# Patient Record
Sex: Female | Born: 1985 | Race: Black or African American | Hispanic: No | Marital: Single | Smoking: Current every day smoker
Health system: Southern US, Community
[De-identification: ages and names within clinical notes are randomized; demographics above are authoritative.]

## PROBLEM LIST (undated history)

## (undated) ENCOUNTER — Inpatient Hospital Stay (HOSPITAL_COMMUNITY): Payer: Self-pay

## (undated) DIAGNOSIS — R519 Headache, unspecified: Secondary | ICD-10-CM

## (undated) DIAGNOSIS — J302 Other seasonal allergic rhinitis: Secondary | ICD-10-CM

## (undated) DIAGNOSIS — B009 Herpesviral infection, unspecified: Secondary | ICD-10-CM

## (undated) DIAGNOSIS — A64 Unspecified sexually transmitted disease: Secondary | ICD-10-CM

## (undated) DIAGNOSIS — R51 Headache: Secondary | ICD-10-CM

## (undated) DIAGNOSIS — D649 Anemia, unspecified: Secondary | ICD-10-CM

## (undated) HISTORY — PX: NO PAST SURGERIES: SHX2092

---
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
## 2004-03-16 ENCOUNTER — Emergency Department (HOSPITAL_COMMUNITY): Admission: EM | Admit: 2004-03-16 | Discharge: 2004-03-16 | Payer: Self-pay | Admitting: Family Medicine

## 2004-07-25 ENCOUNTER — Emergency Department (HOSPITAL_COMMUNITY): Admission: EM | Admit: 2004-07-25 | Discharge: 2004-07-25 | Payer: Self-pay | Admitting: Family Medicine

## 2004-08-11 ENCOUNTER — Emergency Department (HOSPITAL_COMMUNITY): Admission: EM | Admit: 2004-08-11 | Discharge: 2004-08-11 | Payer: Self-pay | Admitting: Family Medicine

## 2005-02-07 ENCOUNTER — Emergency Department (HOSPITAL_COMMUNITY): Admission: EM | Admit: 2005-02-07 | Discharge: 2005-02-07 | Payer: Self-pay | Admitting: Emergency Medicine

## 2005-02-08 ENCOUNTER — Inpatient Hospital Stay (HOSPITAL_COMMUNITY): Admission: AD | Admit: 2005-02-08 | Discharge: 2005-02-08 | Payer: Self-pay | Admitting: Obstetrics and Gynecology

## 2005-06-19 ENCOUNTER — Inpatient Hospital Stay (HOSPITAL_COMMUNITY): Admission: AD | Admit: 2005-06-19 | Discharge: 2005-06-19 | Payer: Self-pay | Admitting: *Deleted

## 2005-07-31 ENCOUNTER — Ambulatory Visit (HOSPITAL_COMMUNITY): Admission: RE | Admit: 2005-07-31 | Discharge: 2005-07-31 | Payer: Self-pay | Admitting: Obstetrics & Gynecology

## 2005-09-03 ENCOUNTER — Inpatient Hospital Stay (HOSPITAL_COMMUNITY): Admission: AD | Admit: 2005-09-03 | Discharge: 2005-09-03 | Payer: Self-pay | Admitting: Obstetrics & Gynecology

## 2005-11-11 LAB — ABO/RH

## 2005-12-07 ENCOUNTER — Inpatient Hospital Stay (HOSPITAL_COMMUNITY): Admission: AD | Admit: 2005-12-07 | Discharge: 2005-12-10 | Payer: Self-pay | Admitting: Obstetrics & Gynecology

## 2007-02-27 ENCOUNTER — Emergency Department (HOSPITAL_COMMUNITY): Admission: EM | Admit: 2007-02-27 | Discharge: 2007-02-28 | Payer: Self-pay | Admitting: Emergency Medicine

## 2008-12-21 ENCOUNTER — Emergency Department (HOSPITAL_COMMUNITY): Admission: EM | Admit: 2008-12-21 | Discharge: 2008-12-21 | Payer: Self-pay | Admitting: Family Medicine

## 2009-05-19 ENCOUNTER — Emergency Department (HOSPITAL_COMMUNITY): Admission: EM | Admit: 2009-05-19 | Discharge: 2009-05-19 | Payer: Self-pay | Admitting: Family Medicine

## 2009-09-10 ENCOUNTER — Emergency Department (HOSPITAL_COMMUNITY): Admission: EM | Admit: 2009-09-10 | Discharge: 2009-09-10 | Payer: Self-pay | Admitting: Emergency Medicine

## 2010-01-18 ENCOUNTER — Emergency Department (HOSPITAL_COMMUNITY): Admission: EM | Admit: 2010-01-18 | Discharge: 2010-01-19 | Payer: Self-pay | Admitting: Emergency Medicine

## 2010-03-16 ENCOUNTER — Emergency Department (HOSPITAL_COMMUNITY)
Admission: EM | Admit: 2010-03-16 | Discharge: 2010-03-16 | Payer: Self-pay | Source: Home / Self Care | Admitting: Emergency Medicine

## 2010-03-18 ENCOUNTER — Emergency Department (HOSPITAL_COMMUNITY)
Admission: EM | Admit: 2010-03-18 | Discharge: 2010-03-19 | Payer: Self-pay | Source: Home / Self Care | Admitting: Emergency Medicine

## 2010-05-24 LAB — URINALYSIS, ROUTINE W REFLEX MICROSCOPIC
Glucose, UA: NEGATIVE mg/dL
Hgb urine dipstick: NEGATIVE
Ketones, ur: NEGATIVE mg/dL
Protein, ur: NEGATIVE mg/dL
pH: 6.5 (ref 5.0–8.0)

## 2010-05-24 LAB — WET PREP, GENITAL
Trich, Wet Prep: NONE SEEN
Yeast Wet Prep HPF POC: NONE SEEN

## 2010-05-24 LAB — POCT PREGNANCY, URINE: Preg Test, Ur: NEGATIVE

## 2010-06-10 ENCOUNTER — Emergency Department (HOSPITAL_COMMUNITY): Payer: Self-pay

## 2010-06-10 ENCOUNTER — Emergency Department (HOSPITAL_COMMUNITY)
Admission: EM | Admit: 2010-06-10 | Discharge: 2010-06-10 | Disposition: A | Discharge status: Home / Self Care | Payer: Self-pay | Attending: Emergency Medicine | Admitting: Emergency Medicine

## 2010-06-10 DIAGNOSIS — M549 Dorsalgia, unspecified: Secondary | ICD-10-CM | POA: Insufficient documentation

## 2010-06-10 DIAGNOSIS — R110 Nausea: Secondary | ICD-10-CM | POA: Insufficient documentation

## 2010-06-10 DIAGNOSIS — F411 Generalized anxiety disorder: Secondary | ICD-10-CM | POA: Insufficient documentation

## 2010-06-10 DIAGNOSIS — Y92009 Unspecified place in unspecified non-institutional (private) residence as the place of occurrence of the external cause: Secondary | ICD-10-CM | POA: Insufficient documentation

## 2010-06-10 DIAGNOSIS — R233 Spontaneous ecchymoses: Secondary | ICD-10-CM | POA: Insufficient documentation

## 2010-06-10 DIAGNOSIS — H9209 Otalgia, unspecified ear: Secondary | ICD-10-CM | POA: Insufficient documentation

## 2010-06-10 DIAGNOSIS — S01309A Unspecified open wound of unspecified ear, initial encounter: Secondary | ICD-10-CM | POA: Insufficient documentation

## 2010-06-10 DIAGNOSIS — IMO0002 Reserved for concepts with insufficient information to code with codable children: Secondary | ICD-10-CM | POA: Insufficient documentation

## 2010-06-10 DIAGNOSIS — R51 Headache: Secondary | ICD-10-CM | POA: Insufficient documentation

## 2010-06-10 DIAGNOSIS — M542 Cervicalgia: Secondary | ICD-10-CM | POA: Insufficient documentation

## 2010-06-10 MED ORDER — IOHEXOL 300 MG/ML  SOLN: 75.0000 mL | Freq: Once | INTRAMUSCULAR | Status: DC | PRN

## 2010-07-29 NOTE — H&P (Signed)
Ellen Camacho, Ellen Camacho              ACCOUNT NO.:  [id number]   MEDICAL RECORD NO.:  [id number]          PATIENT TYPE:  INP   LOCATION:  9174                          FACILITY:  [facility]   PHYSICIAN:  Roseanna Rainbow, M.D.DATE OF BIRTH:  [birth year]   DATE OF ADMISSION:  12/07/2005  DATE OF DISCHARGE:                                HISTORY & PHYSICAL   CHIEF COMPLAINT:  The patient is a 25 year old para 2 with an estimated date  of confinement of December 06, 2005, with an intrauterine pregnancy at 40+  weeks for induction of labor.   HISTORY OF PRESENT ILLNESS:  Please see the above.   ALLERGIES:  No known drug allergies.   MEDICATIONS:  Prenatal vitamins.   RISK FACTORS:  Tobacco use, GBS positive.   PRENATAL LABORATORY:  Please see the above.  Blood type is O, Rh type is  positive, antibody screen negative.  Hemoglobin 11.5; hematocrit 36.2;  platelets 247,000.  Chlamydia negative.  GC negative.  Urine culture and  sensitivity no growth.  One-hour GCT 91.  Hepatitis B surface antigen  negative.  HIV nonreactive.  RPR nonreactive.  Rubella immune.  Sickle cell  negative.  Pap smear within normal limits.   PAST GYNECOLOGICAL HISTORY:  History of chlamydia.   PAST MEDICAL HISTORY:  She denies.   PAST SURGICAL HISTORY:  No previous surgery.   SOCIAL HISTORY:  Unemployed, single.  Does not give any significant history  of alcohol usage.  Smokes less than one-half pack per day, has smoked for 5  years.  Previously has used marijuana.   FAMILY HISTORY:  Breast cancer, hypertension.   PAST OBSTETRICAL HISTORY:  In September 2003 she was delivered of a live-  born female, weight 8 pounds, vaginal delivery, no complications.  In August  2002 she was delivered of a live-born female, 7 pounds 7 ounces, vaginal  delivery, no complications.   PHYSICAL EXAMINATION:  VITAL SIGNS:  Stable, afebrile.  Fetal heart tracing  reassuring.  Tocodynamometer irregular uterine contractions.  PELVIC:  Sterile vaginal exam is per the R.N.  The cervix is fingertip and  50%.   ASSESSMENT:  Multipara at term, unfavorable Bishop's score.  Fetal heart  tracing consistent with fetal well-being.  For induction of labor.  GBS  positive.   PLAN:  Admission, two-stage induction.  Will begin with cervical ripening  with prostaglandins to be followed by Pitocin and AROM.  PCN GBS  prophylaxis.   ADDENDUM TO THE ASSESSMENT:      Roseanna Rainbow, M.D.  Electronically Signed     LAJ/MEDQ  D:  12/08/2005  T:  12/08/2005  Job:  [id number]

## 2010-10-31 ENCOUNTER — Emergency Department (HOSPITAL_COMMUNITY)
Admission: EM | Admit: 2010-10-31 | Discharge: 2010-10-31 | Disposition: A | Discharge status: Home / Self Care | Payer: Medicaid Other | Attending: Emergency Medicine | Admitting: Emergency Medicine

## 2010-10-31 DIAGNOSIS — IMO0001 Reserved for inherently not codable concepts without codable children: Secondary | ICD-10-CM | POA: Insufficient documentation

## 2010-10-31 DIAGNOSIS — J020 Streptococcal pharyngitis: Secondary | ICD-10-CM | POA: Insufficient documentation

## 2010-10-31 DIAGNOSIS — H9209 Otalgia, unspecified ear: Secondary | ICD-10-CM | POA: Insufficient documentation

## 2010-10-31 DIAGNOSIS — R6883 Chills (without fever): Secondary | ICD-10-CM | POA: Insufficient documentation

## 2010-10-31 DIAGNOSIS — R1310 Dysphagia, unspecified: Secondary | ICD-10-CM | POA: Insufficient documentation

## 2010-10-31 DIAGNOSIS — R51 Headache: Secondary | ICD-10-CM | POA: Insufficient documentation

## 2010-10-31 DIAGNOSIS — R070 Pain in throat: Secondary | ICD-10-CM | POA: Insufficient documentation

## 2010-10-31 LAB — RAPID STREP SCREEN (MED CTR MEBANE ONLY): Streptococcus, Group A Screen (Direct): POSITIVE — AB

## 2010-12-16 LAB — URINALYSIS, ROUTINE W REFLEX MICROSCOPIC
Bilirubin Urine: NEGATIVE
Ketones, ur: NEGATIVE
Nitrite: NEGATIVE
Specific Gravity, Urine: 1.011
Urobilinogen, UA: 0.2
pH: 6.5

## 2010-12-16 LAB — URINE MICROSCOPIC-ADD ON

## 2010-12-16 LAB — URINE CULTURE

## 2010-12-16 LAB — PREGNANCY, URINE: Preg Test, Ur: NEGATIVE

## 2011-03-24 ENCOUNTER — Encounter (HOSPITAL_COMMUNITY): Payer: Self-pay

## 2011-03-24 ENCOUNTER — Emergency Department (INDEPENDENT_AMBULATORY_CARE_PROVIDER_SITE_OTHER)
Admission: EM | Admit: 2011-03-24 | Discharge: 2011-03-24 | Disposition: A | Discharge status: Home / Self Care | Payer: Self-pay | Source: Home / Self Care | Attending: Family Medicine | Admitting: Family Medicine

## 2011-03-24 DIAGNOSIS — K612 Anorectal abscess: Secondary | ICD-10-CM

## 2011-03-24 DIAGNOSIS — K610 Anal abscess: Secondary | ICD-10-CM

## 2011-03-24 LAB — CULTURE, ROUTINE-ABSCESS

## 2011-03-24 MED ORDER — CLINDAMYCIN HCL 150 MG PO CAPS: 150.0000 mg | ORAL_CAPSULE | Freq: Four times a day (QID) | ORAL | Status: AC

## 2011-03-24 MED ORDER — OXYCODONE HCL 5 MG PO TABS: 5.0000 mg | ORAL_TABLET | Freq: Four times a day (QID) | ORAL | Status: AC | PRN

## 2011-03-24 MED ORDER — PRENATAL COMPLETE 14-0.4 MG PO TABS: 1.0000 | ORAL_TABLET | Freq: Every day | ORAL | Status: DC

## 2011-03-24 NOTE — ED Notes (Signed)
Hist of previous boil on buttocks, started having pain in same area; reddened, swollen, painful to palpation

## 2011-03-31 NOTE — ED Notes (Signed)
Abscess culture perirectal: Few streptococcus group C- no sensitivity on report. Pt. treated with Cleocin.  1/17  Lab shown to Dr. Ladon Applebaum and he said it was contaminant and tx. is adequate.  No further action needed. Ellen Camacho 03/31/2011

## 2011-04-25 NOTE — ED Provider Notes (Signed)
History     CSN: [id number]  Arrival date & time 03/24/11  1647   First MD Initiated Contact with Patient 03/24/11 1703      Chief Complaint  Patient presents with  . Recurrent Skin Infections    (Consider location/radiation/quality/duration/timing/severity/associated sxs/prior treatment) Patient is a 26 y.o. female presenting with abscess. The history is provided by the patient.  Abscess  This is a new problem. The current episode started today. The problem has been unchanged. The abscess is present on the left buttock.    History reviewed. No pertinent past medical history.  History reviewed. No pertinent past surgical history.  History reviewed. No pertinent family history.  History  Substance Use Topics  . Smoking status: Not on file  . Smokeless tobacco: Not on file  . Alcohol Use:     OB History    Grav Para Term Preterm Abortions TAB SAB Ect Mult Living                  Review of Systems  Allergies  Review of patient's allergies indicates no known allergies.  Home Medications   Current Outpatient Rx  Name Route Sig Dispense Refill  . PRENATAL COMPLETE 14-0.4 MG PO TABS Oral Take 1 tablet by mouth daily. 30 each 0    BP 121/81  Pulse 99  Temp(Src) 98.9 F (37.2 C) (Oral)  Resp 18  SpO2 100%  LMP 02/09/2011  Physical Exam  Nursing note and vitals reviewed. Constitutional: She is oriented to person, place, and time. She appears well-developed and well-nourished.  HENT:  Head: Normocephalic and atraumatic.  Eyes: EOM are normal.  Neck: Normal range of motion.  Pulmonary/Chest: Effort normal.  Genitourinary:     Musculoskeletal: Normal range of motion.  Neurological: She is alert and oriented to person, place, and time.  Skin: Skin is warm and dry.  Psychiatric: Her behavior is normal.    ED Course  INCISION AND DRAINAGE Performed by: Richardo Priest Authorized by: Richardo Priest Consent: Verbal consent obtained. Consent given by:  patient Patient understanding: patient states understanding of the procedure being performed Type: abscess Body area: anogenital Location details: perianal Anesthesia: local infiltration Local anesthetic: lidocaine 2% without epinephrine Anesthetic total: 3 ml Scalpel size: 11 Needle gauge: 27. Incision type: single straight Complexity: simple Drainage: purulent Drainage amount: moderate Packing material: 1/4 in iodoform gauze   (including critical care time)   Labs Reviewed  POCT PREGNANCY, URINE  CULTURE, ROUTINE-ABSCESS  LAB REPORT - SCANNED   No results found.   1. Perianal abscess       MDM  S/p I&D; start antibiotics, return for re-evaluation in 48 hours for evaluation        Richardo Priest, MD 04/25/11 1904

## 2011-05-01 ENCOUNTER — Inpatient Hospital Stay (HOSPITAL_COMMUNITY)
Admission: AD | Admit: 2011-05-01 | Discharge: 2011-05-01 | Disposition: A | Discharge status: Home / Self Care | Payer: Medicaid Other | Source: Ambulatory Visit | Attending: Family Medicine | Admitting: Family Medicine

## 2011-05-01 ENCOUNTER — Encounter (HOSPITAL_COMMUNITY): Payer: Self-pay | Admitting: *Deleted

## 2011-05-01 DIAGNOSIS — N912 Amenorrhea, unspecified: Secondary | ICD-10-CM | POA: Insufficient documentation

## 2011-05-01 DIAGNOSIS — Z3201 Encounter for pregnancy test, result positive: Secondary | ICD-10-CM | POA: Insufficient documentation

## 2011-05-01 DIAGNOSIS — Z3200 Encounter for pregnancy test, result unknown: Secondary | ICD-10-CM

## 2011-05-01 HISTORY — DX: Anemia, unspecified: D64.9

## 2011-05-01 LAB — URINE MICROSCOPIC-ADD ON

## 2011-05-01 LAB — CBC
MCH: 28.4 pg (ref 26.0–34.0)
MCHC: 33.7 g/dL (ref 30.0–36.0)
Platelets: 220 10*3/uL (ref 150–400)
RDW: 14.3 % (ref 11.5–15.5)

## 2011-05-01 LAB — URINALYSIS, ROUTINE W REFLEX MICROSCOPIC
Bilirubin Urine: NEGATIVE
Ketones, ur: NEGATIVE mg/dL
Leukocytes, UA: NEGATIVE
Nitrite: NEGATIVE
Protein, ur: NEGATIVE mg/dL
Urobilinogen, UA: 0.2 mg/dL (ref 0.0–1.0)

## 2011-05-01 LAB — COMPREHENSIVE METABOLIC PANEL
ALT: 18 U/L (ref 0–35)
Albumin: 3.7 g/dL (ref 3.5–5.2)
Calcium: 9.3 mg/dL (ref 8.4–10.5)
GFR calc Af Amer: >90 mL/min (ref >90)
Glucose, Bld: 110 mg/dL — ABNORMAL HIGH (ref 70–99)
Potassium: 4 mEq/L (ref 3.5–5.1)
Sodium: 133 mEq/L — ABNORMAL LOW (ref 135–145)
Total Protein: 7.2 g/dL (ref 6.0–8.3)

## 2011-05-01 MED ORDER — CLINDAMYCIN HCL 300 MG PO CAPS: 300.0000 mg | ORAL_CAPSULE | Freq: Three times a day (TID) | ORAL | Status: AC

## 2011-05-01 NOTE — Progress Notes (Signed)
S:  Upon discharge pt reports a "boil" in area of buttocks that opened yesterday O: Small open lesion on left buttocks; no redness seen around, no abnormal discharge; healing over A:  Boil P:  RX Clindamycin 300 mg TID X 7 days  F/U if no improvement of symptoms. [facility]

## 2011-05-01 NOTE — ED Provider Notes (Signed)
History     Chief Complaint  Patient presents with  . Amenorrhea   HPI  Pt in to get pregnancy verification letter. Also reports one episode of "blacking out" today.  While watching TV, felt nauseous, when opening door to bathroom became really hot feeling and slid down to floor.  Felt better after drinking water.  Reports nausea since finding out pregnant, no vomiting today.  Last vomiting was 2 days ago. Denies vaginal bleeding or abdominal pain.  No abnormal vaginal discharge reported.     Past Medical History  Diagnosis Date  . Anemia     Past Surgical History  Procedure Date  . No past surgeries     Family History  Problem Relation Age of Onset  . Cancer Mother   . Diabetes Mother   . Hypertension Mother   . Heart disease Mother   . Stroke Brother   . Cancer Maternal Grandfather     History  Substance Use Topics  . Smoking status: Current Everyday Smoker    Types: Cigarettes  . Smokeless tobacco: Not on file  . Alcohol Use: No    Allergies: No Known Allergies  No prescriptions prior to admission    Review of Systems  Neurological: Positive for weakness.  All other systems reviewed and are negative.   Physical Exam   Blood pressure 121/71, pulse 97, temperature 98.4 F (36.9 C), temperature source Oral, resp. rate 20, height 5\' 6"  (1.676 m), weight 55.792 kg (123 lb), last menstrual period 02/08/2011, SpO2 100.00%.  Physical Exam  Constitutional: She is oriented to person, place, and time. She appears well-developed and well-nourished. No distress.  HENT:  Head: Normocephalic.  Neck: Normal range of motion. Neck supple.  Cardiovascular: Normal rate, regular rhythm and normal heart sounds.   Respiratory: Effort normal and breath sounds normal. No respiratory distress.  GI: Soft. There is no tenderness. There is no CVA tenderness.  Genitourinary: Uterus is enlarged. Cervix exhibits no motion tenderness and no discharge.       Fundal height 3 above  pelvis  Musculoskeletal: Normal range of motion.  Neurological: She is alert and oriented to person, place, and time.  Skin: Skin is warm and dry.  Psychiatric: She has a normal mood and affect.   Doppler FHT's MAU Course  Procedures  Results for orders placed during the hospital encounter of 05/01/11 (from the past 24 hour(s))  URINALYSIS, ROUTINE W REFLEX MICROSCOPIC     Status: Abnormal   Collection Time   05/01/11  3:05 PM      Component Value Range   Color, Urine YELLOW  YELLOW    APPearance HAZY (*) CLEAR    Specific Gravity, Urine 1.025  1.005 - 1.030    pH 6.0  5.0 - 8.0    Glucose, UA NEGATIVE  NEGATIVE (mg/dL)   Hgb urine dipstick TRACE (*) NEGATIVE    Bilirubin Urine NEGATIVE  NEGATIVE    Ketones, ur NEGATIVE  NEGATIVE (mg/dL)   Protein, ur NEGATIVE  NEGATIVE (mg/dL)   Urobilinogen, UA 0.2  0.0 - 1.0 (mg/dL)   Nitrite NEGATIVE  NEGATIVE    Leukocytes, UA NEGATIVE  NEGATIVE   URINE MICROSCOPIC-ADD ON     Status: Abnormal   Collection Time   05/01/11  3:05 PM      Component Value Range   Squamous Epithelial / LPF FEW (*) RARE    WBC, UA 0-2  <3 (WBC/hpf)   RBC / HPF 7-10  <3 (RBC/hpf)  Bacteria, UA FEW (*) RARE    Crystals CA OXALATE CRYSTALS (*) NEGATIVE    Urine-Other MUCOUS PRESENT    CBC     Status: Abnormal   Collection Time   05/01/11  4:10 PM      Component Value Range   WBC 10.7 (*) 4.0 - 10.5 (K/uL)   RBC 4.19  3.87 - 5.11 (MIL/uL)   Hemoglobin 11.9 (*) 12.0 - 15.0 (g/dL)   HCT 16.1 (*) 09.6 - 46.0 (%)   MCV 84.2  78.0 - 100.0 (fL)   MCH 28.4  26.0 - 34.0 (pg)   MCHC 33.7  30.0 - 36.0 (g/dL)   RDW 04.5  40.9 - 81.1 (%)   Platelets 220  150 - 400 (K/uL)  COMPREHENSIVE METABOLIC PANEL     Status: Abnormal   Collection Time   05/01/11  4:10 PM      Component Value Range   Sodium 133 (*) 135 - 145 (mEq/L)   Potassium 4.0  3.5 - 5.1 (mEq/L)   Chloride 99  96 - 112 (mEq/L)   CO2 25  19 - 32 (mEq/L)   Glucose, Bld 110 (*) 70 - 99 (mg/dL)   BUN 12  6  - 23 (mg/dL)   Creatinine, Ser 9.14  0.50 - 1.10 (mg/dL)   Calcium 9.3  8.4 - 78.2 (mg/dL)   Total Protein 7.2  6.0 - 8.3 (g/dL)   Albumin 3.7  3.5 - 5.2 (g/dL)   AST 18  0 - 37 (U/L)   ALT 18  0 - 35 (U/L)   Alkaline Phosphatase 59  39 - 117 (U/L)   Total Bilirubin 0.2 (*) 0.3 - 1.2 (mg/dL)   GFR calc non Af Amer >90  >90 (mL/min)   GFR calc Af Amer >90  >90 (mL/min)     Assessment and Plan  Pregnancy  Plan: -DC to home -Begin prenatal care   [facility] 05/01/2011, 3:54 PM

## 2011-05-01 NOTE — Progress Notes (Signed)
Pt in to get pregnancy verification letter.  Also reports one episode of "blacking out" today.  Reports nausea since finding out pregnant, no vomiting 2 days ago.  Hit back on chair, but did slide down to floor.  Has had one episode of blacking out before but 9 years ago, but was not pregnant.  Hx of anemia, no bleeding.

## 2011-05-01 NOTE — ED Notes (Signed)
When called pt from lobby.  Was leaning over picking things up off floor, gathering belongings. Steady on feet, did not reach out for assistance or to steady self, gait smooth and steady.

## 2011-05-01 NOTE — Progress Notes (Signed)
In Jan, went to urgent care for a boil- found out was preg.  Boil was taken care of, went to get medicaid switched over and was told need proof of preg  With edc.  Not first preg. Has been dizzy at times, actually passed out today, every thing went black.. Was walking reached for door, things started going so sat down on floor, when came to was laying on the floor.  Got up drank some water and was fine.  Passed out several years ago, when not preg.

## 2011-05-01 NOTE — Discharge Instructions (Signed)
Prenatal Care Providers °[facility]    [facility] ° Phone- [phone]     Phone: [phone] °         °[facility]                      [facility] ° @[place]     Phone: [phone] ° Phone: [phone] °        [facility] °[facility]     Phone: [phone] ° Phone: [phone]   °        [facility] °[facility] [place]                hone: [phone] ° Phone: [phone] °        [facility] °Dr. Bernard Marshall      Phone: [phone] ° Phone: [phone] °        [facility] °[facility]                Phone: [phone] ° [facility]  ° Phone:[phone]    [facility] °         Phone: [phone] °[facility] °  Phone: [phone] °

## 2011-05-02 NOTE — ED Provider Notes (Signed)
Chart reviewed and agree with management and plan.  

## 2011-05-13 ENCOUNTER — Encounter (HOSPITAL_COMMUNITY): Payer: Self-pay

## 2011-05-13 ENCOUNTER — Inpatient Hospital Stay (HOSPITAL_COMMUNITY)
Admission: AD | Admit: 2011-05-13 | Discharge: 2011-05-13 | Disposition: A | Discharge status: Home Health | Payer: Medicaid Other | Source: Ambulatory Visit | Attending: Obstetrics & Gynecology | Admitting: Obstetrics & Gynecology

## 2011-05-13 DIAGNOSIS — R109 Unspecified abdominal pain: Secondary | ICD-10-CM | POA: Insufficient documentation

## 2011-05-13 DIAGNOSIS — O209 Hemorrhage in early pregnancy, unspecified: Secondary | ICD-10-CM | POA: Insufficient documentation

## 2011-05-13 DIAGNOSIS — O26859 Spotting complicating pregnancy, unspecified trimester: Secondary | ICD-10-CM

## 2011-05-13 LAB — URINALYSIS, ROUTINE W REFLEX MICROSCOPIC
Glucose, UA: NEGATIVE mg/dL
Leukocytes, UA: NEGATIVE
Protein, ur: NEGATIVE mg/dL
Specific Gravity, Urine: 1.025 (ref 1.005–1.030)
pH: 6 (ref 5.0–8.0)

## 2011-05-13 LAB — WET PREP, GENITAL: Trich, Wet Prep: NONE SEEN

## 2011-05-13 LAB — URINE MICROSCOPIC-ADD ON

## 2011-05-13 MED ORDER — METRONIDAZOLE 500 MG PO TABS: 500.0000 mg | ORAL_TABLET | Freq: Two times a day (BID) | ORAL | Status: AC

## 2011-05-13 NOTE — Progress Notes (Signed)
Patient is here with c/o sudden onset of lower abdominal cramping and vaginal bleeding that started about an hour.

## 2011-05-13 NOTE — Progress Notes (Signed)
Pt states, " Less than an hour ago I went to the bathroom and wiped and saw blood on the paper. I had low back pain all day and now it has moved to my low abdomen and into my upper thighs.'

## 2011-05-13 NOTE — ED Provider Notes (Signed)
History     Chief Complaint  Patient presents with  . Vaginal Bleeding  . Abdominal Pain   HPI 26 y.o. [id number] at [redacted]w[redacted]d with onset of cramping and vaginal bleeding about 1 hour ago, at first patient reports "a lot" of bleeding with wiping, now states bleeding is lighter. No recent intercourse. Blood type O pos by prior records.    Past Medical History  Diagnosis Date  . Anemia     Past Surgical History  Procedure Date  . No past surgeries     Family History  Problem Relation Age of Onset  . Cancer Mother   . Diabetes Mother   . Hypertension Mother   . Heart disease Mother   . Stroke Brother   . Cancer Maternal Grandfather     History  Substance Use Topics  . Smoking status: Current Everyday Smoker    Types: Cigarettes  . Smokeless tobacco: Not on file  . Alcohol Use: No    Allergies: No Known Allergies  No prescriptions prior to admission    Review of Systems  Constitutional: Negative.   Respiratory: Negative.   Cardiovascular: Negative.   Gastrointestinal: Negative for nausea, vomiting, abdominal pain, diarrhea and constipation.  Genitourinary: Negative for dysuria, urgency, frequency, hematuria and flank pain.       Positive for cramping and bleeding  Musculoskeletal: Negative.   Neurological: Negative.   Psychiatric/Behavioral: Negative.    Physical Exam   Blood pressure 106/70, pulse 88, temperature 98.4 F (36.9 C), temperature source Oral, resp. rate 2, height 5' 5.25" (1.657 m), weight 129 lb (58.514 kg), last menstrual period 02/08/2011.  Physical Exam  Nursing note and vitals reviewed. Constitutional: She is oriented to person, place, and time. She appears well-developed and well-nourished. No distress.  HENT:  Head: Normocephalic and atraumatic.  Cardiovascular: Normal rate.   Respiratory: Effort normal.  GI: Soft. Bowel sounds are normal. She exhibits no mass. There is no tenderness. There is no rebound and no guarding.  Genitourinary:  There is no rash or lesion on the right labia. There is no rash or lesion on the left labia. Uterus is not tender. Enlarged: Size c/w dates. Cervix exhibits no motion tenderness, no discharge and no friability. Right adnexum displays no mass, no tenderness and no fullness. Left adnexum displays no mass, no tenderness and no fullness. There is bleeding (small) around the vagina. No tenderness around the vagina. No vaginal discharge found.       Cervix long and closed  Musculoskeletal: Normal range of motion.  Neurological: She is alert and oriented to person, place, and time.  Skin: Skin is warm and dry.  Psychiatric: She has a normal mood and affect.    MAU Course  Procedures  Results for orders placed during the hospital encounter of 05/13/11 (from the past 24 hour(s))  URINALYSIS, ROUTINE W REFLEX MICROSCOPIC     Status: Abnormal   Collection Time   05/13/11  1:54 AM      Component Value Range   Color, Urine YELLOW  YELLOW    APPearance CLEAR  CLEAR    Specific Gravity, Urine 1.025  1.005 - 1.030    pH 6.0  5.0 - 8.0    Glucose, UA NEGATIVE  NEGATIVE (mg/dL)   Hgb urine dipstick LARGE (*) NEGATIVE    Bilirubin Urine NEGATIVE  NEGATIVE    Ketones, ur NEGATIVE  NEGATIVE (mg/dL)   Protein, ur NEGATIVE  NEGATIVE (mg/dL)   Urobilinogen, UA 0.2  0.0 - 1.0 (  mg/dL)   Nitrite NEGATIVE  NEGATIVE    Leukocytes, UA NEGATIVE  NEGATIVE   URINE MICROSCOPIC-ADD ON     Status: Abnormal   Collection Time   05/13/11  1:54 AM      Component Value Range   Squamous Epithelial / LPF FEW (*) RARE    WBC, UA 3-6  <3 (WBC/hpf)   RBC / HPF 7-10  <3 (RBC/hpf)   Bacteria, UA FEW (*) RARE   WET PREP, GENITAL     Status: Abnormal   Collection Time   05/13/11  2:15 AM      Component Value Range   Yeast Wet Prep HPF POC NONE SEEN  NONE SEEN    Trich, Wet Prep NONE SEEN  NONE SEEN    Clue Cells Wet Prep HPF POC FEW (*) NONE SEEN    WBC, Wet Prep HPF POC FEW (*) NONE SEEN      Assessment and Plan  26 y.o.  [id number] at [redacted]w[redacted]d Rx flagyl F/U for prenatal care as soon as possible   Demarco Bacci 05/13/2011, 2:38 AM

## 2011-05-15 LAB — GC/CHLAMYDIA PROBE AMP, GENITAL
Chlamydia, DNA Probe: NEGATIVE
GC Probe Amp, Genital: NEGATIVE

## 2011-05-21 ENCOUNTER — Inpatient Hospital Stay (HOSPITAL_COMMUNITY): Payer: Medicaid Other

## 2011-05-21 ENCOUNTER — Encounter (HOSPITAL_COMMUNITY): Payer: Self-pay | Admitting: *Deleted

## 2011-05-21 ENCOUNTER — Inpatient Hospital Stay (HOSPITAL_COMMUNITY)
Admission: AD | Admit: 2011-05-21 | Discharge: 2011-05-22 | Disposition: A | Discharge status: Home Health | Payer: Medicaid Other | Source: Ambulatory Visit | Attending: Obstetrics and Gynecology | Admitting: Obstetrics and Gynecology

## 2011-05-21 DIAGNOSIS — O4690 Antepartum hemorrhage, unspecified, unspecified trimester: Secondary | ICD-10-CM

## 2011-05-21 DIAGNOSIS — O209 Hemorrhage in early pregnancy, unspecified: Secondary | ICD-10-CM | POA: Insufficient documentation

## 2011-05-21 NOTE — Progress Notes (Signed)
Pt G4 P3 at 14.4wks, having vag bleeding, cramping and back pain x 3 days.  Pt taking flagyl  Since 3/4.

## 2011-05-21 NOTE — ED Provider Notes (Signed)
History   Pt presents today c/o heavier vag bleeding that has worsened over the past 3 days. She denies recent intercourse. She has no other complaints at this time.  Chief Complaint  Patient presents with  . Vaginal Bleeding  . Abdominal Cramping   HPI  OB History    Grav Para Term Preterm Abortions TAB SAB Ect Mult Living   4 3 3  0 0 0 0 0 0 3      Past Medical History  Diagnosis Date  . Anemia     Past Surgical History  Procedure Date  . No past surgeries     Family History  Problem Relation Age of Onset  . Cancer Mother   . Diabetes Mother   . Hypertension Mother   . Heart disease Mother   . Stroke Brother   . Cancer Maternal Grandfather     History  Substance Use Topics  . Smoking status: Current Everyday Smoker    Types: Cigarettes  . Smokeless tobacco: Not on file  . Alcohol Use: No    Allergies: No Known Allergies  Prescriptions prior to admission  Medication Sig Dispense Refill  . metroNIDAZOLE (FLAGYL) 500 MG tablet Take 1 tablet (500 mg total) by mouth 2 (two) times daily.  14 tablet  0    Review of Systems  Constitutional: Negative for fever and chills.  Eyes: Negative for blurred vision and double vision.  Respiratory: Negative for cough, hemoptysis, sputum production, shortness of breath and wheezing.   Cardiovascular: Negative for chest pain and palpitations.  Gastrointestinal: Positive for abdominal pain. Negative for nausea, vomiting, diarrhea, constipation and blood in stool.  Genitourinary: Negative for dysuria, urgency, frequency, hematuria and flank pain.  Neurological: Negative for dizziness and headaches.  Psychiatric/Behavioral: Negative for depression and suicidal ideas.   Physical Exam   Blood pressure 93/50, pulse 89, temperature 98.6 F (37 C), temperature source Oral, resp. rate 16, height 5\' 6"  (1.676 m), weight 133 lb (60.328 kg), last menstrual period 02/08/2011.  Physical Exam  Nursing note and vitals  reviewed. Constitutional: She is oriented to person, place, and time. She appears well-developed and well-nourished. No distress.  HENT:  Head: Atraumatic.  GI: Soft. She exhibits no distension and no mass. There is tenderness. There is no rebound and no guarding.  Genitourinary: There is bleeding around the vagina. Vaginal discharge found.       Moderate amount of dark red blood in vag vault. Cervix Lg/closed.  Neurological: She is alert and oriented to person, place, and time.  Skin: Skin is warm and dry. She is not diaphoretic.  Psychiatric: She has a normal mood and affect. Her behavior is normal. Judgment and thought content normal.    MAU Course  Procedures  US shows small to moderate subchorionic hemorrhage. Complex amniotic fluid noted and amniotic fluid is subjectively low-normal. Also fetus with echogenic bowel.  Discussed pt with Dr. Emelda Fear. Advised pt should have f/u scheduled. No treatment at this time.  Assessment and Plan  Vag bleeding: will gave pt f/u in the high risk OB clinic. She will be called with an appt. Discussed diet, activity, risks, and precautions. Discussed all findings with pt and her mother at length.   Clinton Gallant. Zen Cedillos III, DrHSc, MPAS, PA-C  05/21/2011, 11:08 PM   Harmon Pier, PA 05/22/11 0030

## 2011-05-21 NOTE — Progress Notes (Signed)
Pt c/o vag bleeding for the last three days with mod sized clots and low abd cramping

## 2011-05-22 NOTE — Discharge Instructions (Signed)
Vaginal Bleeding During Pregnancy, Second Trimester  A small amount of bleeding (spotting) is relatively common in pregnancy. It usually stops on its own. There are many causes for bleeding or spotting in pregnancy. Some bleeding may be related to the pregnancy and some may not. Cramping with the bleeding is more serious and concerning. Tell your caregiver if you have any vaginal bleeding.   CAUSES    Infection, inflammation or growths on the cervix.   The placenta may partially or completely be covering the opening of the cervix inside the uterus.   The placenta may have separated from the uterus.   You may be having early/preterm labor.   The cervix is not strong enough to keep a baby inside the uterus (cervical insufficiency).   Many tiny cysts in the uterus instead of pregnancy tissue (molar pregnancy)  SYMPTOMS    Vaginal spotting or bleeding with or without cramps.   Uterine contractions.   Abnormal vaginal discharge.   You may have spotting or spotting after having sexual intercourse.  DIAGNOSIS   To evaluate the pregnancy, your caregiver may:   Do a pelvic exam.   Take blood tests.   Do an ultrasound.  It is very important to follow your caregiver's instructions.   TREATMENT    Evaluation of the pregnancy with blood tests and ultrasound.   Bed rest (getting up to use the bathroom only).   Rho-gam immunization if the mother is Rh negative and the father is Rh positive.   If you are having uterine contractions, you may be given medication to stop the contractions.   If you have cervical insufficiency, you may have a suture placed in the cervix to close it.  HOME CARE INSTRUCTIONS    If your caregiver orders bed rest, you may need to make arrangements for the care of other children and for any other responsibilities. However, your caregiver may allow you to continue light activity.   Keep track of the number of pads you use each day and how soaked (saturated) they are. Write this down.   Do  not use tampons. Do not douche.   Do not have sexual intercourse or orgasms until approved by your physician.   Save any tissue that you pass for your caregiver to see.   Take medicine for cramps only with your caregiver's permission.   Do not take aspirin because it can make you bleed.   Do not exercise, do any strenuous activities or heavy lifting without your caregiver's permission.  SEEK IMMEDIATE MEDICAL CARE IF:    You experience severe cramps in your stomach, back or belly (abdomen).   You have uterine contractions.   You have an oral temperature above 102 F (38.9 C), not controlled by medicine.   You develop chills.   You pass large clots or tissue.   Your bleeding increases or you become light-headed, weak or have fainting episodes.   You have leaking or a gush of fluid from your vagina.  Document Released: 12/07/2004 Document Revised: 02/16/2011 Document Reviewed: 06/18/2008  [employer] Patient Information 2012 [employer].

## 2011-05-27 ENCOUNTER — Encounter (HOSPITAL_COMMUNITY): Payer: Self-pay | Admitting: *Deleted

## 2011-05-27 ENCOUNTER — Inpatient Hospital Stay (HOSPITAL_COMMUNITY)
Admission: AD | Admit: 2011-05-27 | Discharge: 2011-05-27 | Disposition: A | Discharge status: Home / Self Care | Payer: Medicaid Other | Source: Ambulatory Visit | Attending: Family Medicine | Admitting: Family Medicine

## 2011-05-27 DIAGNOSIS — O039 Complete or unspecified spontaneous abortion without complication: Secondary | ICD-10-CM

## 2011-05-27 HISTORY — DX: Unspecified sexually transmitted disease: A64

## 2011-05-27 LAB — CBC
Hemoglobin: 11.5 g/dL — ABNORMAL LOW (ref 12.0–15.0)
MCHC: 33.6 g/dL (ref 30.0–36.0)
RDW: 14.3 % (ref 11.5–15.5)
WBC: 13.7 10*3/uL — ABNORMAL HIGH (ref 4.0–10.5)

## 2011-05-27 MED ORDER — FENTANYL CITRATE 0.05 MG/ML IJ SOLN
50.0000 ug | INTRAMUSCULAR | Status: DC | PRN
  Administered 2011-05-27: 50 ug via INTRAVENOUS
  Filled 2011-05-27: qty 2

## 2011-05-27 MED ORDER — OXYCODONE-ACETAMINOPHEN 5-325 MG PO TABS
2.0000 | ORAL_TABLET | Freq: Once | ORAL | Status: AC
  Administered 2011-05-27: 2 via ORAL
  Filled 2011-05-27: qty 2

## 2011-05-27 MED ORDER — MISOPROSTOL 200 MCG PO TABS
1000.0000 ug | ORAL_TABLET | Freq: Once | ORAL | Status: AC
  Administered 2011-05-27: 1000 ug via VAGINAL
  Filled 2011-05-27: qty 5

## 2011-05-27 MED ORDER — OXYTOCIN 20 UNITS IN LACTATED RINGERS INFUSION - SIMPLE
500.0000 mL/h | INTRAVENOUS | Status: DC
  Administered 2011-05-27: 500 mL/h via INTRAVENOUS
  Filled 2011-05-27: qty 1000

## 2011-05-27 NOTE — ED Notes (Signed)
Sandrea Hughs RN here with camera, and assisting with pictures. ( Will burn onto CD for pt)

## 2011-05-27 NOTE — ED Provider Notes (Addendum)
@  MAUPATCONTACT@  Chief Complaint:  Vaginal Bleeding and Emesis   Ellen Camacho is  26 y.o. [phone] at [redacted]w[redacted]d presents complaining of Vaginal Bleeding and Emesis She has been seen several times over the past week for vaginal bleeding.  Today, the bleeding got heavier, she felt crampy, and then became nauseated.  Upon vomiting at 2:45, she felt the fetus come out.  Upon arrival, the fetus is still attatched to the umbilical cord, which is still attatched to the placenta.  Vaginal bleeding now is minimal.   Obstetrical/Gynecological History: Menstrual History: OB History    Grav Para Term Preterm Abortions TAB SAB Ect Mult Living   4 3 3  0 0 0 0 0 0 3     Patient's last menstrual period was 02/08/2011.     Past Medical History: Past Medical History  Diagnosis Date  . Anemia   . STD (female)     Past Surgical History: Past Surgical History  Procedure Date  . No past surgeries     Family History: Family History  Problem Relation Age of Onset  . Cancer Mother   . Diabetes Mother   . Hypertension Mother   . Heart disease Mother   . Stroke Brother   . Cancer Maternal Grandfather     Social History: History  Substance Use Topics  . Smoking status: Current Everyday Smoker    Types: Cigarettes  . Smokeless tobacco: Not on file  . Alcohol Use: No    Allergies: No Known Allergies  Meds:  Prescriptions prior to admission  Medication Sig Dispense Refill  . acetaminophen (TYLENOL) 500 MG tablet Take 500 mg by mouth every 6 (six) hours as needed. pain      . Prenatal Vit-Fe Fumarate-FA (PRENATAL MULTIVITAMIN) TABS Take 1 tablet by mouth daily.        Review of Systems - Please refer to the aforementioned patients' reports.     Physical Exam  Blood pressure 145/90, temperature 100.3 F (37.9 C), temperature source Oral, resp. rate 16, height 5\' 6"  (1.676 m), weight 128 lb 12.8 oz (58.423 kg), last menstrual period 02/08/2011. GENERAL: Well-developed, well-nourished  female in no acute distress.  LUNGS: Clear to auscultation bilaterally.  HEART: Regular rate and rhythm. ABDOMEN: Soft, nontender, nondistended, gravid.  EXTREMITIES: Nontender, no edema, 2+ distal pulses. CERVICAL EXAM: Dilatation 4cm   Effacement 0%       SSE:  Small amount of clots and blood cleaned out from vagina.  Fetus detatched from umbilical cord.  Placenta still attatched Labs: Blood type O+  Imaging Studies:    Assessment: Ellen Camacho is  26 y.o. [phone] at [redacted]w[redacted]d presents with incomplete AB  Plan: At this point, will try Cytotec and IV with Pitocin   2009:  Pt passed what appears to be an intact placenta ~ 1930.  Bleeding minimal, pt feels well.  Pt desires to f/u with [facility] for post  SAB check and to begin Depo  Ellen Camacho,Ellen Camacho 3/16/20134:50 PM
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
## 2011-05-27 NOTE — MAU Note (Signed)
Vomiting since yesterday this morning felt okay, had light bleeding yesterday, had more bleeding today thinks passed something, having some cramping.

## 2011-05-27 NOTE — MAU Note (Signed)
Pt was up in room doing pericare and tol well.

## 2011-05-27 NOTE — MAU Note (Signed)
Marylee Floras CNM in, fetus separated from cord. Pt veiewing fetus . Emotional support given.

## 2011-05-27 NOTE — ED Notes (Signed)
Small amt of vaginal bleeding present with low abdominal cramping. Placenta has not been passed. Pt on phone with family. Fentanyl (1 ml) pushed IV for pain control. Friend at bedside.

## 2011-05-27 NOTE — ED Notes (Signed)
Discussed comfort with patient and pictures offered. Pt states that she would like pictures.

## 2011-05-27 NOTE — MAU Note (Signed)
Drenda Freeze CNM was in to see pt at 2000, not 2047 as previously noted

## 2011-05-27 NOTE — MAU Note (Signed)
Drenda Freeze Cres-Dishmon CNM in to see pt and discussing d/c home

## 2011-05-27 NOTE — MAU Note (Signed)
Arlys John RN spoke with Dr Erlene Senters. Ok to decrease Pitocin infusion to 100cc/hr.

## 2011-05-27 NOTE — MAU Note (Signed)
Pt requested pain med before leaving. Drenda Freeze CNM notified and orders received

## 2011-05-27 NOTE — ED Provider Notes (Signed)
Chart reviewed and agree with management and plan.  

## 2011-05-27 NOTE — MAU Note (Signed)
1925 Pt passed large clot and then placenta. Cramping much better afterward and bleeding minimal.
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
## 2011-05-28 NOTE — ED Provider Notes (Signed)
Chart reviewed and agree with management and plan.  

## 2011-06-02 NOTE — ED Provider Notes (Signed)
Attestation of Attending Supervision of Advanced Practitioner: Evaluation and management procedures were performed by the PA/NP/CNM/OB Fellow under my supervision/collaboration. Chart reviewed and agree with management and plan.  Tahara Ruffini V 06/02/2011 5:03 PM    

## 2011-06-12 DEATH — deceased

## 2012-06-04 ENCOUNTER — Emergency Department (HOSPITAL_COMMUNITY)
Admission: EM | Admit: 2012-06-04 | Discharge: 2012-06-04 | Disposition: A | Discharge status: Home / Self Care | Payer: Self-pay | Attending: Emergency Medicine | Admitting: Emergency Medicine

## 2012-06-04 ENCOUNTER — Encounter (HOSPITAL_COMMUNITY): Payer: Self-pay | Admitting: *Deleted

## 2012-06-04 ENCOUNTER — Emergency Department (HOSPITAL_COMMUNITY): Payer: Self-pay

## 2012-06-04 DIAGNOSIS — F17200 Nicotine dependence, unspecified, uncomplicated: Secondary | ICD-10-CM | POA: Insufficient documentation

## 2012-06-04 DIAGNOSIS — S52201A Unspecified fracture of shaft of right ulna, initial encounter for closed fracture: Secondary | ICD-10-CM

## 2012-06-04 DIAGNOSIS — S52609A Unspecified fracture of lower end of unspecified ulna, initial encounter for closed fracture: Secondary | ICD-10-CM | POA: Insufficient documentation

## 2012-06-04 DIAGNOSIS — Y929 Unspecified place or not applicable: Secondary | ICD-10-CM | POA: Insufficient documentation

## 2012-06-04 DIAGNOSIS — IMO0002 Reserved for concepts with insufficient information to code with codable children: Secondary | ICD-10-CM | POA: Insufficient documentation

## 2012-06-04 DIAGNOSIS — Z862 Personal history of diseases of the blood and blood-forming organs and certain disorders involving the immune mechanism: Secondary | ICD-10-CM | POA: Insufficient documentation

## 2012-06-04 DIAGNOSIS — Z8619 Personal history of other infectious and parasitic diseases: Secondary | ICD-10-CM | POA: Insufficient documentation

## 2012-06-04 DIAGNOSIS — Y9383 Activity, rough housing and horseplay: Secondary | ICD-10-CM | POA: Insufficient documentation

## 2012-06-04 MED ORDER — OXYCODONE-ACETAMINOPHEN 5-325 MG PO TABS: 1.0000 | ORAL_TABLET | ORAL | Status: DC | PRN

## 2012-06-04 MED ORDER — OXYCODONE-ACETAMINOPHEN 5-325 MG PO TABS
1.0000 | ORAL_TABLET | Freq: Once | ORAL | Status: AC
  Administered 2012-06-04: 1 via ORAL
  Filled 2012-06-04: qty 1

## 2012-06-04 NOTE — ED Notes (Signed)
Pt was playing with friend and hit her friend's elbow with her forearm.  She states she heard a loud pop and has been experiencing pain every since.  Pt is able to move fingers. + radial pulse.

## 2012-06-04 NOTE — Progress Notes (Signed)
Orthopedic Tech Progress Note Patient Details:  Ellen Camacho 02-28-1986 [id number]  Ortho Devices Type of Ortho Device: Arm sling;Ace wrap;Sugartong splint Ortho Device/Splint Location: (R) UE Ortho Device/Splint Interventions: Application;Ordered   [name] 06/04/2012, 4:27 PM

## 2012-06-04 NOTE — ED Notes (Signed)
Ortho paged regarding splint.

## 2012-06-04 NOTE — ED Provider Notes (Signed)
History    This chart was scribed for non-physician practitioner, Trixie Dredge working with Nelia Shi, MD by Smitty Pluck, ED scribe. This patient was seen in room TR06C/TR06C and the patient's care was started at 4:03 PM.    CSN: [id number]  Arrival date & time 06/04/12  1357      Chief Complaint  Patient presents with  . Arm Pain     The history is provided by the patient. No language interpreter was used.   Ellen Camacho is a 27 y.o. female who presents to the Emergency Department complaining of constant, moderate right forearm pain radiating to right elbow and right fingers onset today. Pt reports that she was playing with a friend and back handed him causing her to hit his elbow. She reports that she heard a pop when she hit his elbow. She states she has decreased sensation and tingling sensation forearm and 4th and 5th fingers of right hand. Pt is able to move all digits of right hand. Pt denies fever, chills, nausea, vomiting, diarrhea, weakness, cough, SOB and any other pain.   Past Medical History  Diagnosis Date  . Anemia   . STD (female)     Past Surgical History  Procedure Laterality Date  . No past surgeries      Family History  Problem Relation Age of Onset  . Cancer Mother   . Diabetes Mother   . Hypertension Mother   . Heart disease Mother   . Stroke Brother   . Cancer Maternal Grandfather     History  Substance Use Topics  . Smoking status: Current Every Day Smoker    Types: Cigarettes  . Smokeless tobacco: Not on file  . Alcohol Use: No    OB History   Grav Para Term Preterm Abortions TAB SAB Ect Mult Living   4 3 3  0 0 0 0 0 0 3      Review of Systems  Constitutional: Negative for fever and chills.  Respiratory: Negative for cough and shortness of breath.   Gastrointestinal: Negative for nausea, vomiting and diarrhea.  Neurological: Positive for numbness. Negative for weakness.    Allergies  Review of patient's allergies  indicates no known allergies.  Home Medications  No current outpatient prescriptions on file.  BP 117/73  Pulse 84  Temp(Src) 98 F (36.7 C) (Oral)  Resp 18  SpO2 99%  LMP 05/12/2012  Breastfeeding? No  Physical Exam  Nursing note and vitals reviewed. Constitutional: She appears well-developed and well-nourished. No distress.  HENT:  Head: Normocephalic and atraumatic.  Neck: Neck supple.  Pulmonary/Chest: Effort normal.  Musculoskeletal:       Arms: Intact radial pulse  Cap refill is less than 2 seconds in all digits of right hand Sensation is intact in right arm and hand  Right shoulder and elbow are nontender No swelling   Neurological: She is alert.  Skin: She is not diaphoretic.    ED Course  Procedures (including critical care time) DIAGNOSTIC STUDIES: Oxygen Saturation is 99% on room air, normal by my interpretation.    COORDINATION OF CARE: 4:07 PM Discussed ED treatment with pt and pt agrees.     Labs Reviewed - No data to display Dg Forearm Right  06/04/2012  *RADIOLOGY REPORT*  Clinical Data: Blunt trauma, pain  RIGHT FOREARM - 2 VIEW  Comparison: None.  Findings: There is an acute minimally-displaced fracture of the right distal ulna shaft.  Mild soft tissue swelling in this  region. Radius appears intact.  IMPRESSION: Acute minimally-displaced right distal ulna fracture   Original Report Authenticated By: Judie Petit. Shick, M.D.      1. Ulnar fracture, right, closed, initial encounter     MDM  Pt with acute minimally displaced fracture of right distal ulnar.  Neurovascularly intact.  Placed in splint and sling in ED, d/c home with pain medications and hand follow up.  Discussed all results with patient.  Pt given return precautions.  Pt verbalizes understanding and agrees with plan.        I personally performed the services described in this documentation, which was scribed in my presence. The recorded information has been reviewed and is accurate.     Trixie Dredge, PA-C 06/04/12 1645

## 2012-06-05 NOTE — ED Provider Notes (Signed)
Medical screening examination/treatment/procedure(s) were performed by non-physician practitioner and as supervising physician I was immediately available for consultation/collaboration.   Latrelle Bazar L Josslyn Ciolek, MD 06/05/12 2244 

## 2012-06-26 ENCOUNTER — Emergency Department (INDEPENDENT_AMBULATORY_CARE_PROVIDER_SITE_OTHER): Payer: Medicaid Other

## 2012-06-26 ENCOUNTER — Emergency Department (INDEPENDENT_AMBULATORY_CARE_PROVIDER_SITE_OTHER)
Admission: EM | Admit: 2012-06-26 | Discharge: 2012-06-26 | Disposition: A | Discharge status: Home / Self Care | Payer: Medicaid Other | Source: Home / Self Care | Attending: Emergency Medicine | Admitting: Emergency Medicine

## 2012-06-26 ENCOUNTER — Encounter (HOSPITAL_COMMUNITY): Payer: Self-pay | Admitting: Emergency Medicine

## 2012-06-26 DIAGNOSIS — S52201D Unspecified fracture of shaft of right ulna, subsequent encounter for closed fracture with routine healing: Secondary | ICD-10-CM

## 2012-06-26 DIAGNOSIS — S5290XD Unspecified fracture of unspecified forearm, subsequent encounter for closed fracture with routine healing: Secondary | ICD-10-CM | POA: Diagnosis not present

## 2012-06-26 MED ORDER — HYDROCODONE-ACETAMINOPHEN 5-325 MG PO TABS
ORAL_TABLET | ORAL | Status: AC
  Filled 2012-06-26: qty 1

## 2012-06-26 MED ORDER — TRAMADOL HCL 50 MG PO TABS: 100.0000 mg | ORAL_TABLET | Freq: Three times a day (TID) | ORAL | Status: DC | PRN

## 2012-06-26 MED ORDER — HYDROCODONE-ACETAMINOPHEN 5-325 MG PO TABS
1.0000 | ORAL_TABLET | Freq: Once | ORAL | Status: AC
  Administered 2012-06-26: 1 via ORAL

## 2012-06-26 MED ORDER — IBUPROFEN 800 MG PO TABS
800.0000 mg | ORAL_TABLET | Freq: Once | ORAL | Status: AC
Start: 2012-06-26 — End: 2012-06-26
  Administered 2012-06-26: 800 mg via ORAL

## 2012-06-26 MED ORDER — IBUPROFEN 800 MG PO TABS
ORAL_TABLET | ORAL | Status: AC
  Filled 2012-06-26: qty 1

## 2012-06-26 MED ORDER — MELOXICAM 15 MG PO TABS: 15.0000 mg | ORAL_TABLET | Freq: Every day | ORAL | Status: DC

## 2012-06-26 NOTE — ED Notes (Signed)
Updated on delay

## 2012-06-26 NOTE — ED Notes (Signed)
Patient moving arm, bending elbow, using arm with gestures in explaining her story/history.

## 2012-06-26 NOTE — ED Provider Notes (Signed)
Chief Complaint:   Chief Complaint  Patient presents with  . Arm Pain    History of Present Illness:   Ellen Camacho is 27 year old female who was seen on March 25 at the emergency room because of an injury to her forearm. The patient states she was playing with a female friend and hit him with a back hand hit on his elbow, injuring her arm. X-rays revealed a minimally displaced fracture of the right distal ulna. This was placed in a sugar tong splint, she was given Percocet for the pain, and referred to a hand specialist. She removed the splint a week and a half ago, never did get the Percocet filled, and never did get in to see the hand specialist. She was having some problems with her Medicaid, and this prevented her from seeing a specialist or getting the Percocet filled. She has been without the splint for the last week and a half. She still has pain over the distal ulna which radiates to the wrist and the elbow. The pinky finger of the hand feels numb and tingly and the wrist and elbow feel stiff.  Review of Systems:  Other than noted above, the patient denies any of the following symptoms: Systemic:  No fevers, chills, sweats, or aches.  No fatigue or tiredness. Musculoskeletal:  No joint pain, arthritis, bursitis, swelling, back pain, or neck pain. Neurological:  No muscular weakness, paresthesias, headache, or trouble with speech or coordination.  No dizziness.  PMFSH:  Past medical history, family history, social history, meds, and allergies were reviewed.   Physical Exam:   Vital signs:  BP 124/75  Pulse 77  Temp(Src) 98.6 F (37 C) (Oral)  Resp 16  SpO2 99%  LMP 06/12/2012 Gen:  Alert and oriented times 3.  In no distress. Musculoskeletal: There is slight swelling and pain to palpation over the distal ulna of the right arm. The elbow and wrist both have a full range of motion but with slight pain. Sensation was diminished to light touch over the tip of the pinky finger.   Otherwise, all joints had a full a ROM with no swelling, bruising or deformity.  No edema, pulses full. Extremities were warm and pink.  Capillary refill was brisk.  Skin:  Clear, warm and dry.  No rash. Neuro:  Alert and oriented times 3.  Muscle strength was normal.  Sensation was intact to light touch.   Radiology:  Dg Forearm Right  06/26/2012  *RADIOLOGY REPORT*  Clinical Data: Injury, pain.  RIGHT FOREARM - 2 VIEW  Comparison: Plain films 06/04/2012.  Findings: Minimally-displaced distal ulnar fractures again seen without change in position or alignment.  There is callus formation about the fracture consistent with healing.  No other bony or joint abnormality is identified.  IMPRESSION: Healing distal ulnar fracture in near anatomic position and alignment.   Original Report Authenticated By: Holley Dexter, M.D.      Dg Forearm Right  06/04/2012  *RADIOLOGY REPORT*  Clinical Data: Blunt trauma, pain  RIGHT FOREARM - 2 VIEW  Comparison: None.  Findings: There is an acute minimally-displaced fracture of the right distal ulna shaft.  Mild soft tissue swelling in this region. Radius appears intact.  IMPRESSION: Acute minimally-displaced right distal ulna fracture   Original Report Authenticated By: Judie Petit. Miles Costain, M.D.      I reviewed the images independently and personally and concur with the radiologist's findings.  Course in Urgent Care Center:   For the pain she was given  Norco 5/325 and ibuprofen 800 mg with good relief of the pain. She was placed in a Velcro wrist splint.  Assessment:  The encounter diagnosis was Ulnar fracture, right, closed, with routine healing, subsequent encounter.  Her fracture is healing up, but will take a little bit more time. I referred her to the hand specialist on-call today, Dr. Mina Marble, but it's doubtful that she will go. I did give her some medicine on the 4 dollar list at [employer] which she should be able to afford. She will need immobilization of the wrist  for 4-5 more weeks. This should heal up well and completely.  Plan:   1.  The following meds were prescribed:   New Prescriptions   MELOXICAM (MOBIC) 15 MG TABLET    Take 1 tablet (15 mg total) by mouth daily.   TRAMADOL (ULTRAM) 50 MG TABLET    Take 2 tablets (100 mg total) by mouth every 8 (eight) hours as needed for pain.   2.  The patient was instructed in symptomatic care, including rest and activity, elevation, application of ice and compression.  Appropriate handouts were given. 3.  The patient was told to return if becoming worse in any way, if no better in 3 or 4 days, and given some red flag symptoms such as increasing pain or neurological symptoms that would indicate earlier return.   4.  The patient was told to follow up with Dr. Dairl Ponder within the next week or 2.    Reuben Likes, MD 06/26/12 1346

## 2012-06-26 NOTE — ED Notes (Signed)
Returned from [employer], given ginger ale/ice chips, repositioned exam table

## 2012-06-26 NOTE — ED Notes (Signed)
Onset of symptoms was 3/25.  Right arm pain, seen in the ed on 3/25 and diagnosed with fractured arm.  Unable to see specialist, too expensive.  Reports pain has worsened.  Reports removing her splint due to pain.  Reports feeling better without a splint.  Waiting for medicaid to be in place.

## 2012-09-24 ENCOUNTER — Emergency Department (HOSPITAL_COMMUNITY): Payer: Medicaid Other

## 2012-09-24 ENCOUNTER — Encounter (HOSPITAL_COMMUNITY): Payer: Self-pay | Admitting: *Deleted

## 2012-09-24 ENCOUNTER — Emergency Department (HOSPITAL_COMMUNITY)
Admission: EM | Admit: 2012-09-24 | Discharge: 2012-09-24 | Disposition: A | Discharge status: Home / Self Care | Payer: Self-pay | Source: Home / Self Care

## 2012-09-24 ENCOUNTER — Emergency Department (HOSPITAL_COMMUNITY)
Admission: EM | Admit: 2012-09-24 | Discharge: 2012-09-24 | Disposition: A | Discharge status: Home Health | Payer: Medicaid Other | Attending: Emergency Medicine | Admitting: Emergency Medicine

## 2012-09-24 DIAGNOSIS — G8911 Acute pain due to trauma: Secondary | ICD-10-CM | POA: Insufficient documentation

## 2012-09-24 DIAGNOSIS — F17200 Nicotine dependence, unspecified, uncomplicated: Secondary | ICD-10-CM | POA: Insufficient documentation

## 2012-09-24 DIAGNOSIS — Z8619 Personal history of other infectious and parasitic diseases: Secondary | ICD-10-CM | POA: Insufficient documentation

## 2012-09-24 DIAGNOSIS — Z862 Personal history of diseases of the blood and blood-forming organs and certain disorders involving the immune mechanism: Secondary | ICD-10-CM | POA: Insufficient documentation

## 2012-09-24 DIAGNOSIS — M25539 Pain in unspecified wrist: Secondary | ICD-10-CM | POA: Insufficient documentation

## 2012-09-24 DIAGNOSIS — M79601 Pain in right arm: Secondary | ICD-10-CM
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
## 2012-09-24 NOTE — ED Notes (Signed)
Patient transported to X-ray 

## 2012-09-25 NOTE — ED Provider Notes (Signed)
Medical screening examination/treatment/procedure(s) were performed by non-physician practitioner and as supervising physician I was immediately available for consultation/collaboration.   Mayan Kloepfer M Joshua Zeringue, DO 09/25/12 1548 

## 2013-05-28 ENCOUNTER — Encounter (HOSPITAL_COMMUNITY): Payer: Self-pay | Admitting: Emergency Medicine

## 2013-05-28 ENCOUNTER — Emergency Department (HOSPITAL_COMMUNITY)
Admission: EM | Admit: 2013-05-28 | Discharge: 2013-05-28 | Disposition: A | Discharge status: Home / Self Care | Payer: Medicaid Other | Attending: Emergency Medicine | Admitting: Emergency Medicine

## 2013-05-28 DIAGNOSIS — R112 Nausea with vomiting, unspecified: Secondary | ICD-10-CM | POA: Insufficient documentation

## 2013-05-28 DIAGNOSIS — Z862 Personal history of diseases of the blood and blood-forming organs and certain disorders involving the immune mechanism: Secondary | ICD-10-CM | POA: Insufficient documentation

## 2013-05-28 DIAGNOSIS — Z8619 Personal history of other infectious and parasitic diseases: Secondary | ICD-10-CM | POA: Insufficient documentation

## 2013-05-28 DIAGNOSIS — F17200 Nicotine dependence, unspecified, uncomplicated: Secondary | ICD-10-CM | POA: Insufficient documentation

## 2013-05-28 DIAGNOSIS — R1013 Epigastric pain: Secondary | ICD-10-CM | POA: Insufficient documentation

## 2013-05-28 DIAGNOSIS — Z3202 Encounter for pregnancy test, result negative: Secondary | ICD-10-CM | POA: Insufficient documentation

## 2013-05-28 DIAGNOSIS — R109 Unspecified abdominal pain: Secondary | ICD-10-CM

## 2013-05-28 LAB — CBC WITH DIFFERENTIAL/PLATELET
BASOS ABS: 0 10*3/uL (ref 0.0–0.1)
BASOS PCT: 0 % (ref 0–1)
Eosinophils Absolute: 0.1 10*3/uL (ref 0.0–0.7)
Eosinophils Relative: 1 % (ref 0–5)
HEMATOCRIT: 37.3 % (ref 36.0–46.0)
HEMOGLOBIN: 12.9 g/dL (ref 12.0–15.0)
LYMPHS PCT: 42 % (ref 12–46)
Lymphs Abs: 2.9 10*3/uL (ref 0.7–4.0)
MCH: 29.3 pg (ref 26.0–34.0)
MCHC: 34.6 g/dL (ref 30.0–36.0)
MCV: 84.6 fL (ref 78.0–100.0)
MONOS PCT: 9 % (ref 3–12)
Monocytes Absolute: 0.6 10*3/uL (ref 0.1–1.0)
NEUTROS ABS: 3.3 10*3/uL (ref 1.7–7.7)
NEUTROS PCT: 48 % (ref 43–77)
Platelets: 220 10*3/uL (ref 150–400)
RBC: 4.41 MIL/uL (ref 3.87–5.11)
RDW: 13.7 % (ref 11.5–15.5)
WBC: 6.9 10*3/uL (ref 4.0–10.5)

## 2013-05-28 LAB — COMPREHENSIVE METABOLIC PANEL
ALBUMIN: 3.8 g/dL (ref 3.5–5.2)
ALK PHOS: 79 U/L (ref 39–117)
ALT: 21 U/L (ref 0–35)
AST: 20 U/L (ref 0–37)
BUN: 12 mg/dL (ref 6–23)
CHLORIDE: 102 meq/L (ref 96–112)
CO2: 25 meq/L (ref 19–32)
Calcium: 9 mg/dL (ref 8.4–10.5)
Creatinine, Ser: 0.84 mg/dL (ref 0.50–1.10)
GFR calc Af Amer: >90 mL/min (ref >90)
Glucose, Bld: 101 mg/dL — ABNORMAL HIGH (ref 70–99)
POTASSIUM: 4 meq/L (ref 3.7–5.3)
Sodium: 141 mEq/L (ref 137–147)
Total Protein: 7.3 g/dL (ref 6.0–8.3)

## 2013-05-28 LAB — URINALYSIS, ROUTINE W REFLEX MICROSCOPIC
BILIRUBIN URINE: NEGATIVE
Glucose, UA: NEGATIVE mg/dL
KETONES UR: NEGATIVE mg/dL
Leukocytes, UA: NEGATIVE
Nitrite: NEGATIVE
PH: 6.5 (ref 5.0–8.0)
Protein, ur: NEGATIVE mg/dL
SPECIFIC GRAVITY, URINE: 1.018 (ref 1.005–1.030)
Urobilinogen, UA: 0.2 mg/dL (ref 0.0–1.0)

## 2013-05-28 LAB — URINE MICROSCOPIC-ADD ON

## 2013-05-28 LAB — PREGNANCY, URINE: Preg Test, Ur: NEGATIVE

## 2013-05-28 LAB — LIPASE, BLOOD: Lipase: 17 U/L (ref 11–59)

## 2013-05-28 MED ORDER — FAMOTIDINE 20 MG PO TABS
20.0000 mg | ORAL_TABLET | Freq: Once | ORAL | Status: AC
  Administered 2013-05-28: 20 mg via ORAL
  Filled 2013-05-28: qty 1

## 2013-05-28 MED ORDER — ONDANSETRON 4 MG PO TBDP
8.0000 mg | ORAL_TABLET | Freq: Once | ORAL | Status: AC
  Administered 2013-05-28: 8 mg via ORAL
  Filled 2013-05-28: qty 2

## 2013-05-28 MED ORDER — ONDANSETRON 8 MG PO TBDP: ORAL_TABLET | ORAL | Status: DC

## 2013-05-28 NOTE — ED Notes (Signed)
Pt unable to void at this time due to using the bathroom after she checked in

## 2013-05-28 NOTE — ED Notes (Signed)
Pt. woke up with mid/upper abdominal pain / cramping with nausea and vomitting this evening . Denies fever /chills or diarrhea.

## 2013-05-28 NOTE — Discharge Instructions (Signed)

## 2013-05-28 NOTE — ED Notes (Signed)
Patient is currently on her period.  Patient had visible vaginal bleeding on pelvic assessment with Dr. Fonnie JarvisBednar.

## 2013-05-28 NOTE — ED Provider Notes (Signed)
CSN: [id number]     Arrival date & time 05/28/13  0151 History   First MD Initiated Contact with Patient 05/28/13 [phone]     Chief Complaint  Patient presents with  . Abdominal Pain     (Consider location/radiation/quality/duration/timing/severity/associated sxs/prior Treatment) HPI 28 year old female woke up with 2 hours of epigastric pain with nausea vomited once nonbloody emesis pain is now much improved but not quite resolved;  She has no pain to her right or left upper quadrants no pain to her lower abdomen she does have vaginal bleeding and she is on her period; no vaginal discharge no dysuria no back pain no chest pain cough or shortness of breath no trauma no fever no rash no treatment prior to arrival. Her pain is minimal now. She also mentions that she has had a small chronically draining cyst in the right perianal region for the last few years which is not the reason for today's visit. Past Medical History  Diagnosis Date  . Anemia   . STD (female)    Past Surgical History  Procedure Laterality Date  . No past surgeries     Family History  Problem Relation Age of Onset  . Cancer Mother   . Diabetes Mother   . Hypertension Mother   . Heart disease Mother   . Stroke Brother   . Cancer Maternal Grandfather    History  Substance Use Topics  . Smoking status: Current Every Day Smoker    Types: Cigarettes  . Smokeless tobacco: Not on file  . Alcohol Use: No   OB History   Grav Para Term Preterm Abortions TAB SAB Ect Mult Living   4 3 3  0 0 0 0 0 0 3     Review of Systems 10 Systems reviewed and are negative for acute change except as noted in the HPI.   Allergies  Review of patient's allergies indicates no known allergies.  Home Medications   Current Outpatient Rx  Name  Route  Sig  Dispense  Refill  . ibuprofen (ADVIL,MOTRIN) 200 MG tablet   Oral   Take 200 mg by mouth every 6 (six) hours as needed for mild pain.         Marland Kitchen. ondansetron (ZOFRAN ODT) 8 MG  disintegrating tablet      8mg  ODT q4 hours prn nausea   2 tablet   0    BP 99/77  Pulse 58  Temp(Src) 98.3 F (36.8 C) (Oral)  Resp 15  Ht 5\' 6"  (1.676 m)  Wt 120 lb 4 oz (54.545 kg)  BMI 19.42 kg/m2  SpO2 100%  LMP 05/26/2013 Physical Exam  Nursing note and vitals reviewed. Constitutional:  Awake, alert, nontoxic appearance.  HENT:  Head: Atraumatic.  Eyes: Right eye exhibits no discharge. Left eye exhibits no discharge.  Neck: Neck supple.  Cardiovascular: Normal rate and regular rhythm.   No murmur heard. Pulmonary/Chest: Effort normal and breath sounds normal. No respiratory distress. She has no wheezes. She has no rales. She exhibits no tenderness.  Abdominal: Soft. Bowel sounds are normal. She exhibits no distension and no mass. There is tenderness. There is no rebound and no guarding.  Minimal epigastric tenderness only with the rest of the abdomen nontender  Genitourinary:  Chaperone present for bimanual examination with no cervical motion tenderness no pelvic tenderness no palpable masses examination glove has some dark blood but no discharge noted; examination of the perianal region reveals a right perianal approximately 2 cm diameter cyst with  scant clear drainage with no fluctuance no purulent drainage no erythema no induration no tenderness to suggest need for emergency incision and drainage  Musculoskeletal: She exhibits no tenderness.  Baseline ROM, no obvious new focal weakness.  Neurological:  Mental status and motor strength appears baseline for patient and situation.  Skin: No rash noted.  Psychiatric: She has a normal mood and affect.    ED Course  Procedures (including critical care time) Patient informed of clinical course, understand medical decision-making process, and agree with plan.Pt stable in ED with no significant deterioration in condition. Labs Review Labs Reviewed  COMPREHENSIVE METABOLIC PANEL - Abnormal; Notable for the following:     Glucose, Bld 101 (*)    Total Bilirubin <0.2 (*)    All other components within normal limits  URINALYSIS, ROUTINE W REFLEX MICROSCOPIC - Abnormal; Notable for the following:    Hgb urine dipstick LARGE (*)    All other components within normal limits  CBC WITH DIFFERENTIAL  LIPASE, BLOOD  PREGNANCY, URINE  URINE MICROSCOPIC-ADD ON   Imaging Review No results found.   EKG Interpretation None      MDM   Final diagnoses:  Abdominal pain    I doubt any other EMC precluding discharge at this time including, but not necessarily limited to the following:SBI, peritonitis.    Hurman Horn, MD 05/30/13 1341

## 2014-01-12 ENCOUNTER — Encounter (HOSPITAL_COMMUNITY): Payer: Self-pay | Admitting: Emergency Medicine

## 2014-02-09 ENCOUNTER — Encounter (HOSPITAL_COMMUNITY): Payer: Self-pay | Admitting: *Deleted

## 2014-02-09 ENCOUNTER — Emergency Department (HOSPITAL_COMMUNITY)
Admission: EM | Admit: 2014-02-09 | Discharge: 2014-02-09 | Discharge status: Absent without leave | Payer: Medicaid Other

## 2014-02-09 ENCOUNTER — Emergency Department (HOSPITAL_COMMUNITY)
Admission: EM | Admit: 2014-02-09 | Discharge: 2014-02-10 | Disposition: A | Discharge status: Home / Self Care | Payer: Medicaid Other | Attending: Emergency Medicine | Admitting: Emergency Medicine

## 2014-02-09 DIAGNOSIS — N898 Other specified noninflammatory disorders of vagina: Secondary | ICD-10-CM | POA: Diagnosis present

## 2014-02-09 DIAGNOSIS — Z3202 Encounter for pregnancy test, result negative: Secondary | ICD-10-CM | POA: Insufficient documentation

## 2014-02-09 DIAGNOSIS — N390 Urinary tract infection, site not specified: Secondary | ICD-10-CM | POA: Diagnosis not present

## 2014-02-09 DIAGNOSIS — Z7251 High risk heterosexual behavior: Secondary | ICD-10-CM

## 2014-02-09 DIAGNOSIS — Z862 Personal history of diseases of the blood and blood-forming organs and certain disorders involving the immune mechanism: Secondary | ICD-10-CM | POA: Insufficient documentation

## 2014-02-09 DIAGNOSIS — Z79899 Other long term (current) drug therapy: Secondary | ICD-10-CM | POA: Diagnosis not present

## 2014-02-09 DIAGNOSIS — Z720 Tobacco use: Secondary | ICD-10-CM | POA: Diagnosis not present

## 2014-02-09 NOTE — ED Notes (Signed)
Patient not in room. Placed back in waiting room.

## 2014-02-09 NOTE — ED Provider Notes (Signed)
CSN: [id number]     Arrival date & time 02/09/14  1949 History    Chief Complaint  Patient presents with  . Vaginal Discharge    HPI Comments: 28 year old female with a history of STDs presents to the emergency department for STD testing. Patient states that the partner with whom she is sexually active has been experiencing penile discharge. She states that he was tested and treated for STDs today and she wants to have the same performed. She states that she has been experiencing some mild, constant vaginal discharge for an unknown period of time. She denies any modifying factors of the symptoms and denies associated fevers, abdominal pain, nausea, vomiting, dysuria or hematuria, or vaginal bleeding.  The history is provided by the patient. No language interpreter was used.    Past Medical History  Diagnosis Date  . Anemia   . STD (female)    Past Surgical History  Procedure Laterality Date  . No past surgeries     Family History  Problem Relation Age of Onset  . Cancer Mother   . Diabetes Mother   . Hypertension Mother   . Heart disease Mother   . Stroke Brother   . Cancer Maternal Grandfather    History  Substance Use Topics  . Smoking status: Current Every Day Smoker    Types: Cigarettes  . Smokeless tobacco: Not on file  . Alcohol Use: No   OB History    Gravida Para Term Preterm AB TAB SAB Ectopic Multiple Living   4 3 3  0 0 0 0 0 0 3      Review of Systems  Genitourinary: Positive for vaginal discharge.  All other systems reviewed and are negative.   Allergies  Review of patient's allergies indicates no known allergies.  Home Medications   Prior to Admission medications   Medication Sig Start Date End Date Taking? Authorizing Provider  ibuprofen (ADVIL,MOTRIN) 200 MG tablet Take 200 mg by mouth every 6 (six) hours as needed for mild pain.   Yes Historical Provider, MD  lurasidone (LATUDA) 40 MG TABS tablet Take 40 mg by mouth daily with breakfast.   Yes  Historical Provider, MD  ondansetron (ZOFRAN ODT) 8 MG disintegrating tablet 8mg  ODT q4 hours prn nausea 05/28/13  Yes Hurman HornJohn M Bednar, MD  nitrofurantoin, macrocrystal-monohydrate, (MACROBID) 100 MG capsule Take 1 capsule (100 mg total) by mouth 2 (two) times daily. 02/10/14   Antony MaduraKelly Elice Crigger, PA-C   BP 122/73 mmHg  Pulse 76  Temp(Src) 98 F (36.7 C) (Oral)  Resp 20  SpO2 100%   Physical Exam  Constitutional: She is oriented to person, place, and time. She appears well-developed and well-nourished. No distress.  Nontoxic/nonseptic appearing. Patient preoccupied on cell phone  HENT:  Head: Normocephalic and atraumatic.  Eyes: Conjunctivae and EOM are normal. No scleral icterus.  Neck: Normal range of motion.  Pulmonary/Chest: Effort normal. No respiratory distress.  Respirations even and unlabored  Abdominal: Soft. She exhibits no distension. There is no tenderness.  Soft, nontender.  Genitourinary: There is no rash, tenderness, lesion or injury on the right labia. There is no rash, tenderness, lesion or injury on the left labia. Uterus is not tender. Cervix exhibits no motion tenderness and no friability. Right adnexum displays no mass, no tenderness and no fullness. Left adnexum displays no mass, no tenderness and no fullness. No tenderness or bleeding in the vagina. No foreign body around the vagina. Vaginal discharge found.  Mild opaque white/yellow discharge  Musculoskeletal:  Normal range of motion.  Neurological: She is alert and oriented to person, place, and time. She exhibits normal muscle tone. Coordination normal.  GCS 15. Speech is goal oriented.  Skin: Skin is warm and dry. No rash noted. She is not diaphoretic. No erythema. No pallor.  Psychiatric: She has a normal mood and affect. Her behavior is normal.  Nursing note and vitals reviewed.   ED Course  Procedures (including critical care time)  Labs Review Labs Reviewed  WET PREP, GENITAL - Abnormal; Notable for the  following:    Clue Cells Wet Prep HPF POC MANY (*)    WBC, Wet Prep HPF POC TOO NUMEROUS TO COUNT (*)    All other components within normal limits  URINALYSIS, ROUTINE W REFLEX MICROSCOPIC - Abnormal; Notable for the following:    APPearance CLOUDY (*)    Nitrite POSITIVE (*)    All other components within normal limits  URINE MICROSCOPIC-ADD ON - Abnormal; Notable for the following:    Squamous Epithelial / LPF FEW (*)    Bacteria, UA MANY (*)    All other components within normal limits  GC/CHLAMYDIA PROBE AMP  URINE CULTURE  PREGNANCY, URINE  HIV ANTIBODY (ROUTINE TESTING)  RPR    Imaging Review No results found.   EKG Interpretation None      MDM   Final diagnoses:  History of unprotected sex  UTI (lower urinary tract infection)    Patient to be discharged with instructions to follow up with [facility]. Pt understands that they have GC/Chlamydia cultures pending and that they will need to inform all sexual partners if results return positive. Pt has been treated prophylacticly with azithromycin and rocephin due to pts history, pelvic exam, and wet prep with increased WBCs. Pt not concerning for PID; patient hemodynamically stable with no cervical motion tenderness on pelvic exam. Pt will also be treated for UTI given many bacteria and nitrite positive on UA. Upreg negative. Return precautions provided and patient agreeable to plan with no unaddressed concerns. Patient discharged in good condition.   Filed Vitals:   02/09/14 2034  BP: 122/73  Pulse: 76  Temp: 98 F (36.7 C)  TempSrc: Oral  Resp: 20  SpO2: 100%       Antony MaduraKelly Electra Paladino, PA-C 02/10/14 0052  Dione Boozeavid Glick, MD 02/10/14 [id number]

## 2014-02-09 NOTE — ED Notes (Signed)
Pt returned to desk stating she was back and wanted to be seen.

## 2014-02-09 NOTE — ED Notes (Signed)
Pt in requesting to be tested for STDs, reports vaginal discharge for an unknown amount of time, also c/o cough and congestion but states she does not want testing related to that.

## 2014-02-10 LAB — RPR

## 2014-02-10 LAB — URINALYSIS, ROUTINE W REFLEX MICROSCOPIC
Bilirubin Urine: NEGATIVE
Glucose, UA: NEGATIVE mg/dL
Hgb urine dipstick: NEGATIVE
KETONES UR: NEGATIVE mg/dL
LEUKOCYTES UA: NEGATIVE
NITRITE: POSITIVE — AB
PH: 6.5 (ref 5.0–8.0)
Protein, ur: NEGATIVE mg/dL
SPECIFIC GRAVITY, URINE: 1.022 (ref 1.005–1.030)
Urobilinogen, UA: 0.2 mg/dL (ref 0.0–1.0)

## 2014-02-10 LAB — WET PREP, GENITAL
Trich, Wet Prep: NONE SEEN
Yeast Wet Prep HPF POC: NONE SEEN

## 2014-02-10 LAB — PREGNANCY, URINE: PREG TEST UR: NEGATIVE

## 2014-02-10 LAB — HIV ANTIBODY (ROUTINE TESTING W REFLEX): HIV 1&2 Ab, 4th Generation: NONREACTIVE

## 2014-02-10 LAB — URINE MICROSCOPIC-ADD ON

## 2014-02-10 MED ORDER — AZITHROMYCIN 250 MG PO TABS
1000.0000 mg | ORAL_TABLET | Freq: Once | ORAL | Status: AC
  Administered 2014-02-10: 1000 mg via ORAL
  Filled 2014-02-10: qty 4

## 2014-02-10 MED ORDER — NITROFURANTOIN MONOHYD MACRO 100 MG PO CAPS: 100.0000 mg | ORAL_CAPSULE | Freq: Two times a day (BID) | ORAL | Status: DC

## 2014-02-10 MED ORDER — STERILE WATER FOR INJECTION IJ SOLN
INTRAMUSCULAR | Status: AC
  Administered 2014-02-10: 10 mL
  Filled 2014-02-10: qty 10

## 2014-02-10 MED ORDER — CEFTRIAXONE SODIUM 250 MG IJ SOLR
250.0000 mg | Freq: Once | INTRAMUSCULAR | Status: AC
  Administered 2014-02-10: 250 mg via INTRAMUSCULAR
  Filled 2014-02-10: qty 250

## 2014-02-10 NOTE — Discharge Instructions (Signed)
Follow-up with the Health Department regarding the results of your gonorrhea/Chlamydia culture, syphilis testing, and HIV testing. These results should be completed within 48 hours. Your urinalysis today showed signs of a urinary tract infection. Recommend you take Macrobid as prescribed. Do not engage in sexual intercourse for one week after treatment (1 week from today). Be sure to contact all of your sexual partners to notify them that you have been tested and treated for STDs. Return to the emergency department as needed if symptoms worsen.  Sexually Transmitted Disease A sexually transmitted disease (STD) is a disease or infection that may be passed (transmitted) from person to person, usually during sexual activity. This may happen by way of saliva, semen, blood, vaginal mucus, or urine. Common STDs include:   Gonorrhea.   Chlamydia.   Syphilis.   HIV and AIDS.   Genital herpes.   Hepatitis B and C.   Trichomonas.   Human papillomavirus (HPV).   Pubic lice.   Scabies.  Mites.  Bacterial vaginosis. WHAT ARE CAUSES OF STDs? An STD may be caused by bacteria, a virus, or parasites. STDs are often transmitted during sexual activity if one person is infected. However, they may also be transmitted through nonsexual means. STDs may be transmitted after:   Sexual intercourse with an infected person.   Sharing sex toys with an infected person.   Sharing needles with an infected person or using unclean piercing or tattoo needles.  Having intimate contact with the genitals, mouth, or rectal areas of an infected person.   Exposure to infected fluids during birth. WHAT ARE THE SIGNS AND SYMPTOMS OF STDs? Different STDs have different symptoms. Some people may not have any symptoms. If symptoms are present, they may include:   Painful or bloody urination.   Pain in the pelvis, abdomen, vagina, anus, throat, or eyes.   A skin rash, itching, or  irritation.  Growths, ulcerations, blisters, or sores in the genital and anal areas.  Abnormal vaginal discharge with or without bad odor.   Penile discharge in men.   Fever.   Pain or bleeding during sexual intercourse.   Swollen glands in the groin area.   Yellow skin and eyes (jaundice). This is seen with hepatitis.   Swollen testicles.  Infertility.  Sores and blisters in the mouth. HOW ARE STDs DIAGNOSED? To make a diagnosis, your health care provider may:   Take a medical history.   Perform a physical exam.   Take a sample of any discharge to examine.  Swab the throat, cervix, opening to the penis, rectum, or vagina for testing.  Test a sample of your first morning urine.   Perform blood tests.   Perform a Pap test, if this applies.   Perform a colposcopy.   Perform a laparoscopy.  HOW ARE STDs TREATED? Treatment depends on the STD. Some STDs may be treated but not cured.   Chlamydia, gonorrhea, trichomonas, and syphilis can be cured with antibiotic medicine.   Genital herpes, hepatitis, and HIV can be treated, but not cured, with prescribed medicines. The medicines lessen symptoms.   Genital warts from HPV can be treated with medicine or by freezing, burning (electrocautery), or surgery. Warts may come back.   HPV cannot be cured with medicine or surgery. However, abnormal areas may be removed from the cervix, vagina, or vulva.   If your diagnosis is confirmed, your recent sexual partners need treatment. This is true even if they are symptom-free or have a negative culture or  evaluation. They should not have sex until their health care providers say it is okay. HOW CAN I REDUCE MY RISK OF GETTING AN STD? Take these steps to reduce your risk of getting an STD:  Use latex condoms, dental dams, and water-soluble lubricants during sexual activity. Do not use petroleum jelly or oils.  Avoid having multiple sex partners.  Do not have sex  with someone who has other sex partners.  Do not have sex with anyone you do not know or who is at high risk for an STD.  Avoid risky sex practices that can break your skin.  Do not have sex if you have open sores on your mouth or skin.  Avoid drinking too much alcohol or taking illegal drugs. Alcohol and drugs can affect your judgment and put you in a vulnerable position.  Avoid engaging in oral and anal sex acts.  Get vaccinated for HPV and hepatitis. If you have not received these vaccines in the past, talk to your health care provider about whether one or both might be right for you.   If you are at risk of being infected with HIV, it is recommended that you take a prescription medicine daily to prevent HIV infection. This is called pre-exposure prophylaxis (PrEP). You are considered at risk if:  You are a man who has sex with other men (MSM).  You are a heterosexual man or woman and are sexually active with more than one partner.  You take drugs by injection.  You are sexually active with a partner who has HIV.  Talk with your health care provider about whether you are at high risk of being infected with HIV. If you choose to begin PrEP, you should first be tested for HIV. You should then be tested every 3 months for as long as you are taking PrEP.  WHAT SHOULD I DO IF I THINK I HAVE AN STD?  See your health care provider.   Tell your sexual partner(s). They should be tested and treated for any STDs.  Do not have sex until your health care provider says it is okay. WHEN SHOULD I GET IMMEDIATE MEDICAL CARE? Contact your health care provider right away if:   You have severe abdominal pain.  You are a man and notice swelling or pain in your testicles.  You are a woman and notice swelling or pain in your vagina. Document Released: 05/20/2002 Document Revised: 03/04/2013 Document Reviewed: 09/17/2012 [employer] [facility] Patient Information 2015 [place], [place]. This information is  not intended to replace advice given to you by your health care provider. Make sure you discuss any questions you have with your health care provider.  Urinary Tract Infection Urinary tract infections (UTIs) can develop anywhere along your urinary tract. Your urinary tract is your body's drainage system for removing wastes and extra water. Your urinary tract includes two kidneys, two ureters, a bladder, and a urethra. Your kidneys are a pair of bean-shaped organs. Each kidney is about the size of your fist. They are located below your ribs, one on each side of your spine. CAUSES Infections are caused by microbes, which are microscopic organisms, including fungi, viruses, and bacteria. These organisms are so small that they can only be seen through a microscope. Bacteria are the microbes that most commonly cause UTIs. SYMPTOMS  Symptoms of UTIs may vary by age and gender of the patient and by the location of the infection. Symptoms in young women typically include a frequent and intense urge to  urinate and a painful, burning feeling in the bladder or urethra during urination. Older women and men are more likely to be tired, shaky, and weak and have muscle aches and abdominal pain. A fever may mean the infection is in your kidneys. Other symptoms of a kidney infection include pain in your back or sides below the ribs, nausea, and vomiting. DIAGNOSIS To diagnose a UTI, your caregiver will ask you about your symptoms. Your caregiver also will ask to provide a urine sample. The urine sample will be tested for bacteria and white blood cells. White blood cells are made by your body to help fight infection. TREATMENT  Typically, UTIs can be treated with medication. Because most UTIs are caused by a bacterial infection, they usually can be treated with the use of antibiotics. The choice of antibiotic and length of treatment depend on your symptoms and the type of bacteria causing your infection. HOME CARE  INSTRUCTIONS  If you were prescribed antibiotics, take them exactly as your caregiver instructs you. Finish the medication even if you feel better after you have only taken some of the medication.  Drink enough water and fluids to keep your urine clear or pale yellow.  Avoid caffeine, tea, and carbonated beverages. They tend to irritate your bladder.  Empty your bladder often. Avoid holding urine for long periods of time.  Empty your bladder before and after sexual intercourse.  After a bowel movement, women should cleanse from front to back. Use each tissue only once. SEEK MEDICAL CARE IF:   You have back pain.  You develop a fever.  Your symptoms do not begin to resolve within 3 days. SEEK IMMEDIATE MEDICAL CARE IF:   You have severe back pain or lower abdominal pain.  You develop chills.  You have nausea or vomiting.  You have continued burning or discomfort with urination. MAKE SURE YOU:   Understand these instructions.  Will watch your condition.  Will get help right away if you are not doing well or get worse. Document Released: 12/07/2004 Document Revised: 08/29/2011 Document Reviewed: 04/07/2011 [facility]ExitCare Patient Information 2015 [place], [place]. This information is not intended to replace advice given to you by your health care provider. Make sure you discuss any questions you have with your health care provider.

## 2014-02-11 LAB — GC/CHLAMYDIA PROBE AMP
CT Probe RNA: POSITIVE — AB
GC Probe RNA: POSITIVE — AB

## 2014-02-12 ENCOUNTER — Telehealth (HOSPITAL_BASED_OUTPATIENT_CLINIC_OR_DEPARTMENT_OTHER): Payer: Self-pay | Admitting: *Deleted

## 2014-02-12 LAB — URINE CULTURE: Colony Count: >=100000

## 2014-02-14 ENCOUNTER — Telehealth (HOSPITAL_BASED_OUTPATIENT_CLINIC_OR_DEPARTMENT_OTHER): Payer: Self-pay | Admitting: *Deleted

## 2014-02-15 ENCOUNTER — Telehealth (HOSPITAL_COMMUNITY): Payer: Self-pay

## 2014-02-15 ENCOUNTER — Telehealth: Payer: Self-pay | Admitting: Emergency Medicine

## 2014-02-15 NOTE — Telephone Encounter (Signed)
Post ED Visit - Positive Culture Follow-up  Culture report reviewed by antimicrobial stewardship pharmacist: []  Wes Dulaney, Pharm.D., BCPS [x]  Celedonio MiyamotoJeremy Frens, Pharm.D., BCPS []  Georgina PillionElizabeth Weiler, Pharm.D., BCPS []  MescaleroMinh Pham, [address]Pharm[address]D., BCPS, AAHIVP []  Estella HuskMichelle Turner, Pharm.D., BCPS, AAHIVP []  Babs BertinHaley Baird, [address]Pharm[address]D.   Positive Urine culture, >/= 100,000 colonies -> E Coli Treated with Nitofurantoin, organism sensitive to the same and no further patient follow-up is required at this time.  Arvid RightClark, Leda Bellefeuille Dorn 02/15/2014, 8:18 PM

## 2014-02-15 NOTE — Telephone Encounter (Signed)
Positive Chlamydia  Positive Gonorrhea Treated with Rocephin and Zithromax per protocol MD [facility] faxed  02/14/14 pt contacted: pt returned call. ID verified. pt notified of + gonorrhea and chlamydia. per pt's chart treated with Rocephin and Zithromax while in ED - pt confirmed. STD instructions provided, patient verbalized understanding.

## 2014-03-13 NOTE — L&D Delivery Note (Signed)
  Raynaldo OpitzMartin, GirlA Elanie [[id number]][[id number]]  Delivery Note Began to have pain breaking through epidural so I checked her and noted her to be completely dilated.  Dr Debroah LoopArnold notified and arrived. She pushed well to SVD.  At 3:24 PM a viable and healthy female was delivered via Vaginal, Spontaneous Delivery (Presentation: Left Occiput Posterior).  APGAR: 10, 10; weight 4 lb 6.2 oz (1990 g).   Placenta status: Intact, Spontaneous.  Cord:  with the following complications: nuchal cord x 1  Anesthesia: Epidural  Episiotomy: None Lacerations: 1st degree;Labial Suture Repair: 3.0 vicryl Est. Blood Loss (mL): 350  Mom to postpartum.  Baby to Couplet care / Skin to Skin.  [facility] CenterWILLIAMS,Shiryl Ruddy 02/12/2015, 4:14 PM     Ansel BongMartin, BoyB Caelen [[id number]][[id number]]  Delivery Note Cervix regressed to 9cm so we waited for more contractions. Then AROM was performed by Dr Debroah LoopArnold and cervix progressed to complete dilation.  At 3:59 PM a viable and healthy female was delivered via Vaginal, Spontaneous Delivery (Presentation: Left Occiput Posterior).  APGAR: 10, 10; weight 5 lb 0.1 oz (2270 g).   Placenta status: Intact, Spontaneous.  Cord:  with the following complications: none  Anesthesia: Epidural  Episiotomy: None Lacerations: 1st degree;Labial Suture Repair: 3.0 vicryl Est. Blood Loss (mL): 350  Mom to postpartum.  Baby to Couplet care / Skin to Skin.  Wynelle BourgeoisWILLIAMS,Geoge Lawrance 02/12/2015, 4:14 PM

## 2014-07-09 ENCOUNTER — Encounter (HOSPITAL_COMMUNITY): Payer: Self-pay | Admitting: *Deleted

## 2014-07-09 ENCOUNTER — Inpatient Hospital Stay (HOSPITAL_COMMUNITY)
Admission: AD | Admit: 2014-07-09 | Discharge: 2014-07-09 | Disposition: A | Discharge status: Home / Self Care | Payer: Medicaid Other | Source: Ambulatory Visit | Attending: Obstetrics & Gynecology | Admitting: Obstetrics & Gynecology

## 2014-07-09 ENCOUNTER — Inpatient Hospital Stay (HOSPITAL_COMMUNITY): Payer: Medicaid Other

## 2014-07-09 DIAGNOSIS — O9989 Other specified diseases and conditions complicating pregnancy, childbirth and the puerperium: Secondary | ICD-10-CM | POA: Diagnosis not present

## 2014-07-09 DIAGNOSIS — Z3201 Encounter for pregnancy test, result positive: Secondary | ICD-10-CM | POA: Insufficient documentation

## 2014-07-09 DIAGNOSIS — O26899 Other specified pregnancy related conditions, unspecified trimester: Secondary | ICD-10-CM

## 2014-07-09 DIAGNOSIS — R51 Headache: Secondary | ICD-10-CM | POA: Insufficient documentation

## 2014-07-09 DIAGNOSIS — R109 Unspecified abdominal pain: Secondary | ICD-10-CM | POA: Diagnosis not present

## 2014-07-09 DIAGNOSIS — F17210 Nicotine dependence, cigarettes, uncomplicated: Secondary | ICD-10-CM | POA: Insufficient documentation

## 2014-07-09 HISTORY — DX: Headache: R51

## 2014-07-09 HISTORY — DX: Other seasonal allergic rhinitis: J30.2

## 2014-07-09 HISTORY — DX: Headache, unspecified: R51.9

## 2014-07-09 LAB — CBC
HCT: 33.8 % — ABNORMAL LOW (ref 36.0–46.0)
Hemoglobin: 11.6 g/dL — ABNORMAL LOW (ref 12.0–15.0)
MCH: 28.9 pg (ref 26.0–34.0)
MCHC: 34.3 g/dL (ref 30.0–36.0)
MCV: 84.1 fL (ref 78.0–100.0)
Platelets: 175 10*3/uL (ref 150–400)
RBC: 4.02 MIL/uL (ref 3.87–5.11)
RDW: 13.5 % (ref 11.5–15.5)
WBC: 7.4 10*3/uL (ref 4.0–10.5)

## 2014-07-09 LAB — URINALYSIS, ROUTINE W REFLEX MICROSCOPIC
Bilirubin Urine: NEGATIVE
GLUCOSE, UA: NEGATIVE mg/dL
HGB URINE DIPSTICK: NEGATIVE
Ketones, ur: NEGATIVE mg/dL
LEUKOCYTES UA: NEGATIVE
Nitrite: NEGATIVE
PH: 6 (ref 5.0–8.0)
Protein, ur: NEGATIVE mg/dL
Specific Gravity, Urine: 1.02 (ref 1.005–1.030)
Urobilinogen, UA: 0.2 mg/dL (ref 0.0–1.0)

## 2014-07-09 LAB — POCT PREGNANCY, URINE: Preg Test, Ur: POSITIVE — AB

## 2014-07-09 LAB — HCG, QUANTITATIVE, PREGNANCY: hCG, Beta Chain, Quant, S: 3205 m[IU]/mL — ABNORMAL HIGH (ref <5)

## 2014-07-09 NOTE — MAU Note (Signed)
I called patient on her cell phone. She says she is about to pull back into parking lot. She says she had to go get her car from a friend and is coming back in. Patient instructed to sit in lobby and we will talk with her when she comes back in.

## 2014-07-09 NOTE — MAU Note (Signed)
Pt back in lobby waiting for phlebotomy to call into lab for blood draw.

## 2014-07-09 NOTE — MAU Note (Signed)
Patient left from lobby at 1429. I just called patients cell phone and did not get an answer.

## 2014-07-09 NOTE — MAU Note (Signed)
Pt back in lobby waiting

## 2014-07-09 NOTE — MAU Note (Signed)
Pt reprots seh missed her period and c/o some cramping for several weeks and breast tenderness.

## 2014-07-09 NOTE — Discharge Instructions (Signed)
°Ectopic Pregnancy °An ectopic pregnancy is when the fertilized egg attaches (implants) outside the uterus. Most ectopic pregnancies occur in the fallopian tube. Rarely do ectopic pregnancies occur on the ovary, intestine, pelvis, or cervix. In an ectopic pregnancy, the fertilized egg does not have the ability to develop into a normal, healthy baby.  °A ruptured ectopic pregnancy is one in which the fallopian tube gets torn or bursts and results in internal bleeding. Often there is intense abdominal pain, and sometimes, vaginal bleeding. Having an ectopic pregnancy can be life threatening. If left untreated, this dangerous condition can lead to a blood transfusion, abdominal surgery, or even death. °CAUSES  °Damage to the fallopian tubes is the suspected cause in most ectopic pregnancies.  °RISK FACTORS °Depending on your circumstances, the risk of having an ectopic pregnancy will vary. The level of risk can be divided into three categories. °High Risk °· You have gone through infertility treatment. °· You have had a previous ectopic pregnancy. °· You have had previous tubal surgery. °· You have had previous surgery to have the fallopian tubes tied (tubal ligation). °· You have tubal problems or diseases. °· You have been exposed to DES. DES is a medicine that was used until 1971 and had effects on babies whose mothers took the medicine. °· You become pregnant while using an intrauterine device (IUD) for birth control.  °Moderate Risk °· You have a history of infertility. °· You have a history of a sexually transmitted infection (STI). °· You have a history of pelvic inflammatory disease (PID). °· You have scarring from endometriosis. °· You have multiple sexual partners. °· You smoke.  °Low Risk °· You have had previous pelvic surgery. °· You use vaginal douching. °· You became sexually active before 31 years of age. °SIGNS AND SYMPTOMS  °An ectopic pregnancy should be suspected in anyone who has missed a period  and has abdominal pain or bleeding. °· You may experience normal pregnancy symptoms, such as: °¨ Nausea. °¨ Tiredness. °¨ Breast tenderness. °· Other symptoms may include: °¨ Pain with intercourse. °¨ Irregular vaginal bleeding or spotting. °¨ Cramping or pain on one side or in the lower abdomen. °¨ Fast heartbeat. °¨ Passing out while having a bowel movement. °· Symptoms of a ruptured ectopic pregnancy and internal bleeding may include: °¨ Sudden, severe pain in the abdomen and pelvis. °¨ Dizziness or fainting. °¨ Pain in the shoulder area. °DIAGNOSIS  °Tests that may be performed include: °· A pregnancy test. °· An ultrasound test. °· Testing the specific level of pregnancy hormone in the bloodstream. °· Taking a sample of uterus tissue (dilation and curettage, D&C). °· Surgery to perform a visual exam of the inside of the abdomen using a thin, lighted tube with a tiny camera on the end (laparoscope). °TREATMENT  °An injection of a medicine called methotrexate may be given. This medicine causes the pregnancy tissue to be absorbed. It is given if: °· The diagnosis is made early. °· The fallopian tube has not ruptured. °· You are considered to be a good candidate for the medicine. °Usually, pregnancy hormone blood levels are checked after methotrexate treatment. This is to be sure the medicine is effective. It may take 4-6 weeks for the pregnancy to be absorbed (though most pregnancies will be absorbed by 3 weeks). °Surgical treatment may be needed. A laparoscope may be used to remove the pregnancy tissue. If severe internal bleeding occurs, a cut (incision) may be made in the lower abdomen (laparotomy), and the ectopic   pregnancy is removed. This stops the bleeding. Part of the fallopian tube, or the whole tube, may be removed as well (salpingectomy). After surgery, pregnancy hormone tests may be done to be sure there is no pregnancy tissue left. You may receive a Rho (D) immune globulin shot if you are Rh negative  and the father is Rh positive, or if you do not know the Rh type of the father. This is to prevent problems with any future pregnancy. °SEEK IMMEDIATE MEDICAL CARE IF:  °You have any symptoms of an ectopic pregnancy. This is a medical emergency. °MAKE SURE YOU: °· Understand these instructions. °· Will watch your condition. °· Will get help right away if you are not doing well or get worse. °Document Released: 04/06/2004 Document Revised: 07/14/2013 Document Reviewed: 09/26/2012 °ExitCare® Patient Information ©2015 [employer]. This information is not intended to replace advice given to you by your health care provider. Make sure you discuss any questions you have with your health care provider. ° ° °

## 2014-07-09 NOTE — MAU Note (Signed)
Per Demi NT, patient walked out of room and outside at 1214pm.

## 2014-07-09 NOTE — MAU Provider Note (Signed)
History     CSN: [id number]  Arrival date and time: 07/09/14 1108   None     Chief Complaint  Patient presents with  . Possible Pregnancy   HPI  29 y.o. [id number] presents with the complaint of she missed her period and c/o cramping for several weeks and breast tenderness. Has a headache. Denies vaginal bleeding/discharge.   Past Medical History  Diagnosis Date  . Anemia   . STD (female)   . Headache   . Seasonal allergies     Past Surgical History  Procedure Laterality Date  . No past surgeries      Family History  Problem Relation Age of Onset  . Cancer Mother   . Diabetes Mother   . Hypertension Mother   . Heart disease Mother   . Stroke Brother   . Cancer Maternal Grandfather     History  Substance Use Topics  . Smoking status: Current Every Day Smoker    Types: Cigarettes  . Smokeless tobacco: Never Used  . Alcohol Use: No    Allergies: No Known Allergies  Prescriptions prior to admission  Medication Sig Dispense Refill Last Dose  . ibuprofen (ADVIL,MOTRIN) 200 MG tablet Take 200 mg by mouth every 6 (six) hours as needed for mild pain.   prn  . nitrofurantoin, macrocrystal-monohydrate, (MACROBID) 100 MG capsule Take 1 capsule (100 mg total) by mouth 2 (two) times daily. (Patient not taking: Reported on 07/09/2014) 10 capsule 0 Not Taking at Unknown time  . ondansetron (ZOFRAN ODT) 8 MG disintegrating tablet 8mg  ODT q4 hours prn nausea (Patient not taking: Reported on 07/09/2014) 2 tablet 0 Not Taking at Unknown time    Review of Systems  Gastrointestinal: Positive for nausea and abdominal pain.  Genitourinary: Negative for dysuria.   Physical Exam   Blood pressure 115/74, pulse 104, temperature 98.1 F (36.7 C), temperature source Oral, resp. rate 18, height 5\' 6"  (1.676 m), weight 54.885 kg (121 lb), last menstrual period 05/10/2014.  Physical Exam  Nursing note and vitals reviewed. Constitutional: She is oriented to person, place, and time.  She appears well-developed and well-nourished. No distress.  HENT:  Head: Normocephalic and atraumatic.  Neck: Normal range of motion.  Cardiovascular: Normal rate.   Respiratory: Effort normal. No respiratory distress.  GI: Soft.  Musculoskeletal: Normal range of motion.  Neurological: She is alert and oriented to person, place, and time.  Skin: Skin is warm and dry.  Psychiatric: She has a normal mood and affect. Her behavior is normal. Judgment and thought content normal.    MAU Course  Procedures U/S reveals gestational sac at 3378w5d MDM Results for orders placed or performed during the hospital encounter of 07/09/14 (from the past 24 hour(s))  Urinalysis, Routine w reflex microscopic     Status: None   Collection Time: 07/09/14 11:20 AM  Result Value Ref Range   Color, Urine YELLOW YELLOW   APPearance CLEAR CLEAR   Specific Gravity, Urine 1.020 1.005 - 1.030   pH 6.0 5.0 - 8.0   Glucose, UA NEGATIVE NEGATIVE mg/dL   Hgb urine dipstick NEGATIVE NEGATIVE   Bilirubin Urine NEGATIVE NEGATIVE   Ketones, ur NEGATIVE NEGATIVE mg/dL   Protein, ur NEGATIVE NEGATIVE mg/dL   Urobilinogen, UA 0.2 0.0 - 1.0 mg/dL   Nitrite NEGATIVE NEGATIVE   Leukocytes, UA NEGATIVE NEGATIVE  Pregnancy, urine POC     Status: Abnormal   Collection Time: 07/09/14 11:27 AM  Result Value Ref Range   Preg Test,  Ur POSITIVE (A) NEGATIVE  CBC     Status: Abnormal   Collection Time: 07/09/14  1:08 PM  Result Value Ref Range   WBC 7.4 4.0 - 10.5 K/uL   RBC 4.02 3.87 - 5.11 MIL/uL   Hemoglobin 11.6 (L) 12.0 - 15.0 g/dL   HCT 16.1 (L) 09.6 - 04.5 %   MCV 84.1 78.0 - 100.0 fL   MCH 28.9 26.0 - 34.0 pg   MCHC 34.3 30.0 - 36.0 g/dL   RDW 40.9 81.1 - 91.4 %   Platelets 175 150 - 400 K/uL  hCG, quantitative, pregnancy     Status: Abnormal   Collection Time: 07/09/14  1:08 PM  Result Value Ref Range   hCG, Beta Chain, Quant, S 3205 (H) <5 mIU/mL     Assessment and Plan  Absent Menses IUP @  4+5   EcTopic Precautions Follow up Beta HCG in 48 hours Pregnancy confirmation letter  Leeroy Cha 07/09/2014, 2:23 PM

## 2014-07-09 NOTE — MAU Provider Note (Signed)
Pt has had blood drawn, is waiting in lobby to go to u/s. Pt updated on POC

## 2014-07-09 NOTE — MAU Note (Signed)
Says she missed her period and c/o  cramping for several weeks and breast tenderness. Has a headache. Denies vaginal bleeding/discharge.

## 2014-07-22 ENCOUNTER — Emergency Department (HOSPITAL_COMMUNITY)
Admission: EM | Admit: 2014-07-22 | Discharge: 2014-07-22 | Discharge status: Absent without leave | Payer: Medicaid Other | Source: Home / Self Care

## 2014-07-22 ENCOUNTER — Inpatient Hospital Stay (HOSPITAL_COMMUNITY): Payer: Medicaid Other

## 2014-07-22 ENCOUNTER — Inpatient Hospital Stay (HOSPITAL_COMMUNITY)
Admission: AD | Admit: 2014-07-22 | Discharge: 2014-07-22 | Disposition: A | Discharge status: Home / Self Care | Payer: Medicaid Other | Source: Ambulatory Visit | Attending: Obstetrics & Gynecology | Admitting: Obstetrics & Gynecology

## 2014-07-22 ENCOUNTER — Encounter (HOSPITAL_COMMUNITY): Payer: Self-pay | Admitting: *Deleted

## 2014-07-22 DIAGNOSIS — M549 Dorsalgia, unspecified: Secondary | ICD-10-CM

## 2014-07-22 DIAGNOSIS — Z3A01 Less than 8 weeks gestation of pregnancy: Secondary | ICD-10-CM | POA: Diagnosis not present

## 2014-07-22 DIAGNOSIS — O30001 Twin pregnancy, unspecified number of placenta and unspecified number of amniotic sacs, first trimester: Secondary | ICD-10-CM | POA: Diagnosis not present

## 2014-07-22 DIAGNOSIS — O210 Mild hyperemesis gravidarum: Secondary | ICD-10-CM | POA: Diagnosis not present

## 2014-07-22 DIAGNOSIS — R109 Unspecified abdominal pain: Secondary | ICD-10-CM | POA: Diagnosis present

## 2014-07-22 DIAGNOSIS — R102 Pelvic and perineal pain: Secondary | ICD-10-CM | POA: Insufficient documentation

## 2014-07-22 DIAGNOSIS — Z3A08 8 weeks gestation of pregnancy: Secondary | ICD-10-CM

## 2014-07-22 DIAGNOSIS — O26891 Other specified pregnancy related conditions, first trimester: Secondary | ICD-10-CM

## 2014-07-22 DIAGNOSIS — O9989 Other specified diseases and conditions complicating pregnancy, childbirth and the puerperium: Secondary | ICD-10-CM | POA: Insufficient documentation

## 2014-07-22 DIAGNOSIS — O219 Vomiting of pregnancy, unspecified: Secondary | ICD-10-CM | POA: Diagnosis not present

## 2014-07-22 DIAGNOSIS — Z87891 Personal history of nicotine dependence: Secondary | ICD-10-CM | POA: Diagnosis not present

## 2014-07-22 LAB — URINALYSIS, ROUTINE W REFLEX MICROSCOPIC
BILIRUBIN URINE: NEGATIVE
Glucose, UA: NEGATIVE mg/dL
Hgb urine dipstick: NEGATIVE
KETONES UR: NEGATIVE mg/dL
LEUKOCYTES UA: NEGATIVE
NITRITE: NEGATIVE
PROTEIN: NEGATIVE mg/dL
Specific Gravity, Urine: >1.03 — ABNORMAL HIGH (ref 1.005–1.030)
Urobilinogen, UA: 0.2 mg/dL (ref 0.0–1.0)
pH: 6 (ref 5.0–8.0)

## 2014-07-22 MED ORDER — PROMETHAZINE HCL 25 MG PO TABS: 12.5000 mg | ORAL_TABLET | Freq: Four times a day (QID) | ORAL | Status: DC | PRN

## 2014-07-22 MED ORDER — PROMETHAZINE HCL 25 MG/ML IJ SOLN
25.0000 mg | Freq: Once | INTRAMUSCULAR | Status: AC
  Administered 2014-07-22: 25 mg via INTRAMUSCULAR
  Filled 2014-07-22: qty 1

## 2014-07-22 NOTE — Discharge Instructions (Signed)
Prenatal Care Providers °[facility]    [facility] ° Phone- [phone]     Phone: [phone] °         °[facility]                      [facility] ° @[place]     Phone: [phone] ° Phone: [phone] °        [facility] °[facility]     Phone: [phone] ° Phone: [phone]   °        [facility] °[facility] [place]                hone: [phone] ° Phone: [phone] °        [facility] °Dr. Bernard Marshall      Phone: [phone] ° Phone: [phone] °        [facility] °[facility]                Phone: [phone] ° Women’s Health  ° Phone:[phone]    [facility] °         Phone: [phone] °[facility] °  Phone: [phone] °Safe Medications in Pregnancy  ° °Acne: °Benzoyl Peroxide °Salicylic Acid ° °Backache/Headache: °Tylenol: 2 regular strength every 4 hours OR °             2 Extra strength every 6 hours ° °Colds/Coughs/Allergies: °Benadryl (alcohol free) 25 mg every 6 hours as needed °Breath right strips °Claritin °Cepacol throat lozenges °Chloraseptic throat spray °Cold-Eeze- up to three times per day °Cough drops, alcohol free °Flonase (by prescription only) °Guaifenesin °Mucinex °Robitussin DM (plain only, alcohol free) °Saline nasal spray/drops °Sudafed (pseudoephedrine) & Actifed ** use only after [redacted] weeks gestation and if you do not have high blood pressure °Tylenol °Vicks Vaporub °Zinc lozenges °Zyrtec  ° °Constipation: °Colace °Ducolax suppositories °Fleet enema °Glycerin suppositories °Metamucil °Milk of magnesia °Miralax °Senokot °Smooth move tea ° °Diarrhea: °Kaopectate °Imodium A-D ° °*NO pepto Bismol ° °Hemorrhoids: °Anusol °Anusol HC °Preparation H °Tucks ° °Indigestion: °Tums °Maalox °Mylanta °Zantac  °Pepcid ° °Insomnia: °Benadryl (alcohol free) 25mg every 6 hours as needed °Tylenol  PM °Unisom, no Gelcaps ° °Leg Cramps: °Tums °MagGel ° °Nausea/Vomiting:  °Bonine °Dramamine °Emetrol °Ginger extract °Sea bands °Meclizine  °Nausea medication to take during pregnancy:  °Unisom (doxylamine succinate 25 mg tablets) Take one tablet daily at bedtime. If symptoms are not adequately controlled, the dose can be increased to a maximum recommended dose of two tablets daily (1/2 tablet in the morning, 1/2 tablet mid-afternoon and one at bedtime). °Vitamin B6 100mg tablets. Take one tablet twice a day (up to 200 mg per day). ° °Skin Rashes: °Aveeno products °Benadryl cream or 25mg every 6 hours as needed °Calamine Lotion °1% cortisone cream ° °Yeast infection: °Gyne-lotrimin 7 °Monistat 7 ° ° °**If taking multiple medications, please check labels to avoid duplicating the same active ingredients °**take medication as directed on the label °** Do not exceed 4000 mg of tylenol in 24 hours °**Do not take medications that contain aspirin or ibuprofen ° ° ° ° °

## 2014-07-22 NOTE — MAU Provider Note (Signed)
History     CSN: [id number]  Arrival date and time: 07/22/14 [id number]   First Provider Initiated Contact with Patient 07/22/14 2051      Chief Complaint  Patient presents with  . Morning Sickness   Abdominal Pain This is a new problem. The current episode started in the past 7 days. The onset quality is gradual. The problem occurs constantly. The problem has been unchanged. The pain is located in the suprapubic region. The pain is at a severity of 7/10. The quality of the pain is cramping. The abdominal pain radiates to the pelvis and perineum. Associated symptoms include nausea. Pertinent negatives include no dysuria, fever, frequency or vomiting. Nothing aggravates the pain. The pain is relieved by nothing. Treatments tried: ibuprofen  The treatment provided moderate relief.  Emesis  This is a new problem. The current episode started in the past 7 days. The problem occurs less than 2 times per day. The problem has been unchanged. The emesis has an appearance of stomach contents. There has been no fever. Associated symptoms include abdominal pain. Pertinent negatives include no fever. Risk factors: pregnancy  She has tried nothing for the symptoms.    Past Medical History  Diagnosis Date  . Anemia   . STD (female)   . Headache   . Seasonal allergies     Past Surgical History  Procedure Laterality Date  . No past surgeries      Family History  Problem Relation Age of Onset  . Cancer Mother   . Diabetes Mother   . Hypertension Mother   . Heart disease Mother   . Stroke Brother   . Cancer Maternal Grandfather     History  Substance Use Topics  . Smoking status: Former Smoker    Types: Cigarettes    Quit date: 07/21/2014  . Smokeless tobacco: Never Used  . Alcohol Use: No    Allergies: No Known Allergies  Prescriptions prior to admission  Medication Sig Dispense Refill Last Dose  . ibuprofen (ADVIL,MOTRIN) 200 MG tablet Take 200 mg by mouth every 6 (six) hours as  needed for cramping.   Past Week at Unknown time    Review of Systems  Constitutional: Negative for fever.  Gastrointestinal: Positive for nausea and abdominal pain. Negative for vomiting.  Genitourinary: Negative for dysuria, urgency and frequency.   Physical Exam   Blood pressure 119/66, pulse 61, temperature 98.3 F (36.8 C), temperature source Oral, resp. rate 18, height 5\' 5"  (1.651 m), weight 54.658 kg (120 lb 8 oz), last menstrual period 05/10/2014, SpO2 100 %.  Physical Exam  Nursing note and vitals reviewed. Constitutional: She is oriented to person, place, and time. She appears well-developed and well-nourished. No distress.  Cardiovascular: Normal rate.   Respiratory: Effort normal.  GI: Soft. There is no tenderness. There is no rebound.  Neurological: She is alert and oriented to person, place, and time.  Skin: Skin is warm and dry.  Psychiatric: She has a normal mood and affect.   Results for orders placed or performed during the hospital encounter of 07/22/14 (from the past 24 hour(s))  Urinalysis, Routine w reflex microscopic     Status: Abnormal   Collection Time: 07/22/14  8:01 PM  Result Value Ref Range   Color, Urine YELLOW YELLOW   APPearance CLEAR CLEAR   Specific Gravity, Urine >1.030 (H) 1.005 - 1.030   pH 6.0 5.0 - 8.0   Glucose, UA NEGATIVE NEGATIVE mg/dL   Hgb urine dipstick NEGATIVE NEGATIVE  Bilirubin Urine NEGATIVE NEGATIVE   Ketones, ur NEGATIVE NEGATIVE mg/dL   Protein, ur NEGATIVE NEGATIVE mg/dL   Urobilinogen, UA 0.2 0.0 - 1.0 mg/dL   Nitrite NEGATIVE NEGATIVE   Leukocytes, UA NEGATIVE NEGATIVE   [place] Ob Transvaginal  07/22/2014   CLINICAL DATA:  Followup viability. Lower abdominal pain. Estimated gestational age by LMP is 10 weeks 3 days. Quantitative beta HCG is not provided.  EXAM: TWIN OBSTETRIC <14WK US AND TRANSVAGINAL OB US  COMPARISON:  07/09/2014  FINDINGS: Twin intrauterine gestational is present. On transverse images of the uterus,  twin 1 is located to the maternal right and twin 2 to the maternal left.  TWIN 1  Intrauterine gestational sac: Visualized/normal in shape.  Yolk sac:  Present.  Embryo:  Present.  Cardiac Activity: Present.  Heart Rate: 121 bpm  CRL:  6.9  mm   6 w 4 d                  US EDC: 03/13/2015  TWIN 2  Intrauterine gestational sac: Visualized/normal in shape.  Yolk sac:  Present.  Embryo:  Present.  Cardiac Activity: Present.  Heart Rate: 120 bpm  CRL:  7  mm   6 w 5 d                  US EDC: 03/13/2015  Maternal uterus/adnexae: Uterus is slightly anteverted. No myometrial mass lesions. No subchorionic hemorrhage. Both ovaries are identified. Corpus luteal cyst demonstrated on the right. No abnormal adnexal masses. No free fluid collections.  IMPRESSION: Twin intrauterine gestational. Estimated gestational age by crown-rump length for twin 1 is 6 weeks 4 days and for twin 2 is 6 weeks 5 days. No acute complication is suggested.   Electronically Signed   By: Burman NievesWilliam  Stevens M.D.   On: 07/22/2014 21:30    MAU Course  Procedures  MDM   Assessment and Plan   1. Pregnancy related back pain in first trimester, antepartum   2. Pregnancy related pelvic pain in first trimester, antepartum   3. Nausea/vomiting in pregnancy    DC home RX phenergan #30, 0RF [facility]  First trimester precautions reviewed   Follow-up Information    Schedule an appointment as soon as possible for a [facility] with [facility].   Contact information:   [address] [place] [phone]       Tawnya CrookHogan, Phiona Ramnauth Donovan 07/22/2014, 8:52 PM

## 2014-07-22 NOTE — MAU Note (Signed)
Pt reports for the last 3 days she has been really nauseated. Does not have a RX and just wants something for nausea. States she is having lower abd pain, also has a white vaginal discharge. Pt missed her appointment for a repeat BHCG.

## 2014-07-28 ENCOUNTER — Encounter (HOSPITAL_COMMUNITY): Payer: Self-pay | Admitting: *Deleted

## 2014-07-28 ENCOUNTER — Inpatient Hospital Stay (HOSPITAL_COMMUNITY): Payer: No Typology Code available for payment source

## 2014-07-28 ENCOUNTER — Inpatient Hospital Stay (HOSPITAL_COMMUNITY)
Admission: AD | Admit: 2014-07-28 | Discharge: 2014-07-28 | Disposition: A | Discharge status: Home / Self Care | Payer: No Typology Code available for payment source | Source: Ambulatory Visit | Attending: Family Medicine | Admitting: Family Medicine

## 2014-07-28 DIAGNOSIS — O30041 Twin pregnancy, dichorionic/diamniotic, first trimester: Secondary | ICD-10-CM | POA: Diagnosis not present

## 2014-07-28 DIAGNOSIS — F17210 Nicotine dependence, cigarettes, uncomplicated: Secondary | ICD-10-CM | POA: Insufficient documentation

## 2014-07-28 DIAGNOSIS — O9989 Other specified diseases and conditions complicating pregnancy, childbirth and the puerperium: Secondary | ICD-10-CM | POA: Insufficient documentation

## 2014-07-28 DIAGNOSIS — O9A211 Injury, poisoning and certain other consequences of external causes complicating pregnancy, first trimester: Secondary | ICD-10-CM

## 2014-07-28 DIAGNOSIS — O26899 Other specified pregnancy related conditions, unspecified trimester: Secondary | ICD-10-CM

## 2014-07-28 DIAGNOSIS — Z3A01 Less than 8 weeks gestation of pregnancy: Secondary | ICD-10-CM | POA: Insufficient documentation

## 2014-07-28 DIAGNOSIS — R109 Unspecified abdominal pain: Secondary | ICD-10-CM

## 2014-07-28 DIAGNOSIS — O99331 Smoking (tobacco) complicating pregnancy, first trimester: Secondary | ICD-10-CM | POA: Insufficient documentation

## 2014-07-28 DIAGNOSIS — R1030 Lower abdominal pain, unspecified: Secondary | ICD-10-CM | POA: Insufficient documentation

## 2014-07-28 LAB — URINALYSIS, ROUTINE W REFLEX MICROSCOPIC
BILIRUBIN URINE: NEGATIVE
Glucose, UA: NEGATIVE mg/dL
Hgb urine dipstick: NEGATIVE
KETONES UR: NEGATIVE mg/dL
LEUKOCYTES UA: NEGATIVE
Nitrite: NEGATIVE
PH: 8.5 — AB (ref 5.0–8.0)
Protein, ur: NEGATIVE mg/dL
Specific Gravity, Urine: 1.02 (ref 1.005–1.030)
Urobilinogen, UA: 0.2 mg/dL (ref 0.0–1.0)

## 2014-07-28 LAB — WET PREP, GENITAL
CLUE CELLS WET PREP: NONE SEEN
Trich, Wet Prep: NONE SEEN
YEAST WET PREP: NONE SEEN

## 2014-07-28 MED ORDER — CONCEPT OB 130-92.4-1 MG PO CAPS: 1.0000 | ORAL_CAPSULE | Freq: Every day | ORAL | Status: DC

## 2014-07-28 MED ORDER — PROMETHAZINE HCL 25 MG PO TABS: 25.0000 mg | ORAL_TABLET | Freq: Four times a day (QID) | ORAL | Status: DC | PRN
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
## 2014-07-28 NOTE — Discharge Instructions (Signed)
Multiple Pregnancy A multiple pregnancy is when a woman is pregnant with two or more fetuses. Multiple pregnancies occur in about 3% of all births. The more babies in a pregnancy, the greater the risks of problems to the babies and mother. This includes death. Since the use of Assisted Reproductive Technology (ART) and medications that can induce ovulation, multiple fetal gestation has increased.  RISKS TO THE MOTHER  Preeclampsia and eclampsia.  Postpartum bleeding (hemorrhage).  Kidney infection (pyelonephritis).  Develop anemia.  Develop diabetes.  Liver complications.  A blood clot blocks the artery, or branch of the artery leading to the lungs (pulmonary embolism).  Blood clots in the leg.  Placental separation.  Higher rate of Cesarean Section deliveries.  Women over 64 years old have a higher rate of Downs Syndrome babies. RISKS TO THE BABY  Preterm labor with a premature baby.  Very low birth weight babies that are less than 3 pounds, especially with triplets or mores.  Premature rupture of the membranes.  Twin to twin blood transfusion with one baby anemic and the other baby with too much blood in its system. There may also be heart failure.  With triplets or more, one of the babies is at high risk for cerebral palsy or other neurologic problem.  There is a higher incidence of fetal death. CARE OF MOTHERS WITH MULTIPLE FETAL GESTATION Multiple pregnancies need more care and special prenatal care.  You will see your caregiver more often.  You will have more tests including ultrasounds, nonstress tests and blood tests.  You will have special tests done called amniocentesis and a biophysical profile.  You may be hospitalized more often during the pregnancy.  You will be encouraged to eat a balanced and healthy diet with vitamin and mineral supplements as directed.  You will be asked to get more rest and sleep to keep up your energy.  You will be asked to  restrict your daily activities, exercise, work, household chores and sexual activity.  If you have preterm labor with small babies, you will be given a steroid injection to help the babies lungs mature and do better when born.  The delivery may have to be by Cesarean delivery, especially if there are triplets or more.  The delivery should be in a hospital with an intensive care nursery and Neonatologists (pediatrician for high risk babies) to care for the newborn babies. HOME CARE INSTRUCTIONS   Follow the caregiver's recommendations regarding office visits, tests for you and the babies, diet, rest and medications.  Avoid a large amount of physical activity.  Arrange to have help after the babies are born and when you go home from the hospital.  Take classes on how to care for multiple babies before you deliver them. SEEK IMMEDIATE MEDICAL CARE IF:   You develop a temperature of 102 F (38.9 C) or higher.  You are leaking fluid from the vagina.  You develop vaginal bleeding.  You develop uterine contractions.  You develop a severe headache, severe upper abdominal pain, visual problems or excessive swelling of your face, hands and feet.  You develop severe back pain or leg pain.  You develop severe tiredness.  You develop chest pain.  You have shortness of breath, fall down or pass out. Document Released: 12/07/2007 Document Revised: 05/22/2011 Document Reviewed: 01/31/2013 [facility] Patient Information 2015 [place], [place]. This information is not intended to replace advice given to you by your health care provider. Make sure you discuss any questions you have with  your health care provider.  What Do I Need to Know About Injuries During Pregnancy? Injuries can happen during pregnancy. Minor falls and accidents usually do not harm you or your baby. However, any injury should be reported to your doctor. WHAT CAN I DO TO PROTECT MYSELF FROM INJURIES?  Remove rugs and loose  objects on the floor.  Wear comfortable shoes that have a good grip. Do not wear high-heeled shoes.  Always wear your seat belt. The lap belt should be below your belly. Always practice safe driving.  Do not ride on a motorcycle.  Do not participate in high-impact activities or sports.  Avoid:  Walking on wet or slippery floors.  Fires.  Starting fires.  Lifting heavy pots of boiling or hot liquids.  Fixing electrical problems.  Only take medicine as told by your doctor.  Know your blood type and the blood type of the baby's father.  Call your local emergency services (911 in the [place] if you are a victim of domestic violence or assault. For help and support, contact the [employer] Domestic Violence Hotline. WHEN SHOULD I GET HELP RIGHT AWAY?  You fall on your belly or have any high-impact accident or injury.  You have been a victim of domestic violence or any kind of violence.  You have been in a car accident.  You have bleeding from your vagina.  Fluid is leaking from your vagina.  You start to have belly cramping (contractions) or pain.  You feel weak or pass out (faint).  You start to throw up (vomit) after an injury.  You have been burned.  You have a stiff neck or neck pain.  You get a headache or have vision problems after an injury.  You do not feel the baby move or the baby is not moving as much as normal. Document Released: 04/01/2010 Document Revised: 07/14/2013 Document Reviewed: 12/04/2012 [facility] Patient Information 2015 [place]. This information is not intended to replace advice given to you by your health care provider. Make sure you discuss any questions you have with your health care provider.
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
## 2014-07-29 LAB — GC/CHLAMYDIA PROBE AMP (~~LOC~~) NOT AT ARMC
Chlamydia: NEGATIVE
Neisseria Gonorrhea: NEGATIVE

## 2014-07-31 ENCOUNTER — Encounter (HOSPITAL_COMMUNITY): Payer: Self-pay

## 2014-07-31 ENCOUNTER — Inpatient Hospital Stay (HOSPITAL_COMMUNITY)
Admission: AD | Admit: 2014-07-31 | Discharge: 2014-07-31 | Disposition: A | Discharge status: Home / Self Care | Payer: Medicaid Other | Source: Ambulatory Visit | Attending: Family Medicine | Admitting: Family Medicine

## 2014-07-31 ENCOUNTER — Inpatient Hospital Stay (HOSPITAL_COMMUNITY): Payer: Medicaid Other

## 2014-07-31 DIAGNOSIS — J028 Acute pharyngitis due to other specified organisms: Secondary | ICD-10-CM

## 2014-07-31 DIAGNOSIS — B9789 Other viral agents as the cause of diseases classified elsewhere: Secondary | ICD-10-CM

## 2014-07-31 DIAGNOSIS — O99331 Smoking (tobacco) complicating pregnancy, first trimester: Secondary | ICD-10-CM | POA: Insufficient documentation

## 2014-07-31 DIAGNOSIS — O211 Hyperemesis gravidarum with metabolic disturbance: Secondary | ICD-10-CM | POA: Insufficient documentation

## 2014-07-31 DIAGNOSIS — J029 Acute pharyngitis, unspecified: Secondary | ICD-10-CM | POA: Insufficient documentation

## 2014-07-31 DIAGNOSIS — O219 Vomiting of pregnancy, unspecified: Secondary | ICD-10-CM

## 2014-07-31 DIAGNOSIS — O30001 Twin pregnancy, unspecified number of placenta and unspecified number of amniotic sacs, first trimester: Secondary | ICD-10-CM

## 2014-07-31 DIAGNOSIS — F17210 Nicotine dependence, cigarettes, uncomplicated: Secondary | ICD-10-CM | POA: Insufficient documentation

## 2014-07-31 DIAGNOSIS — Z3A01 Less than 8 weeks gestation of pregnancy: Secondary | ICD-10-CM | POA: Insufficient documentation

## 2014-07-31 DIAGNOSIS — E860 Dehydration: Secondary | ICD-10-CM

## 2014-07-31 DIAGNOSIS — IMO0002 Reserved for concepts with insufficient information to code with codable children: Secondary | ICD-10-CM

## 2014-07-31 LAB — CBC WITH DIFFERENTIAL/PLATELET
Basophils Absolute: 0 10*3/uL (ref 0.0–0.1)
Basophils Relative: 0 % (ref 0–1)
Eosinophils Absolute: 0 10*3/uL (ref 0.0–0.7)
Eosinophils Relative: 0 % (ref 0–5)
HCT: 34 % — ABNORMAL LOW (ref 36.0–46.0)
Hemoglobin: 11.6 g/dL — ABNORMAL LOW (ref 12.0–15.0)
Lymphocytes Relative: 9 % — ABNORMAL LOW (ref 12–46)
Lymphs Abs: 1.3 10*3/uL (ref 0.7–4.0)
MCH: 28.7 pg (ref 26.0–34.0)
MCHC: 34.1 g/dL (ref 30.0–36.0)
MCV: 84.2 fL (ref 78.0–100.0)
Monocytes Absolute: 1.5 10*3/uL — ABNORMAL HIGH (ref 0.1–1.0)
Monocytes Relative: 10 % (ref 3–12)
Neutro Abs: 11.7 10*3/uL — ABNORMAL HIGH (ref 1.7–7.7)
Neutrophils Relative %: 81 % — ABNORMAL HIGH (ref 43–77)
Platelets: 190 10*3/uL (ref 150–400)
RBC: 4.04 MIL/uL (ref 3.87–5.11)
RDW: 13.1 % (ref 11.5–15.5)
WBC: 14.5 10*3/uL — ABNORMAL HIGH (ref 4.0–10.5)

## 2014-07-31 LAB — COMPREHENSIVE METABOLIC PANEL
ALT: 13 U/L — ABNORMAL LOW (ref 14–54)
AST: 14 U/L — ABNORMAL LOW (ref 15–41)
Albumin: 3.9 g/dL (ref 3.5–5.0)
Alkaline Phosphatase: 47 U/L (ref 38–126)
Anion gap: 9 (ref 5–15)
BUN: 11 mg/dL (ref 6–20)
CO2: 24 mmol/L (ref 22–32)
Calcium: 9.1 mg/dL (ref 8.9–10.3)
Chloride: 99 mmol/L — ABNORMAL LOW (ref 101–111)
Creatinine, Ser: 0.65 mg/dL (ref 0.44–1.00)
GFR calc Af Amer: >60 mL/min (ref >60)
GFR calc non Af Amer: >60 mL/min (ref >60)
Glucose, Bld: 88 mg/dL (ref 65–99)
Potassium: 3.6 mmol/L (ref 3.5–5.1)
Sodium: 132 mmol/L — ABNORMAL LOW (ref 135–145)
Total Bilirubin: 0.2 mg/dL — ABNORMAL LOW (ref 0.3–1.2)
Total Protein: 7.7 g/dL (ref 6.5–8.1)

## 2014-07-31 LAB — URINALYSIS, ROUTINE W REFLEX MICROSCOPIC
Bilirubin Urine: NEGATIVE
GLUCOSE, UA: NEGATIVE mg/dL
Ketones, ur: 15 mg/dL — AB
Leukocytes, UA: NEGATIVE
Nitrite: NEGATIVE
Protein, ur: NEGATIVE mg/dL
Specific Gravity, Urine: 1.025 (ref 1.005–1.030)
Urobilinogen, UA: 0.2 mg/dL (ref 0.0–1.0)
pH: 6.5 (ref 5.0–8.0)

## 2014-07-31 LAB — URINE MICROSCOPIC-ADD ON

## 2014-07-31 MED ORDER — DEXTROSE 5 % IN LACTATED RINGERS IV BOLUS
1000.0000 mL | Freq: Once | INTRAVENOUS | Status: AC
  Administered 2014-07-31: 1000 mL via INTRAVENOUS

## 2014-07-31 MED ORDER — SODIUM CHLORIDE 0.9 % IV SOLN
25.0000 mg | Freq: Once | INTRAVENOUS | Status: AC
  Administered 2014-07-31: 25 mg via INTRAVENOUS
  Filled 2014-07-31: qty 1
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
## 2014-07-31 NOTE — MAU Note (Addendum)
Whole body is just out of it. Don't know what is going on.  Back is hurting, - started yesterday. Still hurting today. Pain in lower abd on sides when she coughs. (mucous and blood) cough started day before yesterday. Throat is hurting. Unsure of fever, has been hot and cold since beginning of preg.  Feels off balance.  Hard to pee, "slow". Not really painful.  Vomited last night
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
## 2014-07-31 NOTE — Discharge Instructions (Signed)
Dehydration, Adult Dehydration is when you lose more fluids from the body than you take in. Vital organs like the kidneys, brain, and heart cannot function without a proper amount of fluids and salt. Any loss of fluids from the body can cause dehydration.  CAUSES   Vomiting.  Diarrhea.  Excessive sweating.  Excessive urine output.  Fever. SYMPTOMS  Mild dehydration  Thirst.  Dry lips.  Slightly dry mouth. Moderate dehydration  Very dry mouth.  Sunken eyes.  Skin does not bounce back quickly when lightly pinched and released.  Dark urine and decreased urine production.  Decreased tear production.  Headache. Severe dehydration  Very dry mouth.  Extreme thirst.  Rapid, weak pulse (more than 100 beats per minute at rest).  Cold hands and feet.  Not able to sweat in spite of heat and temperature.  Rapid breathing.  Blue lips.  Confusion and lethargy.  Difficulty being awakened.  Minimal urine production.  No tears. DIAGNOSIS  Your caregiver will diagnose dehydration based on your symptoms and your exam. Blood and urine tests will help confirm the diagnosis. The diagnostic evaluation should also identify the cause of dehydration. TREATMENT  Treatment of mild or moderate dehydration can often be done at home by increasing the amount of fluids that you drink. It is best to drink small amounts of fluid more often. Drinking too much at one time can make vomiting worse. Refer to the home care instructions below. Severe dehydration needs to be treated at the hospital where you will probably be given intravenous (IV) fluids that contain water and electrolytes. HOME CARE INSTRUCTIONS   Ask your caregiver about specific rehydration instructions.  Drink enough fluids to keep your urine clear or pale yellow.  Drink small amounts frequently if you have nausea and vomiting.  Eat as you normally do.  Avoid:  Foods or drinks high in sugar.  Carbonated  drinks.  Juice.  Extremely hot or cold fluids.  Drinks with caffeine.  Fatty, greasy foods.  Alcohol.  Tobacco.  Overeating.  Gelatin desserts.  Wash your hands well to avoid spreading bacteria and viruses.  Only take over-the-counter or prescription medicines for pain, discomfort, or fever as directed by your caregiver.  Ask your caregiver if you should continue all prescribed and over-the-counter medicines.  Keep all follow-up appointments with your caregiver. SEEK MEDICAL CARE IF:  You have abdominal pain and it increases or stays in one area (localizes).  You have a rash, stiff neck, or severe headache.  You are irritable, sleepy, or difficult to awaken.  You are weak, dizzy, or extremely thirsty. SEEK IMMEDIATE MEDICAL CARE IF:   You are unable to keep fluids down or you get worse despite treatment.  You have frequent episodes of vomiting or diarrhea.  You have blood or green matter (bile) in your vomit.  You have blood in your stool or your stool looks black and tarry.  You have not urinated in 6 to 8 hours, or you have only urinated a small amount of very dark urine.  You have a fever.  You faint. MAKE SURE YOU:   Understand these instructions.  Will watch your condition.  Will get help right away if you are not doing well or get worse. Document Released: 02/27/2005 Document Revised: 05/22/2011 Document Reviewed: 10/17/2010 [facility] Patient Information 2015 [place], [place]. This information is not intended to replace advice given to you by your health care provider. Make sure you discuss any questions you have with your health care  provider. Morning Sickness Morning sickness is when you feel sick to your stomach (nauseous) during pregnancy. This nauseous feeling may or may not come with vomiting. It often occurs in the morning but can be a problem any time of day. Morning sickness is most common during the first trimester, but it may continue  throughout pregnancy. While morning sickness is unpleasant, it is usually harmless unless you develop severe and continual vomiting (hyperemesis gravidarum). This condition requires more intense treatment.  CAUSES  The cause of morning sickness is not completely known but seems to be related to normal hormonal changes that occur in pregnancy. RISK FACTORS You are at greater risk if you:  Experienced nausea or vomiting before your pregnancy.  Had morning sickness during a previous pregnancy.  Are pregnant with more than one baby, such as twins. TREATMENT  Do not use any medicines (prescription, over-the-counter, or herbal) for morning sickness without first talking to your health care provider. Your health care provider may prescribe or recommend:  Vitamin B6 supplements.  Anti-nausea medicines.  The herbal medicine ginger. HOME CARE INSTRUCTIONS   Only take over-the-counter or prescription medicines as directed by your health care provider.  Taking multivitamins before getting pregnant can prevent or decrease the severity of morning sickness in most women.  Eat a piece of dry toast or unsalted crackers before getting out of bed in the morning.  Eat five or six small meals a day.  Eat dry and bland foods (rice, baked potato). Foods high in carbohydrates are often helpful.  Do not drink liquids with your meals. Drink liquids between meals.  Avoid greasy, fatty, and spicy foods.  Get someone to cook for you if the smell of any food causes nausea and vomiting.  If you feel nauseous after taking prenatal vitamins, take the vitamins at night or with a snack.  Snack on protein foods (nuts, yogurt, cheese) between meals if you are hungry.  Eat unsweetened gelatins for desserts.  Wearing an acupressure wristband (worn for sea sickness) may be helpful.  Acupuncture may be helpful.  Do not smoke.  Get a humidifier to keep the air in your house free of odors.  Get plenty of  fresh air. SEEK MEDICAL CARE IF:   Your home remedies are not working, and you need medicine.  You feel dizzy or lightheaded.  You are losing weight. SEEK IMMEDIATE MEDICAL CARE IF:   You have persistent and uncontrolled nausea and vomiting.  You pass out (faint). MAKE SURE YOU:  Understand these instructions.  Will watch your condition.  Will get help right away if you are not doing well or get worse. Document Released: 04/20/2006 Document Revised: 03/04/2013 Document Reviewed: 08/14/2012 [facility] Patient Information 2015 [place]. This information is not intended to replace advice given to you by your health care provider. Make sure you discuss any questions you have with your health care provider.

## 2014-09-16 ENCOUNTER — Inpatient Hospital Stay (HOSPITAL_COMMUNITY)
Admission: AD | Admit: 2014-09-16 | Discharge: 2014-09-16 | Disposition: A | Discharge status: Home / Self Care | Payer: Medicaid Other | Source: Ambulatory Visit | Attending: Obstetrics & Gynecology | Admitting: Obstetrics & Gynecology

## 2014-09-16 ENCOUNTER — Encounter (HOSPITAL_COMMUNITY): Payer: Self-pay | Admitting: *Deleted

## 2014-09-16 DIAGNOSIS — N949 Unspecified condition associated with female genital organs and menstrual cycle: Secondary | ICD-10-CM

## 2014-09-16 DIAGNOSIS — O9989 Other specified diseases and conditions complicating pregnancy, childbirth and the puerperium: Secondary | ICD-10-CM | POA: Diagnosis not present

## 2014-09-16 DIAGNOSIS — O99332 Smoking (tobacco) complicating pregnancy, second trimester: Secondary | ICD-10-CM | POA: Insufficient documentation

## 2014-09-16 DIAGNOSIS — Z3A14 14 weeks gestation of pregnancy: Secondary | ICD-10-CM | POA: Diagnosis not present

## 2014-09-16 DIAGNOSIS — R102 Pelvic and perineal pain: Secondary | ICD-10-CM | POA: Diagnosis not present

## 2014-09-16 DIAGNOSIS — R109 Unspecified abdominal pain: Secondary | ICD-10-CM | POA: Diagnosis present

## 2014-09-16 DIAGNOSIS — F17210 Nicotine dependence, cigarettes, uncomplicated: Secondary | ICD-10-CM | POA: Diagnosis not present

## 2014-09-16 LAB — URINALYSIS, ROUTINE W REFLEX MICROSCOPIC
BILIRUBIN URINE: NEGATIVE
GLUCOSE, UA: NEGATIVE mg/dL
HGB URINE DIPSTICK: NEGATIVE
Ketones, ur: NEGATIVE mg/dL
Leukocytes, UA: NEGATIVE
NITRITE: NEGATIVE
Protein, ur: NEGATIVE mg/dL
SPECIFIC GRAVITY, URINE: 1.01 (ref 1.005–1.030)
Urobilinogen, UA: 0.2 mg/dL (ref 0.0–1.0)
pH: 7 (ref 5.0–8.0)

## 2014-09-16 NOTE — Discharge Instructions (Signed)
[facility]    [facility]  [phone]- [phone]     Phone: [phone]          [facility]                      [facility]  @[place]     Phone: [phone]  Phone: [phone]         [facility]     Phone: [phone]  Phone: [phone]           [facility] [place]                hone: [phone]  Phone: [phone]         [facility]      Phone: [phone]  Phone: [phone]         [facility]                Phone: [phone]  [facility]   [address][phone]    [facility])          Phone: [phone] [facility]   Phone: [phone] Round Ligament Pain During Pregnancy Round ligament pain is a sharp pain or jabbing feeling often felt in the lower belly or groin area on one or both sides. It is one of the most common complaints during pregnancy and is considered a normal part of pregnancy. It is most often felt during the second trimester.  Here is what you need to know about round ligament pain, including some tips to help you feel better.  Causes of Round Ligament Pain  Several thick ligaments surround and support your womb (uterus) as it grows during pregnancy. One of them is called the round ligament.  The round ligament connects the front part of the womb to your groin, the area where your legs attach to your pelvis. The round ligament normally tightens and relaxes slowly.  As your baby and womb grow, the round ligament stretches. That makes it more likely to become strained.  Sudden movements can cause the ligament to tighten quickly, like a rubber band snapping. This causes a sudden and quick jabbing feeling.  Symptoms of Round Ligament Pain  Round ligament pain can be concerning and  uncomfortable. But it is considered normal as your body changes during pregnancy.  The symptoms of round ligament pain include a sharp, sudden spasm in the belly. It usually affects the right side, but it may happen on both sides. The pain only lasts a few seconds.  Exercise may cause the pain, as will rapid movements such as:  sneezing coughing laughing rolling over in bed standing up too quickly  Treatment of Round Ligament Pain  Here are some tips that may help reduce your discomfort:  Pain relief. Take over-the-counter acetaminophen for pain, if necessary. Ask your doctor if this is OK.  Exercise. Get plenty of exercise to keep your stomach (core) muscles strong. Doing stretching exercises or prenatal yoga can be helpful. Ask your doctor which exercises are safe for you and your baby.  A helpful exercise involves putting your hands and knees on the floor, lowering your head, and pushing your backside into the air.  Avoid sudden movements. Change positions slowly (such as standing up or sitting down) to avoid sudden movements that  may cause stretching and pain. ° °Flex your hips. Bend and flex your hips before you cough, sneeze, or laugh to avoid pulling on the ligaments. ° °Apply warmth. A heating pad or warm bath may be helpful. Ask your doctor if this is OK. Extreme heat can be dangerous to the baby. ° °You should try to modify your daily activity level and avoid positions that may worsen the condition. ° °When to Call the Doctor/Midwife ° °Always tell your doctor or midwife about any type of pain you have during pregnancy. Round ligament pain is quick and doesn't last long. ° °Call your health care provider immediately if you have: ° °severe pain °fever °chills °pain on urination °difficulty walking ° °Belly pain during pregnancy can be due to many different causes. It is important for your doctor to rule out more serious conditions, including pregnancy complications such as placenta abruption or  non-pregnancy illnesses such as: ° °inguinal hernia °appendicitis °stomach, liver, and kidney problems °Preterm labor pains may sometimes be mistaken for round ligament pain. ° °

## 2014-09-16 NOTE — MAU Note (Signed)
When rolled over this morning, had a sharp pulling pain in rt abd- into groin. ? Pulled muscle, though never had one before.

## 2014-09-16 NOTE — MAU Provider Note (Signed)
History     CSN: [id number]  Arrival date and time: 09/16/14 1719   First Provider Initiated Contact with Patient 09/16/14 2046      Chief Complaint  Patient presents with  . Abdominal Pain   HPI Comments: Ellen Camacho is a 29 y.o. [phone] at 442w4d who presents today with bilateral lower abdominal pains along both sides of the abdomen. This started yesterday. She denies any vaginal bleeding or LOF. She has been feeling some flutters that she thinks is the baby.   Abdominal Pain This is a new problem. The current episode started yesterday. The onset quality is gradual. The problem occurs intermittently. The problem has been unchanged. The pain is located in the LLQ and RLQ. The pain is at a severity of 7/10. The quality of the pain is sharp (pulling ). Associated symptoms include nausea. Pertinent negatives include no constipation, diarrhea, dysuria, fever, frequency or vomiting. The pain is aggravated by certain positions and movement. The pain is relieved by nothing. She has tried nothing for the symptoms.    Past Medical History  Diagnosis Date  . Anemia   . STD (female)   . Headache   . Seasonal allergies     Past Surgical History  Procedure Laterality Date  . No past surgeries      Family History  Problem Relation Age of Onset  . Cancer Mother   . Diabetes Mother   . Hypertension Mother   . Heart disease Mother   . Stroke Brother   . Cancer Maternal Grandfather     History  Substance Use Topics  . Smoking status: Current Some Day Smoker -- 0.25 packs/day    Types: Cigarettes    Last Attempt to Quit: 07/21/2014  . Smokeless tobacco: Never Used  . Alcohol Use: No    Allergies: No Known Allergies  Prescriptions prior to admission  Medication Sig Dispense Refill Last Dose  . acetaminophen (TYLENOL) 325 MG tablet Take 325 mg by mouth every 6 (six) hours as needed for mild pain or headache.   07/30/2014 at Unknown time  . Prenat w/o A Vit-FeFum-FePo-FA  (CONCEPT OB) 130-92.4-1 MG CAPS Take 1 tablet by mouth daily. (Patient not taking: Reported on 07/31/2014) 30 capsule 12 Not Taking at Unknown time  . promethazine (PHENERGAN) 25 MG tablet Take 1 tablet (25 mg total) by mouth every 6 (six) hours as needed for nausea. (Patient not taking: Reported on 07/31/2014) 30 tablet 0 Not Taking at Unknown time    Review of Systems  Constitutional: Negative for fever.  Gastrointestinal: Positive for nausea and abdominal pain. Negative for vomiting, diarrhea and constipation.  Genitourinary: Negative for dysuria, urgency and frequency.   Physical Exam   Blood pressure 108/70, pulse 73, temperature 98.5 F (36.9 C), temperature source Oral, resp. rate 18, weight 58.968 kg (130 lb), last menstrual period 05/10/2014.  Physical Exam  Nursing note and vitals reviewed. Constitutional: She is oriented to person, place, and time. She appears well-developed and well-nourished. No distress.  HENT:  Head: Normocephalic.  Cardiovascular: Normal rate.   Respiratory: Effort normal.  GI: Soft. There is no tenderness. There is no rebound.  Genitourinary:  +FHT baby A 150 +FHT baby B 160  Neurological: She is alert and oriented to person, place, and time.  Skin: Skin is warm and dry.  Psychiatric: She has a normal mood and affect.   Results for orders placed or performed during the hospital encounter of 09/16/14 (from the past 24 hour(s))  Urinalysis, Routine w reflex microscopic (not at [facility])     Status: Abnormal   Collection Time: 09/16/14  5:46 PM  Result Value Ref Range   Color, Urine YELLOW YELLOW   APPearance HAZY (A) CLEAR   Specific Gravity, Urine 1.010 1.005 - 1.030   pH 7.0 5.0 - 8.0   Glucose, UA NEGATIVE NEGATIVE mg/dL   Hgb urine dipstick NEGATIVE NEGATIVE   Bilirubin Urine NEGATIVE NEGATIVE   Ketones, ur NEGATIVE NEGATIVE mg/dL   Protein, ur NEGATIVE NEGATIVE mg/dL   Urobilinogen, UA 0.2 0.0 - 1.0 mg/dL   Nitrite NEGATIVE NEGATIVE    Leukocytes, UA NEGATIVE NEGATIVE    MAU Course  Procedures  MDM  Assessment and Plan   1. Round ligament pain    DC home Comfort measures reviewed  2nd Trimester precautions  Bleeding precautions PTL precautions  RX: none  Return to MAU as needed FU with OB as planned  Follow-up Information    Schedule an appointment as soon as possible for a visit with [facility] [place].   Contact information:   [address] [facility] [address] [phone]        Tawnya Crook 09/16/2014, 8:49 PM

## 2014-10-05 ENCOUNTER — Inpatient Hospital Stay (HOSPITAL_COMMUNITY)
Admission: AD | Admit: 2014-10-05 | Discharge: 2014-10-05 | Disposition: A | Discharge status: Home / Self Care | Payer: Medicaid Other | Source: Ambulatory Visit | Attending: Obstetrics & Gynecology | Admitting: Obstetrics & Gynecology

## 2014-10-05 ENCOUNTER — Encounter (HOSPITAL_COMMUNITY): Payer: Self-pay

## 2014-10-05 DIAGNOSIS — O9989 Other specified diseases and conditions complicating pregnancy, childbirth and the puerperium: Secondary | ICD-10-CM | POA: Diagnosis not present

## 2014-10-05 DIAGNOSIS — R109 Unspecified abdominal pain: Secondary | ICD-10-CM | POA: Diagnosis not present

## 2014-10-05 DIAGNOSIS — L0233 Carbuncle of buttock: Secondary | ICD-10-CM | POA: Diagnosis not present

## 2014-10-05 DIAGNOSIS — L02838 Carbuncle of other sites: Secondary | ICD-10-CM | POA: Diagnosis not present

## 2014-10-05 DIAGNOSIS — Z3A17 17 weeks gestation of pregnancy: Secondary | ICD-10-CM | POA: Insufficient documentation

## 2014-10-05 DIAGNOSIS — O30002 Twin pregnancy, unspecified number of placenta and unspecified number of amniotic sacs, second trimester: Secondary | ICD-10-CM | POA: Diagnosis not present

## 2014-10-05 DIAGNOSIS — O99332 Smoking (tobacco) complicating pregnancy, second trimester: Secondary | ICD-10-CM | POA: Diagnosis not present

## 2014-10-05 DIAGNOSIS — F17210 Nicotine dependence, cigarettes, uncomplicated: Secondary | ICD-10-CM | POA: Insufficient documentation

## 2014-10-05 LAB — URINALYSIS, ROUTINE W REFLEX MICROSCOPIC
BILIRUBIN URINE: NEGATIVE
Glucose, UA: NEGATIVE mg/dL
KETONES UR: NEGATIVE mg/dL
Leukocytes, UA: NEGATIVE
Nitrite: NEGATIVE
PROTEIN: NEGATIVE mg/dL
Urobilinogen, UA: 0.2 mg/dL (ref 0.0–1.0)
pH: 5.5 (ref 5.0–8.0)

## 2014-10-05 LAB — URINE MICROSCOPIC-ADD ON

## 2014-10-05 MED ORDER — CEPHALEXIN 500 MG PO CAPS: 500.0000 mg | ORAL_CAPSULE | Freq: Four times a day (QID) | ORAL | Status: DC

## 2014-10-05 NOTE — Discharge Instructions (Signed)

## 2014-10-05 NOTE — MAU Note (Signed)
Pt states here for sharp intermittent lower abdominal pain esp with movement x2 weeks. Denies bleeding or vag d/c changes. Also has boil near rectum that is constantly draining, and would like provider to look at that.

## 2014-10-05 NOTE — MAU Provider Note (Signed)
  History   G5P3013 at 17.2 wks in with sharp shooting abd pain that is intermittant in nature also c/o recurrant boil near rectus that has been I and D'ed numerous times. Pt has twin gestation this pregnancy.  CSN: [id number]  Arrival date and time: 10/05/14 1226   None     Chief Complaint  Patient presents with  . Abdominal Pain  . Recurrent Skin Infections   HPI  OB History    Gravida Para Term Preterm AB TAB SAB Ectopic Multiple Living   0 1 0 1 0 0 3      Past Medical History  Diagnosis Date  . Anemia   . STD (female)   . Headache   . Seasonal allergies     Past Surgical History  Procedure Laterality Date  . No past surgeries      Family History  Problem Relation Age of Onset  . Cancer Mother   . Diabetes Mother   . Hypertension Mother   . Heart disease Mother   . Stroke Brother   . Cancer Maternal Grandfather     History  Substance Use Topics  . Smoking status: Current Some Day Smoker -- 0.25 packs/day    Types: Cigarettes    Last Attempt to Quit: 07/21/2014  . Smokeless tobacco: Never Used  . Alcohol Use: No    Allergies: No Known Allergies  Prescriptions prior to admission  Medication Sig Dispense Refill Last Dose  . Prenat w/o A Vit-FeFum-FePo-FA (CONCEPT OB) 130-92.4-1 MG CAPS Take 1 tablet by mouth daily. 30 capsule 12 10/04/2014 at Unknown time    Review of Systems  Constitutional: Negative.   HENT: Negative.   Eyes: Negative.   Respiratory: Negative.   Cardiovascular: Negative.   Gastrointestinal: Positive for abdominal pain.  Genitourinary: Negative.   Musculoskeletal: Negative.   Skin: Negative.   Neurological: Negative.   Endo/Heme/Allergies: Negative.   Psychiatric/Behavioral: Negative.    Physical Exam   Blood pressure 117/54, pulse 82, temperature 98.1 F (36.7 C), temperature source Oral, resp. rate 18, height  (1.651 m), weight 139 lb 4 oz (63.163 kg), last menstrual period 05/10/2014.  Physical Exam   Constitutional: She is oriented to person, place, and time. She appears well-developed and well-nourished.  HENT:  Head: Normocephalic.  Eyes: Pupils are equal, round, and reactive to light.  Neck: Normal range of motion.  Cardiovascular: Normal rate, regular rhythm, normal heart sounds and intact distal pulses.   Respiratory: Effort normal and breath sounds normal.  GI: Soft. Bowel sounds are normal.  Genitourinary:  Carbuncle near rectum.  Musculoskeletal: Normal range of motion.  Neurological: She is alert and oriented to person, place, and time. She has normal reflexes.  Skin: Skin is warm and dry.  Psychiatric: She has a normal mood and affect. Her behavior is normal. Judgment and thought content normal.    MAU Course  Procedures  MDM Round lig pain Twin gestation Carbuncle   Assessment and Plan  U/s show viable twin gestation Carbuncle near rectum Keflex 500 QID x 10 days  Wyvonnia Dusky DARLENE 10/05/2014, 1:25 PM [id number]

## 2014-10-31 ENCOUNTER — Encounter (HOSPITAL_COMMUNITY): Payer: Self-pay | Admitting: *Deleted

## 2014-10-31 ENCOUNTER — Inpatient Hospital Stay (HOSPITAL_COMMUNITY)
Admission: AD | Admit: 2014-10-31 | Discharge: 2014-10-31 | Disposition: A | Discharge status: Home / Self Care | Payer: Medicaid Other | Source: Ambulatory Visit | Attending: Obstetrics & Gynecology | Admitting: Obstetrics & Gynecology

## 2014-10-31 DIAGNOSIS — N898 Other specified noninflammatory disorders of vagina: Secondary | ICD-10-CM | POA: Diagnosis present

## 2014-10-31 DIAGNOSIS — N949 Unspecified condition associated with female genital organs and menstrual cycle: Secondary | ICD-10-CM

## 2014-10-31 DIAGNOSIS — Z3A21 21 weeks gestation of pregnancy: Secondary | ICD-10-CM | POA: Diagnosis not present

## 2014-10-31 DIAGNOSIS — N760 Acute vaginitis: Secondary | ICD-10-CM | POA: Diagnosis not present

## 2014-10-31 DIAGNOSIS — O23592 Infection of other part of genital tract in pregnancy, second trimester: Secondary | ICD-10-CM

## 2014-10-31 DIAGNOSIS — R1030 Lower abdominal pain, unspecified: Secondary | ICD-10-CM | POA: Insufficient documentation

## 2014-10-31 DIAGNOSIS — M545 Low back pain, unspecified: Secondary | ICD-10-CM

## 2014-10-31 DIAGNOSIS — A499 Bacterial infection, unspecified: Secondary | ICD-10-CM

## 2014-10-31 DIAGNOSIS — M549 Dorsalgia, unspecified: Secondary | ICD-10-CM | POA: Insufficient documentation

## 2014-10-31 DIAGNOSIS — B9689 Other specified bacterial agents as the cause of diseases classified elsewhere: Secondary | ICD-10-CM

## 2014-10-31 DIAGNOSIS — O0932 Supervision of pregnancy with insufficient antenatal care, second trimester: Secondary | ICD-10-CM | POA: Diagnosis not present

## 2014-10-31 DIAGNOSIS — O30002 Twin pregnancy, unspecified number of placenta and unspecified number of amniotic sacs, second trimester: Secondary | ICD-10-CM | POA: Insufficient documentation

## 2014-10-31 DIAGNOSIS — O0930 Supervision of pregnancy with insufficient antenatal care, unspecified trimester: Secondary | ICD-10-CM

## 2014-10-31 DIAGNOSIS — O30009 Twin pregnancy, unspecified number of placenta and unspecified number of amniotic sacs, unspecified trimester: Secondary | ICD-10-CM

## 2014-10-31 DIAGNOSIS — O9989 Other specified diseases and conditions complicating pregnancy, childbirth and the puerperium: Secondary | ICD-10-CM

## 2014-10-31 LAB — DIFFERENTIAL
BASOS ABS: 0 10*3/uL (ref 0.0–0.1)
Basophils Relative: 0 % (ref 0–1)
EOS ABS: 0.1 10*3/uL (ref 0.0–0.7)
Eosinophils Relative: 0 % (ref 0–5)
Lymphocytes Relative: 17 % (ref 12–46)
Lymphs Abs: 2.3 10*3/uL (ref 0.7–4.0)
Monocytes Absolute: 1 10*3/uL (ref 0.1–1.0)
Monocytes Relative: 7 % (ref 3–12)
NEUTROS ABS: 10.2 10*3/uL — AB (ref 1.7–7.7)
NEUTROS PCT: 76 % (ref 43–77)

## 2014-10-31 LAB — WET PREP, GENITAL
Trich, Wet Prep: NONE SEEN
Yeast Wet Prep HPF POC: NONE SEEN

## 2014-10-31 LAB — CBC
HCT: 33.8 % — ABNORMAL LOW (ref 36.0–46.0)
Hemoglobin: 11.4 g/dL — ABNORMAL LOW (ref 12.0–15.0)
MCH: 29.2 pg (ref 26.0–34.0)
MCHC: 33.7 g/dL (ref 30.0–36.0)
MCV: 86.4 fL (ref 78.0–100.0)
PLATELETS: 212 10*3/uL (ref 150–400)
RBC: 3.91 MIL/uL (ref 3.87–5.11)
RDW: 14.4 % (ref 11.5–15.5)
WBC: 13.6 10*3/uL — AB (ref 4.0–10.5)

## 2014-10-31 LAB — URINALYSIS, ROUTINE W REFLEX MICROSCOPIC
Bilirubin Urine: NEGATIVE
Glucose, UA: NEGATIVE mg/dL
Hgb urine dipstick: NEGATIVE
KETONES UR: NEGATIVE mg/dL
LEUKOCYTES UA: NEGATIVE
NITRITE: NEGATIVE
Protein, ur: NEGATIVE mg/dL
Specific Gravity, Urine: 1.015 (ref 1.005–1.030)
Urobilinogen, UA: 0.2 mg/dL (ref 0.0–1.0)
pH: 8.5 — ABNORMAL HIGH (ref 5.0–8.0)

## 2014-10-31 LAB — TYPE AND SCREEN
ABO/RH(D): O POS
Antibody Screen: NEGATIVE

## 2014-10-31 LAB — ABO/RH: ABO/RH(D): O POS

## 2014-10-31 LAB — OB RESULTS CONSOLE GC/CHLAMYDIA: GC PROBE AMP, GENITAL: NEGATIVE

## 2014-10-31 MED ORDER — METRONIDAZOLE 500 MG PO TABS: 500.0000 mg | ORAL_TABLET | Freq: Two times a day (BID) | ORAL | Status: DC

## 2014-10-31 NOTE — MAU Provider Note (Signed)
MAU HISTORY AND PHYSICAL  Chief Complaint:  Back and groin pain and vaginal discharge  Ellen Camacho is a 29 y.o.  [phone] with IUP at [redacted]w[redacted]d, twin gestation, no prenatal care, presenting for the above. For a few weeks has experienced intermittent sharp pains bilaterally lower abdomen radiating to the sides of the groin worse w/ certain movements. Also has many weeks of intermittent low back pain, no radiation, no weakness or numbness or loss of bowel or bladder. For a few days she has had increased vaginal discharge. No STIs this pregnancy. No vaginal pain. No bleeding. Endorses positive fetal movement, no leakage of fluid. No contractions.  Menstrual History: OB History    Gravida Para Term Preterm AB TAB SAB Ectopic Multiple Living   0 1 0 1 0 0 3      Patient's last menstrual period was 05/10/2014 (approximate).      Past Medical History  Diagnosis Date  . Anemia   . STD (female)   . Headache   . Seasonal allergies     Past Surgical History  Procedure Laterality Date  . No past surgeries      Family History  Problem Relation Age of Onset  . Cancer Mother   . Diabetes Mother   . Hypertension Mother   . Heart disease Mother   . Stroke Brother   . Cancer Maternal Grandfather     Social History  Substance Use Topics  . Smoking status: Current Some Day Smoker -- 0.25 packs/day    Types: Cigarettes    Last Attempt to Quit: 07/21/2014  . Smokeless tobacco: Never Used  . Alcohol Use: No     No Known Allergies  Prescriptions prior to admission  Medication Sig Dispense Refill Last Dose  . Prenat w/o A Vit-FeFum-FePo-FA (CONCEPT OB) 130-92.4-1 MG CAPS Take 1 tablet by mouth daily. 30 capsule 12 Past Week at Unknown time  . cephALEXin (KEFLEX) 500 MG capsule Take 1 capsule (500 mg total) by mouth 4 (four) times daily. (Patient not taking: Reported on 10/31/2014) 40 capsule 0 Not Taking at Unknown time    Review of Systems - Negative except for what is mentioned  in HPI.  Physical Exam  Blood pressure 105/56, pulse 87, temperature 98.5 F (36.9 C), temperature source Oral, resp. rate 16, height  (1.651 m), weight 137 lb (62.143 kg), last menstrual period 05/10/2014. GENERAL: Well-developed, well-nourished female in no acute distress.  LUNGS: Clear to auscultation bilaterally.  HEART: Regular rate and rhythm. ABDOMEN: Soft, nontender, nondistended, gravid.  BACK: no cva tenderness EXTREMITIES: Nontender, no edema, 2+ distal pulses. Cervical Exam: closed/thick/high GU: moderate amount thin white discharge, normal cervix FHT:  Twin a 140, twin b 154 Contractions: intermittent   Labs: Results for orders placed or performed during the hospital encounter of 10/31/14 (from the past 24 hour(s))  Wet prep, genital   Collection Time: 10/31/14  5:52 PM  Result Value Ref Range   Yeast Wet Prep HPF POC NONE SEEN NONE SEEN   Trich, Wet Prep NONE SEEN NONE SEEN   Clue Cells Wet Prep HPF POC FEW (A) NONE SEEN   WBC, Wet Prep HPF POC FEW (A) NONE SEEN    Imaging Studies:  No results found.  Assessment/Plan: KYNADI DRAGOS is  29 y.o. [phone] at [redacted]w[redacted]d presents with bilateral groin pain, back pain, and increased vaginal discharge. Groin pain likely represents round ligament pain. Has increased vaginal discharge but no other symptoms of vaginities.  Some clue cells seen on wet prep that was otherwise negative; I discussed tx with the patient and she elected to pursue it. GC/chlamydia is pending, and urinalysis not suggestive of UTI. No CVA tenderness and no neurologic symptoms and no constitutional signs/symptoms; think low back pain is benign and 2/2 pregnancy. I am more concerned about her total absence of prenatal care and twin gestation. I have ordered a prenatal panel as well as anatomy scan and will refer her to our high risk clinic. - metronidazole 500 mg po bid for 7 days - PTL/PROM/UTI/pyelo return precautions    Silvano Bilis 8/20/20166:34  PM

## 2014-10-31 NOTE — MAU Note (Signed)
Patient presents at 64 weeks twin gestation with c/o lower right back pain X 2 weeks and abdominal cramping X 1 week. Fetuses active. Denies bleeding but states she has a white discharge that she first noticed a couple of days ago.

## 2014-11-01 ENCOUNTER — Emergency Department (HOSPITAL_COMMUNITY)
Admission: EM | Admit: 2014-11-01 | Discharge: 2014-11-01 | Disposition: A | Discharge status: Home / Self Care | Payer: Medicaid Other | Attending: Emergency Medicine | Admitting: Emergency Medicine

## 2014-11-01 ENCOUNTER — Emergency Department (HOSPITAL_COMMUNITY): Payer: Medicaid Other

## 2014-11-01 ENCOUNTER — Encounter (HOSPITAL_COMMUNITY): Payer: Self-pay | Admitting: Emergency Medicine

## 2014-11-01 DIAGNOSIS — Y9289 Other specified places as the place of occurrence of the external cause: Secondary | ICD-10-CM | POA: Insufficient documentation

## 2014-11-01 DIAGNOSIS — Z3A21 21 weeks gestation of pregnancy: Secondary | ICD-10-CM | POA: Insufficient documentation

## 2014-11-01 DIAGNOSIS — Y9389 Activity, other specified: Secondary | ICD-10-CM | POA: Diagnosis not present

## 2014-11-01 DIAGNOSIS — O9A212 Injury, poisoning and certain other consequences of external causes complicating pregnancy, second trimester: Secondary | ICD-10-CM | POA: Insufficient documentation

## 2014-11-01 DIAGNOSIS — S30811A Abrasion of abdominal wall, initial encounter: Secondary | ICD-10-CM | POA: Diagnosis not present

## 2014-11-01 DIAGNOSIS — F17210 Nicotine dependence, cigarettes, uncomplicated: Secondary | ICD-10-CM | POA: Diagnosis not present

## 2014-11-01 DIAGNOSIS — Y998 Other external cause status: Secondary | ICD-10-CM | POA: Insufficient documentation

## 2014-11-01 DIAGNOSIS — O99332 Smoking (tobacco) complicating pregnancy, second trimester: Secondary | ICD-10-CM | POA: Insufficient documentation

## 2014-11-01 DIAGNOSIS — Z862 Personal history of diseases of the blood and blood-forming organs and certain disorders involving the immune mechanism: Secondary | ICD-10-CM | POA: Diagnosis not present

## 2014-11-01 DIAGNOSIS — Z8619 Personal history of other infectious and parasitic diseases: Secondary | ICD-10-CM | POA: Diagnosis not present

## 2014-11-01 DIAGNOSIS — T148XXA Other injury of unspecified body region, initial encounter: Secondary | ICD-10-CM

## 2014-11-01 LAB — RPR: RPR: NONREACTIVE

## 2014-11-01 LAB — RUBELLA SCREEN: RUBELLA: 5.46 {index} (ref >0.99)

## 2014-11-01 LAB — HEPATITIS B SURFACE ANTIGEN: Hepatitis B Surface Ag: NEGATIVE

## 2014-11-01 LAB — HIV ANTIBODY (ROUTINE TESTING W REFLEX): HIV SCREEN 4TH GENERATION: NONREACTIVE
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
## 2014-11-01 NOTE — Progress Notes (Signed)
Pt unable to get assistance from [facility]. Social worker being called.

## 2014-11-01 NOTE — ED Notes (Signed)
SOCIAL WORKER AT BEDSIDE
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
## 2014-11-01 NOTE — ED Notes (Signed)
Pt transported to US

## 2014-11-01 NOTE — Progress Notes (Signed)
Social worker here talking with pt.

## 2014-11-01 NOTE — Progress Notes (Signed)
Waiting on ultrsound
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
## 2014-11-01 NOTE — Discharge Instructions (Signed)

## 2014-11-01 NOTE — Progress Notes (Signed)
Pt to ultrasound
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
## 2014-11-01 NOTE — Progress Notes (Signed)
Spoke with [employer]. Baby A FHR is 147 BPM and Baby B is 137 BPM. OBRR signing off.
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
## 2014-11-02 LAB — GC/CHLAMYDIA PROBE AMP (~~LOC~~) NOT AT ARMC
Chlamydia: NEGATIVE
Neisseria Gonorrhea: NEGATIVE

## 2014-11-10 ENCOUNTER — Encounter (HOSPITAL_COMMUNITY): Payer: Self-pay

## 2014-11-10 ENCOUNTER — Ambulatory Visit (HOSPITAL_COMMUNITY)
Admission: RE | Admit: 2014-11-10 | Discharge: 2014-11-10 | Disposition: A | Discharge status: Home / Self Care | Payer: Medicaid Other | Source: Ambulatory Visit | Attending: Obstetrics and Gynecology | Admitting: Obstetrics and Gynecology

## 2014-11-10 ENCOUNTER — Other Ambulatory Visit (HOSPITAL_COMMUNITY): Payer: Self-pay | Admitting: Obstetrics and Gynecology

## 2014-11-10 DIAGNOSIS — O0932 Supervision of pregnancy with insufficient antenatal care, second trimester: Secondary | ICD-10-CM | POA: Insufficient documentation

## 2014-11-10 DIAGNOSIS — O30042 Twin pregnancy, dichorionic/diamniotic, second trimester: Secondary | ICD-10-CM

## 2014-11-10 DIAGNOSIS — Z3A22 22 weeks gestation of pregnancy: Secondary | ICD-10-CM

## 2014-11-10 DIAGNOSIS — Z3689 Encounter for other specified antenatal screening: Secondary | ICD-10-CM

## 2014-11-10 DIAGNOSIS — O0930 Supervision of pregnancy with insufficient antenatal care, unspecified trimester: Secondary | ICD-10-CM

## 2014-11-10 DIAGNOSIS — Z36 Encounter for antenatal screening of mother: Secondary | ICD-10-CM | POA: Diagnosis not present

## 2014-11-12 ENCOUNTER — Ambulatory Visit (INDEPENDENT_AMBULATORY_CARE_PROVIDER_SITE_OTHER): Payer: Medicaid Other | Admitting: Family Medicine

## 2014-11-12 ENCOUNTER — Other Ambulatory Visit (HOSPITAL_COMMUNITY)
Admission: RE | Admit: 2014-11-12 | Discharge: 2014-11-12 | Disposition: A | Discharge status: Home / Self Care | Payer: Medicaid Other | Source: Ambulatory Visit | Attending: Family Medicine | Admitting: Family Medicine

## 2014-11-12 ENCOUNTER — Encounter: Payer: Self-pay | Admitting: Family Medicine

## 2014-11-12 ENCOUNTER — Telehealth: Payer: Self-pay | Admitting: *Deleted

## 2014-11-12 VITALS — BP 112/73 | HR 70 | Temp 98.3°F | Wt 143.0 lb

## 2014-11-12 DIAGNOSIS — Z124 Encounter for screening for malignant neoplasm of cervix: Secondary | ICD-10-CM | POA: Diagnosis not present

## 2014-11-12 DIAGNOSIS — O0992 Supervision of high risk pregnancy, unspecified, second trimester: Secondary | ICD-10-CM | POA: Diagnosis not present

## 2014-11-12 DIAGNOSIS — R87810 Cervical high risk human papillomavirus (HPV) DNA test positive: Secondary | ICD-10-CM | POA: Diagnosis present

## 2014-11-12 DIAGNOSIS — O30002 Twin pregnancy, unspecified number of placenta and unspecified number of amniotic sacs, second trimester: Secondary | ICD-10-CM

## 2014-11-12 DIAGNOSIS — O30009 Twin pregnancy, unspecified number of placenta and unspecified number of amniotic sacs, unspecified trimester: Secondary | ICD-10-CM

## 2014-11-12 DIAGNOSIS — Z01419 Encounter for gynecological examination (general) (routine) without abnormal findings: Secondary | ICD-10-CM | POA: Diagnosis present

## 2014-11-12 DIAGNOSIS — Z1151 Encounter for screening for human papillomavirus (HPV): Secondary | ICD-10-CM | POA: Diagnosis not present

## 2014-11-12 DIAGNOSIS — O99332 Smoking (tobacco) complicating pregnancy, second trimester: Secondary | ICD-10-CM

## 2014-11-12 DIAGNOSIS — O30049 Twin pregnancy, dichorionic/diamniotic, unspecified trimester: Secondary | ICD-10-CM

## 2014-11-12 DIAGNOSIS — O0932 Supervision of pregnancy with insufficient antenatal care, second trimester: Secondary | ICD-10-CM

## 2014-11-12 DIAGNOSIS — O99330 Smoking (tobacco) complicating pregnancy, unspecified trimester: Secondary | ICD-10-CM | POA: Insufficient documentation

## 2014-11-12 DIAGNOSIS — O0990 Supervision of high risk pregnancy, unspecified, unspecified trimester: Secondary | ICD-10-CM | POA: Insufficient documentation

## 2014-11-12 LAB — POCT URINALYSIS DIP (DEVICE)
Bilirubin Urine: NEGATIVE
Glucose, UA: NEGATIVE mg/dL
HGB URINE DIPSTICK: NEGATIVE
Ketones, ur: NEGATIVE mg/dL
Leukocytes, UA: NEGATIVE
NITRITE: NEGATIVE
PH: 6 (ref 5.0–8.0)
PROTEIN: 30 mg/dL — AB
Specific Gravity, Urine: >=1.03 (ref 1.005–1.030)
UROBILINOGEN UA: 0.2 mg/dL (ref 0.0–1.0)

## 2014-11-12 MED ORDER — ASPIRIN EC 81 MG PO TBEC: 81.0000 mg | DELAYED_RELEASE_TABLET | Freq: Every day | ORAL | Status: DC

## 2014-11-12 NOTE — Telephone Encounter (Signed)
Per Dr. Alvester Morin, pt needs to begin taking ASA 81 mg po daily. This is to reduce the risk of developing pre-eclampsia due to her having twin gestation.  Rx has been sent. I called pt and heard message stating that the person called is not accepting calls at this time. Please try again later.

## 2014-11-12 NOTE — Progress Notes (Signed)
Edema- feet  Pain/pressure- stomach    Weight gain of 25-35lbs New ob packet given  Anatomy US scheduled for Sept 19th @ 0830

## 2014-11-12 NOTE — Progress Notes (Signed)
Subjective:  Ellen Camacho is a 29 y.o. [phone] at [redacted]w[redacted]d being seen today for ongoing prenatal care.  Patient reports no complaints.  Contractions: Irritability.  Vag. Bleeding: None. Movement: Present. Denies leaking of fluid.   Patient is here to establish pregnancy care, previously seen in MAU only this pregnancy. She currently has currently concordant Di/Di pregnancy, has not regular PNC, smokes tobacco - 1+ ppd. She has not chronic medical conditions.   Complains of lower abdominal/hip pain- reports it is dull, located in front and right side of body. Worsens with movement and prolonged sitting. She reports no use of OTC medications. No dysuria/polyuria.    Balanced 10 point ROS was negative except those in HPI.   The following portions of the patient's history were reviewed and updated as appropriate: allergies, current medications, past family history, past medical history, past social history, past surgical history and problem list.   Objective:   Filed Vitals:   11/12/14 0813  BP: 112/73  Pulse: 70  Temp: 98.3 F (36.8 C)  Weight: 143 lb (64.864 kg)    Fetal Status: Fetal Heart Rate (bpm): 154/143   Movement: Present     General:  Alert, oriented and cooperative. Patient is in no acute distress.  Skin: Skin is warm and dry. No rash noted.   Cardiovascular: Normal heart rate noted  Respiratory: Normal respiratory effort, no problems with respiration noted  Abdomen: Soft, gravid, appropriate for gestational age. Pain/Pressure: Present     Pelvic: Vag. Bleeding: None     Cervical exam deferred        Extremities: Normal range of motion.  Edema: Trace  Mental Status: Normal mood and affect. Normal behavior. Normal judgment and thought content.   Urinalysis:      Assessment and Plan:  Pregnancy: G5P3013 at [redacted]w[redacted]d  1. Twin pregnancy, antepartum-- see below  2. No prenatal care in current pregnancy, second trimester Establishing care today. Met with SW and nutrition  today - Cytology - PAP - Culture, OB Urine - Prescript Monitor Profile(19)  3. Supervision of high risk pregnancy in second trimester Updated/added pregnancy box Pap today Urine Cx today  4. Dichorionic diamniotic twin pregnancy, antepartum Currently concordant, 16% growth discrepancy.Next [place] 9/19 Start ASA given higher risk of preeclampsia (current BP is wnl) Will need q4 week growth until 36wk and antenatal testing twice weekly at 32 week. IOL $RemoveB'@38'fZTAuKup$  weeks.   5. Tobacco use in pregnancy Pre-contemplative regarding quitting. Provided resources by SW.  6. Abdominal pain/hip pain: likely MSK. Recommended use of OTC tylenol and heat to area Preterm labor symptoms and general obstetric precautions including but not limited to vaginal bleeding, contractions, leaking of fluid and fetal movement were reviewed in detail with the patient. Please refer to After Visit Summary for other counseling recommendations.   Return in about 2 weeks (around 11/26/2014) for Routine prenatal care.   Caren Macadam, MD

## 2014-11-12 NOTE — Progress Notes (Signed)
Nutrition Note: 1st visit consult  Pt has gained 23 lbs @ [redacted]w[redacted]d which is wnl.  Pt reports eating 3 meals and 2-3 snacks/day.  Pt is taking PNV.  Pt reports no N & V or heartburn. NKFA.  Pt received verbal and written education on general nutrition during multiple gestation pregnancy.  Discussed weight gain goals of 37-54 lb total weight gain or 1.4 lb per week.  Pt agrees to continue PNV. Pt does not have WIC but plans to apply.  Does not plan to BF but agrees to think about it.  F/u as needed.  Carloyn Manner MS, RD, LDN

## 2014-11-12 NOTE — Patient Instructions (Signed)
Second Trimester of Pregnancy The second trimester is from week 13 through week 28, months 4 through 6. The second trimester is often a time when you feel your best. Your body has also adjusted to being pregnant, and you begin to feel better physically. Usually, morning sickness has lessened or quit completely, you may have more energy, and you may have an increase in appetite. The second trimester is also a time when the fetus is growing rapidly. At the end of the sixth month, the fetus is about 9 inches long and weighs about 1 pounds. You will likely begin to feel the baby move (quickening) between 18 and 20 weeks of the pregnancy. BODY CHANGES Your body goes through many changes during pregnancy. The changes vary from woman to woman.   Your weight will continue to increase. You will notice your lower abdomen bulging out.  You may begin to get stretch marks on your hips, abdomen, and breasts.  You may develop headaches that can be relieved by medicines approved by your health care provider.  You may urinate more often because the fetus is pressing on your bladder.  You may develop or continue to have heartburn as a result of your pregnancy.  You may develop constipation because certain hormones are causing the muscles that push waste through your intestines to slow down.  You may develop hemorrhoids or swollen, bulging veins (varicose veins).  You may have back pain because of the weight gain and pregnancy hormones relaxing your joints between the bones in your pelvis and as a result of a shift in weight and the muscles that support your balance.  Your breasts will continue to grow and be tender.  Your gums may bleed and may be sensitive to brushing and flossing.  Dark spots or blotches (chloasma, mask of pregnancy) may develop on your face. This will likely fade after the baby is born.  A dark line from your belly button to the pubic area (linea nigra) may appear. This will likely fade  after the baby is born.  You may have changes in your hair. These can include thickening of your hair, rapid growth, and changes in texture. Some women also have hair loss during or after pregnancy, or hair that feels dry or thin. Your hair will most likely return to normal after your baby is born. WHAT TO EXPECT AT YOUR PRENATAL VISITS During a routine prenatal visit:  You will be weighed to make sure you and the fetus are growing normally.  Your blood pressure will be taken.  Your abdomen will be measured to track your baby's growth.  The fetal heartbeat will be listened to.  Any test results from the previous visit will be discussed. Your health care provider may ask you:  How you are feeling.  If you are feeling the baby move.  If you have had any abnormal symptoms, such as leaking fluid, bleeding, severe headaches, or abdominal cramping.  If you have any questions. Other tests that may be performed during your second trimester include:  Blood tests that check for:  Low iron levels (anemia).  Gestational diabetes (between 24 and 28 weeks).  Rh antibodies.  Urine tests to check for infections, diabetes, or protein in the urine.  An ultrasound to confirm the proper growth and development of the baby.  An amniocentesis to check for possible genetic problems.  Fetal screens for spina bifida and Down syndrome. HOME CARE INSTRUCTIONS   Avoid all smoking, herbs, alcohol, and unprescribed   drugs. These chemicals affect the formation and growth of the baby.  Follow your health care provider's instructions regarding medicine use. There are medicines that are either safe or unsafe to take during pregnancy.  Exercise only as directed by your health care provider. Experiencing uterine cramps is a good sign to stop exercising.  Continue to eat regular, healthy meals.  Wear a good support bra for breast tenderness.  Do not use hot tubs, steam rooms, or saunas.  Wear your  seat belt at all times when driving.  Avoid raw meat, uncooked cheese, cat litter boxes, and soil used by cats. These carry germs that can cause birth defects in the baby.  Take your prenatal vitamins.  Try taking a stool softener (if your health care provider approves) if you develop constipation. Eat more high-fiber foods, such as fresh vegetables or fruit and whole grains. Drink plenty of fluids to keep your urine clear or pale yellow.  Take warm sitz baths to soothe any pain or discomfort caused by hemorrhoids. Use hemorrhoid cream if your health care provider approves.  If you develop varicose veins, wear support hose. Elevate your feet for 15 minutes, 3-4 times a day. Limit salt in your diet.  Avoid heavy lifting, wear low heel shoes, and practice good posture.  Rest with your legs elevated if you have leg cramps or low back pain.  Visit your dentist if you have not gone yet during your pregnancy. Use a soft toothbrush to brush your teeth and be gentle when you floss.  A sexual relationship may be continued unless your health care provider directs you otherwise.  Continue to go to all your prenatal visits as directed by your health care provider. SEEK MEDICAL CARE IF:   You have dizziness.  You have mild pelvic cramps, pelvic pressure, or nagging pain in the abdominal area.  You have persistent nausea, vomiting, or diarrhea.  You have a bad smelling vaginal discharge.  You have pain with urination. SEEK IMMEDIATE MEDICAL CARE IF:   You have a fever.  You are leaking fluid from your vagina.  You have spotting or bleeding from your vagina.  You have severe abdominal cramping or pain.  You have rapid weight gain or loss.  You have shortness of breath with chest pain.  You notice sudden or extreme swelling of your face, hands, ankles, feet, or legs.  You have not felt your baby move in over an hour.  You have severe headaches that do not go away with  medicine.  You have vision changes. Document Released: 02/21/2001 Document Revised: 03/04/2013 Document Reviewed: 04/30/2012 [employer] Patient Information 2015 [employer]. This information is not intended to replace advice given to you by your health care provider. Make sure you discuss any questions you have with your health care provider.  

## 2014-11-13 ENCOUNTER — Encounter: Payer: Self-pay | Admitting: *Deleted

## 2014-11-13 LAB — CULTURE, OB URINE
Colony Count: NO GROWTH
ORGANISM ID, BACTERIA: NO GROWTH

## 2014-11-13 NOTE — Telephone Encounter (Signed)
Called patient and heard message that the person you are calling cannot accept calls at this time. Will send certified letter to patient with recommendations.

## 2014-11-16 LAB — CANNABANOIDS (GC/LC/MS), URINE: THC-COOH UR CONFIRM: 234 ng/mL — AB (ref <5)

## 2014-11-17 ENCOUNTER — Encounter: Payer: Self-pay | Admitting: Family Medicine

## 2014-11-17 DIAGNOSIS — O99320 Drug use complicating pregnancy, unspecified trimester: Secondary | ICD-10-CM | POA: Insufficient documentation

## 2014-11-17 LAB — PRESCRIPTION MONITORING PROFILE (19 PANEL)
Amphetamine/Meth: NEGATIVE ng/mL
Barbiturate Screen, Urine: NEGATIVE ng/mL
Benzodiazepine Screen, Urine: NEGATIVE ng/mL
Buprenorphine, Urine: NEGATIVE ng/mL
CARISOPRODOL, URINE: NEGATIVE ng/mL
COCAINE METABOLITES: NEGATIVE ng/mL
CREATININE, URINE: 206.14 mg/dL (ref >20.0)
ECSTASY: NEGATIVE ng/mL
FENTANYL URINE: NEGATIVE ng/mL
Meperidine, Ur: NEGATIVE ng/mL
Methadone Screen, Urine: NEGATIVE ng/mL
Methaqualone: NEGATIVE ng/mL
NITRITES URINE, INITIAL: NEGATIVE ug/mL
OPIATE SCREEN, URINE: NEGATIVE ng/mL
OXYCODONE SCRN UR: NEGATIVE ng/mL
PH URINE, INITIAL: 6.2 pH (ref 4.5–8.9)
PROPOXYPHENE: NEGATIVE ng/mL
Phencyclidine, Ur: NEGATIVE ng/mL
TRAMADOL UR: NEGATIVE ng/mL
Tapentadol, urine: NEGATIVE ng/mL
Zolpidem, Urine: NEGATIVE ng/mL

## 2014-11-18 LAB — CYTOLOGY - PAP

## 2014-11-21 ENCOUNTER — Encounter: Payer: Self-pay | Admitting: Family Medicine

## 2014-11-21 DIAGNOSIS — R87811 Vaginal high risk human papillomavirus (HPV) DNA test positive: Secondary | ICD-10-CM | POA: Insufficient documentation

## 2014-11-26 ENCOUNTER — Ambulatory Visit (INDEPENDENT_AMBULATORY_CARE_PROVIDER_SITE_OTHER): Payer: Medicaid Other | Admitting: Obstetrics and Gynecology

## 2014-11-26 VITALS — BP 123/69 | HR 79 | Temp 98.2°F | Wt 149.8 lb

## 2014-11-26 DIAGNOSIS — O30009 Twin pregnancy, unspecified number of placenta and unspecified number of amniotic sacs, unspecified trimester: Secondary | ICD-10-CM

## 2014-11-26 DIAGNOSIS — O0992 Supervision of high risk pregnancy, unspecified, second trimester: Secondary | ICD-10-CM | POA: Diagnosis not present

## 2014-11-26 DIAGNOSIS — O30002 Twin pregnancy, unspecified number of placenta and unspecified number of amniotic sacs, second trimester: Secondary | ICD-10-CM | POA: Diagnosis not present

## 2014-11-26 LAB — POCT URINALYSIS DIP (DEVICE)
BILIRUBIN URINE: NEGATIVE
GLUCOSE, UA: NEGATIVE mg/dL
Hgb urine dipstick: NEGATIVE
KETONES UR: NEGATIVE mg/dL
Leukocytes, UA: NEGATIVE
Nitrite: NEGATIVE
PROTEIN: NEGATIVE mg/dL
SPECIFIC GRAVITY, URINE: 1.025 (ref 1.005–1.030)
Urobilinogen, UA: 0.2 mg/dL (ref 0.0–1.0)
pH: 6.5 (ref 5.0–8.0)

## 2014-11-26 MED ORDER — NICOTINE POLACRILEX 2 MG MT GUM: 2.0000 mg | CHEWING_GUM | OROMUCOSAL | Status: DC | PRN

## 2014-11-26 MED ORDER — ACYCLOVIR 400 MG PO TABS: 400.0000 mg | ORAL_TABLET | Freq: Three times a day (TID) | ORAL | Status: DC

## 2014-11-26 MED ORDER — NICOTINE 14 MG/24HR TD PT24: 14.0000 mg | MEDICATED_PATCH | Freq: Every day | TRANSDERMAL | Status: DC

## 2014-11-26 NOTE — Progress Notes (Signed)
Subjective:  Ellen Camacho is a 29 y.o. [phone] at [redacted]w[redacted]d being seen today for ongoing prenatal care.  Patient reports no complaints.  No bleeding, ctxns, or leakage of fluid.   The following portions of the patient's history were reviewed and updated as appropriate: allergies, current medications, past family history, past medical history, past social history, past surgical history and problem list.   Objective:  There were no vitals filed for this visit.  Fetal Status:   Fundal Height: 28 cm       General:  Alert, oriented and cooperative. Patient is in no acute distress.  Skin: Skin is warm and dry. No rash noted.   Cardiovascular: Normal heart rate noted  Respiratory: Normal respiratory effort, no problems with respiration noted  Abdomen: Soft, gravid, appropriate for gestational age.       Pelvic:       Cervical exam deferred        Extremities: Normal range of motion.     Mental Status: Normal mood and affect. Normal behavior. Normal judgment and thought content.   Urinalysis:      Assessment and Plan:  Pregnancy: [id number] at [redacted]w[redacted]d  # Twin pregnancy - currently concordent, continuing growth scans - 1-hour, cbc, hiv, and rpr today - flu vaccine today - f/u 4 weeks  There are no diagnoses linked to this encounter. Preterm labor symptoms and general obstetric precautions including but not limited to vaginal bleeding, contractions, leaking of fluid and fetal movement were reviewed in detail with the patient. Please refer to After Visit Summary for other counseling recommendations.  Return in about 4 weeks (around 12/24/2014).   Kathrynn Running, MD

## 2014-11-27 ENCOUNTER — Other Ambulatory Visit: Payer: Self-pay | Admitting: Family Medicine

## 2014-11-27 DIAGNOSIS — O30049 Twin pregnancy, dichorionic/diamniotic, unspecified trimester: Secondary | ICD-10-CM

## 2014-11-27 DIAGNOSIS — O30009 Twin pregnancy, unspecified number of placenta and unspecified number of amniotic sacs, unspecified trimester: Secondary | ICD-10-CM

## 2014-11-27 DIAGNOSIS — Z3A25 25 weeks gestation of pregnancy: Secondary | ICD-10-CM

## 2014-11-27 LAB — CBC
HEMATOCRIT: 31 % — AB (ref 36.0–46.0)
HEMOGLOBIN: 10.5 g/dL — AB (ref 12.0–15.0)
MCH: 28.5 pg (ref 26.0–34.0)
MCHC: 33.9 g/dL (ref 30.0–36.0)
MCV: 84 fL (ref 78.0–100.0)
MPV: 11 fL (ref 8.6–12.4)
Platelets: 220 10*3/uL (ref 150–400)
RBC: 3.69 MIL/uL — AB (ref 3.87–5.11)
RDW: 14.3 % (ref 11.5–15.5)
WBC: 11.9 10*3/uL — ABNORMAL HIGH (ref 4.0–10.5)

## 2014-11-27 LAB — GLUCOSE TOLERANCE, 1 HOUR (50G) W/O FASTING: GLUCOSE 1 HOUR GTT: 88 mg/dL (ref 70–140)

## 2014-11-27 LAB — RPR

## 2014-11-28 LAB — HIV ANTIBODY (ROUTINE TESTING W REFLEX): HIV 1&2 Ab, 4th Generation: NONREACTIVE

## 2014-11-30 ENCOUNTER — Ambulatory Visit (HOSPITAL_COMMUNITY)
Admission: RE | Admit: 2014-11-30 | Discharge: 2014-11-30 | Disposition: A | Discharge status: Home / Self Care | Payer: Medicaid Other | Source: Ambulatory Visit | Attending: Obstetrics and Gynecology | Admitting: Obstetrics and Gynecology

## 2014-11-30 ENCOUNTER — Encounter (HOSPITAL_COMMUNITY): Payer: Self-pay

## 2014-11-30 ENCOUNTER — Other Ambulatory Visit: Payer: Self-pay | Admitting: Family Medicine

## 2014-11-30 DIAGNOSIS — O30009 Twin pregnancy, unspecified number of placenta and unspecified number of amniotic sacs, unspecified trimester: Secondary | ICD-10-CM

## 2014-11-30 DIAGNOSIS — O30049 Twin pregnancy, dichorionic/diamniotic, unspecified trimester: Secondary | ICD-10-CM | POA: Diagnosis not present

## 2014-11-30 DIAGNOSIS — Z3A25 25 weeks gestation of pregnancy: Secondary | ICD-10-CM

## 2014-12-21 ENCOUNTER — Other Ambulatory Visit (HOSPITAL_COMMUNITY): Payer: Self-pay | Admitting: Maternal and Fetal Medicine

## 2014-12-21 ENCOUNTER — Ambulatory Visit (HOSPITAL_COMMUNITY)
Admission: RE | Admit: 2014-12-21 | Discharge: 2014-12-21 | Disposition: A | Discharge status: Home / Self Care | Payer: Medicaid Other | Source: Ambulatory Visit | Attending: Obstetrics and Gynecology | Admitting: Obstetrics and Gynecology

## 2014-12-21 DIAGNOSIS — O30043 Twin pregnancy, dichorionic/diamniotic, third trimester: Secondary | ICD-10-CM | POA: Insufficient documentation

## 2014-12-21 DIAGNOSIS — Z3A28 28 weeks gestation of pregnancy: Secondary | ICD-10-CM | POA: Diagnosis not present

## 2014-12-21 DIAGNOSIS — O365931 Maternal care for other known or suspected poor fetal growth, third trimester, fetus 1: Secondary | ICD-10-CM

## 2014-12-21 DIAGNOSIS — O0930 Supervision of pregnancy with insufficient antenatal care, unspecified trimester: Secondary | ICD-10-CM

## 2014-12-21 DIAGNOSIS — O0933 Supervision of pregnancy with insufficient antenatal care, third trimester: Secondary | ICD-10-CM | POA: Insufficient documentation

## 2014-12-21 DIAGNOSIS — O30049 Twin pregnancy, dichorionic/diamniotic, unspecified trimester: Secondary | ICD-10-CM

## 2014-12-24 ENCOUNTER — Ambulatory Visit (INDEPENDENT_AMBULATORY_CARE_PROVIDER_SITE_OTHER): Payer: Medicaid Other | Admitting: Obstetrics & Gynecology

## 2014-12-24 VITALS — BP 115/67 | HR 77 | Temp 98.3°F | Wt 152.6 lb

## 2014-12-24 DIAGNOSIS — O30003 Twin pregnancy, unspecified number of placenta and unspecified number of amniotic sacs, third trimester: Secondary | ICD-10-CM | POA: Diagnosis not present

## 2014-12-24 DIAGNOSIS — O99333 Smoking (tobacco) complicating pregnancy, third trimester: Secondary | ICD-10-CM | POA: Diagnosis not present

## 2014-12-24 DIAGNOSIS — O30049 Twin pregnancy, dichorionic/diamniotic, unspecified trimester: Secondary | ICD-10-CM | POA: Diagnosis not present

## 2014-12-24 DIAGNOSIS — O0933 Supervision of pregnancy with insufficient antenatal care, third trimester: Secondary | ICD-10-CM | POA: Diagnosis not present

## 2014-12-24 LAB — POCT URINALYSIS DIP (DEVICE)
Bilirubin Urine: NEGATIVE
Glucose, UA: NEGATIVE mg/dL
Hgb urine dipstick: NEGATIVE
KETONES UR: NEGATIVE mg/dL
Leukocytes, UA: NEGATIVE
Nitrite: NEGATIVE
PH: 7 (ref 5.0–8.0)
PROTEIN: NEGATIVE mg/dL
SPECIFIC GRAVITY, URINE: 1.015 (ref 1.005–1.030)
Urobilinogen, UA: 0.2 mg/dL (ref 0.0–1.0)

## 2014-12-24 NOTE — Patient Instructions (Signed)
Multiple Pregnancy °Having a multiple pregnancy means you are carrying twins, triplets, or more. The majority of multiple pregnancies are twins. Naturally conceiving triplets or more (higher-order multiples) is quite rare. °Multiple pregnancies can sometimes be riskier than single pregnancies. You are more likely to have certain problems. For example, you are at higher risk for high blood pressure during pregnancy (preeclampsia). You may need to have more frequent appointments for prenatal care.  °CAUSES  °Sometimes your body releases more than one egg at a time. Having more than one egg fertilized by more than one sperm is the most common type of multiple pregnancy. Twins produced this way are fraternal. They are no more alike than other siblings. °Multiple pregnancies also result when one sperm fertilizes one egg and then that egg divides into more than one embryo. These are identical twins or triplets. Identical multiples are always the same gender and look very much alike. °RISK FACTORS °You may be at higher risk for having a multiple pregnancy if: °· You are older than 35. °· You have already had four or more children. °· You have a family history of multiple pregnancy. °· You have had fertility treatment. °SIGNS AND SYMPTOMS °Early signs and symptoms of multiple pregnancy include: °· Rapid weight gain in the first 3 months of pregnancy (first trimester). °· The uterus measuring larger than normal for your stage of pregnancy. °· More severe nausea. °· A higher-than-normal level of human chorionic gonadotropin (HCG). This is a hormone that your body produces in early pregnancy. °DIAGNOSIS  °Your health care provider may suspect a multiple pregnancy from your symptoms and a physical exam. The results of your HCG blood test may also suggest a multiple pregnancy. Your health care provider will confirm that you are carrying multiples by doing an ultrasound exam. This exam involves using sound waves and a computer to  get an image of the inside of your uterus. An ultrasound exam can confirm a multiple pregnancy after you have been pregnant for 6-8 weeks. °TREATMENT  °If you have a multiple pregnancy, you may need: °· Early screening for possible birth defects or genetic abnormalities. °· Frequent pelvic exams to check for signs of early labor. °· Frequent ultrasound exams during the second trimester. °· Frequent checks for high blood pressure and for protein in your urine. These are signs of preeclampsia. °· An anti-inflammatory medicine (corticosteroid) if you go into early labor. This medicine helps your babies' lungs mature. °· Magnesium sulfate to prevent cerebral palsy in your babies if you are expected to deliver before 32 weeks. °· Cesarean delivery if vaginal delivery is too dangerous. °HOME CARE INSTRUCTIONS  °Because your pregnancy may be considered high risk, you have to work closely with your health care team. You may also need to make some lifestyle changes. These may include the following: °· Increase your nutrition. °¨ Gaining about 40-50 lb (18-23 kg) is recommended when you are pregnant with multiples. Try to gain 1 lb (0.5 kg) a week for the first 20 weeks of your pregnancy. °¨ Your health care provider will let you know how many calories you should add to your diet each day. °¨ Eat healthy snacks often throughout the day. This can add calories and reduce nausea. °· Drink enough fluid to keep your urine clear or pale yellow. °· Take your prenatal vitamins. °· Take 1500-2000 mg of calcium daily starting at the 20th week of pregnancy until you deliver your babies. °· By 20-24 weeks, you may need to   limit your activities. °¨ Avoid strenuous activity and work. °¨ Ask your health care provider when you should stop having sexual intercourse. °¨ Rest often. °· Do not smoke. °· Do not drink alcohol. °· Arrange for extra help around the house. °· Keep all your prenatal appointments. °SEEK MEDICAL CARE IF: °· You have  dizziness. °· You have mild pelvic cramps, pelvic pressure, or nagging pain in the abdominal or low back area. °· You have persistent nausea, vomiting, or diarrhea. °· You have a bad smelling vaginal discharge. °· You have pain with urination. °· You are having trouble gaining weight. °· You notice increased swelling in your face, hands, legs, or ankles. °· You have a fever. °SEEK IMMEDIATE MEDICAL CARE IF:  °· You are leaking fluid from your vagina. °· You have spotting or bleeding from your vagina. °· You have severe abdominal cramping or pain. °· You have rapid weight gain or loss. °· You vomit blood or material that looks like coffee grounds. °· You are exposed to German measles and have never had them. °· You are exposed to fifth disease or chickenpox. °· You develop a severe headache. °· You have shortness of breath. °  °This information is not intended to replace advice given to you by your health care provider. Make sure you discuss any questions you have with your health care provider. °  °Document Released: 12/07/2007 Document Revised: 03/20/2014 Document Reviewed: 01/31/2013 °Elsevier Interactive Patient Education ©2016 [employer] ° °

## 2014-12-24 NOTE — Progress Notes (Signed)
Subjective:  Ellen Camacho is a 29 y.o. [phone] at 9137w6d being seen today for ongoing prenatal care.  Patient reports occasional contractions.  Contractions: Irregular.  Vag. Bleeding: None. Movement: Present. Denies leaking of fluid.   The following portions of the patient's history were reviewed and updated as appropriate: allergies, current medications, past family history, past medical history, past social history, past surgical history and problem list. Problem list updated.  Objective:   Filed Vitals:   12/24/14 1204  BP: 115/67  Pulse: 77  Temp: 98.3 F (36.8 C)  Weight: 152 lb 9.6 oz (69.219 kg)    Fetal Status: Fetal Heart Rate (bpm): 147/138   Movement: Present     General:  Alert, oriented and cooperative. Patient is in no acute distress.  Skin: Skin is warm and dry. No rash noted.   Cardiovascular: Normal heart rate noted  Respiratory: Normal respiratory effort, no problems with respiration noted  Abdomen: Soft, gravid, appropriate for gestational age. Pain/Pressure: Present     Pelvic: Vag. Bleeding: None Vag D/C Character: White   Cervical exam deferred        Extremities: Normal range of motion.  Edema: None  Mental Status: Normal mood and affect. Normal behavior. Normal judgment and thought content.   Urinalysis: Urine Protein: Negative Urine Glucose: Negative  Assessment and Plan:  Pregnancy: [id number] at 5837w6d  1. Dichorionic diamniotic twin pregnancy, antepartum Concordant growth  Preterm labor symptoms and general obstetric precautions including but not limited to vaginal bleeding, contractions, leaking of fluid and fetal movement were reviewed in detail with the patient. Please refer to After Visit Summary for other counseling recommendations.  Return in about 1 week (around 12/31/2014).   Adam PhenixJames G Arnold, MD

## 2014-12-24 NOTE — Progress Notes (Signed)
Hasn't started flagyl or acyclovir because " not good with pills". Declines flu or tdap shot. Completed Medicaid home form.

## 2014-12-31 ENCOUNTER — Ambulatory Visit (INDEPENDENT_AMBULATORY_CARE_PROVIDER_SITE_OTHER): Payer: Medicaid Other | Admitting: Family Medicine

## 2014-12-31 VITALS — BP 95/74 | HR 94 | Temp 98.2°F | Wt 161.9 lb

## 2014-12-31 DIAGNOSIS — O0993 Supervision of high risk pregnancy, unspecified, third trimester: Secondary | ICD-10-CM

## 2014-12-31 DIAGNOSIS — O30049 Twin pregnancy, dichorionic/diamniotic, unspecified trimester: Secondary | ICD-10-CM | POA: Diagnosis present

## 2014-12-31 LAB — POCT URINALYSIS DIP (DEVICE)
BILIRUBIN URINE: NEGATIVE
GLUCOSE, UA: NEGATIVE mg/dL
Hgb urine dipstick: NEGATIVE
KETONES UR: NEGATIVE mg/dL
LEUKOCYTES UA: NEGATIVE
Nitrite: NEGATIVE
Protein, ur: NEGATIVE mg/dL
SPECIFIC GRAVITY, URINE: 1.02 (ref 1.005–1.030)
Urobilinogen, UA: 0.2 mg/dL (ref 0.0–1.0)
pH: 7 (ref 5.0–8.0)

## 2014-12-31 NOTE — Patient Instructions (Signed)

## 2014-12-31 NOTE — Progress Notes (Signed)
Subjective:  Ellen Camacho is a 29 y.o. [phone] at 5619w6d being seen today for ongoing prenatal care.  Patient reports occasional contractions.  Contractions: Irregular.  Vag. Bleeding: None. Movement: Present. Denies leaking of fluid.   The following portions of the patient's history were reviewed and updated as appropriate: allergies, current medications, past family history, past medical history, past social history, past surgical history and problem list. Problem list updated.  Objective:   Filed Vitals:   12/31/14 1115  BP: 95/74  Pulse: 94  Temp: 98.2 F (36.8 C)  Weight: 161 lb 14.4 oz (73.437 kg)    Fetal Status: Fetal Heart Rate (bpm): 148/156   Movement: Present     General:  Alert, oriented and cooperative. Patient is in no acute distress.  Skin: Skin is warm and dry. No rash noted.   Cardiovascular: Normal heart rate noted  Respiratory: Normal respiratory effort, no problems with respiration noted  Abdomen: Soft, gravid, appropriate for gestational age. Pain/Pressure: Present     Pelvic: Vag. Bleeding: None     Cervical exam deferred        Extremities: Normal range of motion.  Edema: Trace  Mental Status: Normal mood and affect. Normal behavior. Normal judgment and thought content.   Urinalysis:      Assessment and Plan:  Pregnancy: [id number] at 2319w6d  1. Supervision of high risk pregnancy, antepartum, third trimester FHT normal  2. Dichorionic diamniotic twin pregnancy, antepartum Concordant at 14%.  Breech/breech  Preterm labor symptoms and general obstetric precautions including but not limited to vaginal bleeding, contractions, leaking of fluid and fetal movement were reviewed in detail with the patient. Please refer to After Visit Summary for other counseling recommendations.  No Follow-up on file.   Levie HeritageJacob J Stinson, DO

## 2015-01-12 ENCOUNTER — Ambulatory Visit (HOSPITAL_COMMUNITY)
Admission: RE | Admit: 2015-01-12 | Discharge: 2015-01-12 | Disposition: A | Discharge status: Home / Self Care | Payer: Medicaid Other | Source: Ambulatory Visit | Attending: Obstetrics and Gynecology | Admitting: Obstetrics and Gynecology

## 2015-01-12 ENCOUNTER — Encounter (HOSPITAL_COMMUNITY): Payer: Self-pay

## 2015-01-12 ENCOUNTER — Other Ambulatory Visit (HOSPITAL_COMMUNITY): Payer: Self-pay | Admitting: Maternal and Fetal Medicine

## 2015-01-12 DIAGNOSIS — O365931 Maternal care for other known or suspected poor fetal growth, third trimester, fetus 1: Secondary | ICD-10-CM | POA: Diagnosis not present

## 2015-01-12 DIAGNOSIS — O365930 Maternal care for other known or suspected poor fetal growth, third trimester, not applicable or unspecified: Secondary | ICD-10-CM

## 2015-01-12 DIAGNOSIS — O30049 Twin pregnancy, dichorionic/diamniotic, unspecified trimester: Secondary | ICD-10-CM

## 2015-01-12 DIAGNOSIS — O0930 Supervision of pregnancy with insufficient antenatal care, unspecified trimester: Secondary | ICD-10-CM | POA: Insufficient documentation

## 2015-01-12 DIAGNOSIS — O30042 Twin pregnancy, dichorionic/diamniotic, second trimester: Secondary | ICD-10-CM | POA: Diagnosis present

## 2015-01-12 DIAGNOSIS — Z3A31 31 weeks gestation of pregnancy: Secondary | ICD-10-CM | POA: Diagnosis not present

## 2015-01-13 ENCOUNTER — Encounter: Payer: Self-pay | Admitting: *Deleted

## 2015-01-14 ENCOUNTER — Encounter (HOSPITAL_COMMUNITY): Payer: Self-pay | Admitting: *Deleted

## 2015-01-14 ENCOUNTER — Ambulatory Visit (INDEPENDENT_AMBULATORY_CARE_PROVIDER_SITE_OTHER): Payer: Medicaid Other | Admitting: Advanced Practice Midwife

## 2015-01-14 ENCOUNTER — Inpatient Hospital Stay (HOSPITAL_COMMUNITY)
Admission: AD | Admit: 2015-01-14 | Discharge: 2015-01-14 | Disposition: A | Discharge status: Home / Self Care | Payer: Medicaid Other | Source: Ambulatory Visit | Attending: Obstetrics and Gynecology | Admitting: Obstetrics and Gynecology

## 2015-01-14 VITALS — BP 124/83 | HR 87 | Temp 98.1°F | Wt 160.0 lb

## 2015-01-14 DIAGNOSIS — O30043 Twin pregnancy, dichorionic/diamniotic, third trimester: Secondary | ICD-10-CM | POA: Insufficient documentation

## 2015-01-14 DIAGNOSIS — O4703 False labor before 37 completed weeks of gestation, third trimester: Secondary | ICD-10-CM | POA: Diagnosis not present

## 2015-01-14 DIAGNOSIS — O0993 Supervision of high risk pregnancy, unspecified, third trimester: Secondary | ICD-10-CM

## 2015-01-14 DIAGNOSIS — Z3A31 31 weeks gestation of pregnancy: Secondary | ICD-10-CM | POA: Insufficient documentation

## 2015-01-14 DIAGNOSIS — O30049 Twin pregnancy, dichorionic/diamniotic, unspecified trimester: Secondary | ICD-10-CM

## 2015-01-14 HISTORY — DX: Herpesviral infection, unspecified: B00.9

## 2015-01-14 LAB — POCT URINALYSIS DIP (DEVICE)
Bilirubin Urine: NEGATIVE
GLUCOSE, UA: NEGATIVE mg/dL
HGB URINE DIPSTICK: NEGATIVE
KETONES UR: NEGATIVE mg/dL
Leukocytes, UA: NEGATIVE
Nitrite: NEGATIVE
PROTEIN: NEGATIVE mg/dL
SPECIFIC GRAVITY, URINE: 1.02 (ref 1.005–1.030)
UROBILINOGEN UA: 0.2 mg/dL (ref 0.0–1.0)
pH: 7 (ref 5.0–8.0)

## 2015-01-14 LAB — FETAL FIBRONECTIN: Fetal Fibronectin: NEGATIVE

## 2015-01-14 MED ORDER — LACTATED RINGERS IV SOLN
INTRAVENOUS | Status: DC
  Administered 2015-01-14: 14:00:00 via INTRAVENOUS

## 2015-01-14 MED ORDER — NIFEDIPINE 10 MG PO CAPS
10.0000 mg | ORAL_CAPSULE | ORAL | Status: DC | PRN
  Administered 2015-01-14 (×2): 10 mg via ORAL
  Filled 2015-01-14 (×2): qty 1

## 2015-01-14 MED ORDER — LACTATED RINGERS IV BOLUS (SEPSIS)
1000.0000 mL | Freq: Once | INTRAVENOUS | Status: AC
  Administered 2015-01-14: 1000 mL via INTRAVENOUS

## 2015-01-14 MED ORDER — NIFEDIPINE 10 MG PO CAPS
10.0000 mg | ORAL_CAPSULE | Freq: Four times a day (QID) | ORAL | Status: DC | PRN
Start: 2015-01-14 — End: 2015-02-14

## 2015-01-14 NOTE — Patient Instructions (Signed)

## 2015-01-14 NOTE — MAU Note (Signed)
Not in lobby, "went to get the car"

## 2015-01-14 NOTE — MAU Provider Note (Signed)
Chief Complaint:  Labor Eval   First Provider Initiated Contact with Patient 01/14/15 1249     HPI: Ellen Camacho is a 29 y.o. [id number] at 43100w6d who presents to maternity admissions reporting worsening contractions. Has di/di twins w/ AC lab of twin A.  UA negtive in clinic this morning.   Location: low abd Quality: cramping Severity: 6/10 in pain scale Duration: 1 month, but worsening over past few days.  Context: Worse w/ activity Timing: intermittent Modifying factors: Improves w/ rest Associated signs and symptoms: Neg for LOF, VB, vaginal discharge. Good fetal movement.   Past Medical History: Past Medical History  Diagnosis Date  . Anemia   . STD (female)   . Headache   . Seasonal allergies   . HSV infection     Past obstetric history: OB History  Gravida Para Term Preterm AB SAB TAB Ectopic Multiple Living  5 3 3  0 1 1 0 0 0 3    # Outcome Date GA Lbr Len/2nd Weight Sex Delivery Anes PTL Lv  5 Current           4 SAB           3 Term           2 Term           1 Term               Past Surgical History: Past Surgical History  Procedure Laterality Date  . No past surgeries       Family History: Family History  Problem Relation Age of Onset  . Cancer Mother   . Diabetes Mother   . Hypertension Mother   . Heart disease Mother   . Stroke Brother   . Cancer Maternal Grandfather     Social History: Social History  Substance Use Topics  . Smoking status: Current Some Day Smoker -- 0.50 packs/day    Types: Cigarettes    Last Attempt to Quit: 07/21/2014  . Smokeless tobacco: Never Used  . Alcohol Use: No    Allergies: No Known Allergies  Meds:  Prescriptions prior to admission  Medication Sig Dispense Refill Last Dose  . acyclovir (ZOVIRAX) 400 MG tablet Take 1 tablet (400 mg total) by mouth 3 (three) times daily. 90 tablet 2 Past Week at Unknown time  . Prenat w/o A Vit-FeFum-FePo-FA (CONCEPT OB) 130-92.4-1 MG CAPS Take 1 tablet by mouth daily.  30 capsule 12 Past Week at Unknown time  . aspirin EC 81 MG tablet Take 1 tablet (81 mg total) by mouth daily. Take after 12 weeks for prevention of preeclampsia later in pregnancy (Patient not taking: Reported on 01/14/2015) 300 tablet 2 Not Taking  . metroNIDAZOLE (FLAGYL) 500 MG tablet Take 1 tablet (500 mg total) by mouth 2 (two) times daily. (Patient not taking: Reported on 11/12/2014) 14 tablet 0 Completed Course at Unknown time  . nicotine (NICODERM CQ - DOSED IN MG/24 HOURS) 14 mg/24hr patch Place 1 patch (14 mg total) onto the skin daily. (Patient not taking: Reported on 01/12/2015) 28 patch 3 Not Taking at Unknown time  . nicotine polacrilex (RA NICOTINE) 2 MG gum Take 1 each (2 mg total) by mouth as needed for smoking cessation. (Patient not taking: Reported on 01/12/2015) 100 tablet 3 Not Taking at Unknown time    I have reviewed patient's Past Medical Hx, Surgical Hx, Family Hx, Social Hx, medications and allergies.   ROS:  Review of Systems  Constitutional: Negative  for fever and chills.  Gastrointestinal: Positive for abdominal pain (contractions only).  Genitourinary: Positive for pelvic pain. Negative for dysuria, urgency, frequency, hematuria, flank pain, vaginal bleeding and vaginal discharge.  Musculoskeletal: Negative for back pain.    Physical Exam  Patient Vitals for the past 24 hrs:  BP Temp Temp src Pulse Resp SpO2  01/14/15 1610 114/68 mmHg 98.1 F (36.7 C) Oral 90 18 -  01/14/15 1518 118/65 mmHg - - - - -  01/14/15 1241 113/73 mmHg 98 F (36.7 C) Oral 91 18 98 %   Constitutional: Well-developed, well-nourished female in mild distress.  Cardiovascular: normal rate Respiratory: normal effort GI: Abd soft, non-tender, gravid S>D Neurologic: Alert and oriented x 4.  GU: Neg CVAT.  Pelvic: NEFG, physiologic discharge, no blood.  Dilation: Fingertip Effacement (%): Thick Exam by:: Ivonne Andrew, CNM  FHT:  Baseline 140's x 2 , moderate variability, accelerations  present, no decelerations. Intermittent tracing due to very active babies.  Contractions: q 3-10 mins, mild   Labs: Results for orders placed or performed during the hospital encounter of 01/14/15 (from the past 24 hour(s))  Fetal fibronectin     Status: None   Collection Time: 01/14/15 12:50 PM  Result Value Ref Range   Fetal Fibronectin NEGATIVE NEGATIVE    Imaging:  NA  MAU Course: IV fluid bolus, fFN.  fFN, but contractions closer and stronger. Procardia ordered.  Contractions improved. Cervix unchanged.   MDM: 31.6 w/ preterm contractions, but no cervical change and fFN neg. No evidence of active preterm labor, but increased risk to to twin gestation. Will D/C home w/ Procardia PRN.   Assessment: 1. Preterm contractions, third trimester   2. Dichorionic diamniotic twin pregnancy in third trimester     Plan: Discharge home in stable condition per consult w/ Dr. Jolayne Panther.  Preterm labor precautions and fetal kick counts Rx Procardia. Follow-up Information    Follow up with [facility] On 01/19/2015.   Specialty:  Maternal and Fetal Medicine   Why:  As scheduled for testing.   Contact information:   [address] [id number] mc [place] [address] [phone]      Follow up with [facility] In 2 weeks.   Specialty:  Obstetrics and Gynecology   Why:  will call you to scheduled appointment   Contact information:   [address] [place] [address] [phone]      Follow up with [facility] [place] [facility].   Why:  As needed if symptoms worsen   Contact information:   [address] [id number] mc [place] [address] [phone]        Medication List    STOP taking these medications        aspirin EC 81 MG tablet     metroNIDAZOLE 500 MG tablet  Commonly known as:  FLAGYL     nicotine 14 mg/24hr patch  Commonly known as:   NICODERM CQ - dosed in mg/24 hours     nicotine polacrilex 2 MG gum  Commonly known as:  RA NICOTINE      TAKE these medications        acyclovir 400 MG tablet  Commonly known as:  ZOVIRAX  Take 1 tablet (400 mg total) by mouth 3 (three) times daily.     CONCEPT OB 130-92.4-1 MG Caps  Take 1 tablet by mouth daily.     NIFEdipine 10 MG capsule  Commonly known  as:  PROCARDIA  Take 1 capsule (10 mg total) by mouth every 6 (six) hours as needed (for greater than 5 contractions per hour).        [place], CNM 01/14/2015 4:28 PM

## 2015-01-14 NOTE — Discharge Instructions (Signed)

## 2015-01-14 NOTE — Progress Notes (Signed)
Breastfeeding tip of the week reviewed. 

## 2015-01-14 NOTE — Progress Notes (Signed)
Chart reviewed for plan of care regarding fetal testing.  MFM has recommended weekly BPP w/UA doppler. Pt has these appts scheduled already as well as next growth US. Consult w/Dr. Adrian BlackwaterStinson and pt does not need additional fetal testing regimen at this time. Next prenatal care appt in 2 weeks

## 2015-01-14 NOTE — Progress Notes (Signed)
Subjective:  Ellen Camacho is a 29 y.o. [phone] at 7013w6d being seen today for ongoing prenatal care.  Patient reports worsening contractions.  Contractions: Irregular.  Vag. Bleeding: None. Movement: Present. Denies leaking of fluid, VB, urinary or GI complaints.   The following portions of the patient's history were reviewed and updated as appropriate: allergies, current medications, past family history, past medical history, past social history, past surgical history and problem list. Problem list updated.  Objective:   Filed Vitals:   01/14/15 1153  BP: 124/83  Pulse: 87  Temp: 98.1 F (36.7 C)  Weight: 160 lb (72.576 kg)    Fetal Status: Fetal Heart Rate (bpm): 144/145   Movement: Present     General:  Alert, oriented and cooperative. Patient is in mild distress.  Skin: Skin is warm and dry. No rash noted.   Cardiovascular: Normal heart rate noted  Respiratory: Normal respiratory effort, no problems with respiration noted  Abdomen: Soft, gravid, appropriate for gestational age. Pain/Pressure: Present     Pelvic: Vag. Bleeding: None     Cervical exam deferred        Extremities: Normal range of motion.  Edema: None  Mental Status: Normal mood and affect. Normal behavior. Normal judgment and thought content.   Urinalysis: Urine Protein: Negative Urine Glucose: Negative  Assessment and Plan:  Pregnancy: [id number] at 8213w6d  There are no diagnoses linked to this encounter. Preterm labor symptoms and general obstetric precautions including but not limited to vaginal bleeding, contractions, leaking of fluid and fetal movement were reviewed in detail with the patient. Please refer to After Visit Summary for other counseling recommendations.  To MAU for PTL eval. Return for Start antenatal testing.   Dorathy KinsmanVirginia Burnis Kaser, CNM

## 2015-01-14 NOTE — MAU Note (Signed)
Pt states babies have "been balling up" for over a month now.  She was seen in clinic today and sent for evaluation.  Denies vaginal bleeding or abnormal discharge or ROM.  Good fetal movement.

## 2015-01-19 ENCOUNTER — Ambulatory Visit (HOSPITAL_COMMUNITY)
Admission: RE | Admit: 2015-01-19 | Discharge: 2015-01-19 | Disposition: A | Discharge status: Home / Self Care | Payer: Medicaid Other | Source: Ambulatory Visit | Attending: Obstetrics and Gynecology | Admitting: Obstetrics and Gynecology

## 2015-01-19 ENCOUNTER — Encounter (HOSPITAL_COMMUNITY): Payer: Self-pay

## 2015-01-19 DIAGNOSIS — O365931 Maternal care for other known or suspected poor fetal growth, third trimester, fetus 1: Secondary | ICD-10-CM | POA: Diagnosis not present

## 2015-01-19 DIAGNOSIS — O365930 Maternal care for other known or suspected poor fetal growth, third trimester, not applicable or unspecified: Secondary | ICD-10-CM

## 2015-01-19 DIAGNOSIS — Z3A32 32 weeks gestation of pregnancy: Secondary | ICD-10-CM | POA: Diagnosis not present

## 2015-01-19 DIAGNOSIS — O0930 Supervision of pregnancy with insufficient antenatal care, unspecified trimester: Secondary | ICD-10-CM | POA: Diagnosis not present

## 2015-01-19 DIAGNOSIS — O30042 Twin pregnancy, dichorionic/diamniotic, second trimester: Secondary | ICD-10-CM | POA: Diagnosis not present

## 2015-01-21 ENCOUNTER — Encounter: Payer: Medicaid Other | Admitting: Obstetrics & Gynecology

## 2015-01-26 ENCOUNTER — Ambulatory Visit (HOSPITAL_COMMUNITY): Payer: Medicaid Other

## 2015-01-27 ENCOUNTER — Ambulatory Visit (HOSPITAL_COMMUNITY)
Admission: RE | Admit: 2015-01-27 | Discharge: 2015-01-27 | Disposition: A | Discharge status: Home / Self Care | Payer: Medicaid Other | Source: Ambulatory Visit | Attending: Maternal and Fetal Medicine | Admitting: Maternal and Fetal Medicine

## 2015-01-27 ENCOUNTER — Encounter (HOSPITAL_COMMUNITY): Payer: Self-pay

## 2015-01-27 VITALS — BP 121/81 | HR 97 | Wt 160.2 lb

## 2015-01-27 DIAGNOSIS — O365930 Maternal care for other known or suspected poor fetal growth, third trimester, not applicable or unspecified: Secondary | ICD-10-CM

## 2015-01-27 DIAGNOSIS — Z3A33 33 weeks gestation of pregnancy: Secondary | ICD-10-CM | POA: Diagnosis not present

## 2015-01-27 DIAGNOSIS — O365931 Maternal care for other known or suspected poor fetal growth, third trimester, fetus 1: Secondary | ICD-10-CM | POA: Diagnosis not present

## 2015-01-27 DIAGNOSIS — O30043 Twin pregnancy, dichorionic/diamniotic, third trimester: Secondary | ICD-10-CM | POA: Insufficient documentation

## 2015-01-27 DIAGNOSIS — O30049 Twin pregnancy, dichorionic/diamniotic, unspecified trimester: Secondary | ICD-10-CM

## 2015-02-01 ENCOUNTER — Encounter: Payer: Medicaid Other | Admitting: Obstetrics and Gynecology

## 2015-02-02 ENCOUNTER — Other Ambulatory Visit (HOSPITAL_COMMUNITY): Payer: Self-pay | Admitting: Maternal and Fetal Medicine

## 2015-02-02 ENCOUNTER — Encounter (HOSPITAL_COMMUNITY): Payer: Self-pay

## 2015-02-02 ENCOUNTER — Ambulatory Visit (HOSPITAL_COMMUNITY)
Admission: RE | Admit: 2015-02-02 | Discharge: 2015-02-02 | Disposition: A | Discharge status: Home / Self Care | Payer: Medicaid Other | Source: Ambulatory Visit | Attending: Obstetrics and Gynecology | Admitting: Obstetrics and Gynecology

## 2015-02-02 DIAGNOSIS — O30043 Twin pregnancy, dichorionic/diamniotic, third trimester: Secondary | ICD-10-CM

## 2015-02-02 DIAGNOSIS — Z3A34 34 weeks gestation of pregnancy: Secondary | ICD-10-CM

## 2015-02-02 DIAGNOSIS — O365930 Maternal care for other known or suspected poor fetal growth, third trimester, not applicable or unspecified: Secondary | ICD-10-CM

## 2015-02-02 DIAGNOSIS — O365931 Maternal care for other known or suspected poor fetal growth, third trimester, fetus 1: Secondary | ICD-10-CM | POA: Diagnosis not present

## 2015-02-10 ENCOUNTER — Other Ambulatory Visit (HOSPITAL_COMMUNITY)
Admission: RE | Admit: 2015-02-10 | Discharge: 2015-02-10 | Disposition: A | Discharge status: Home / Self Care | Payer: Medicaid Other | Source: Ambulatory Visit | Attending: Family | Admitting: Family

## 2015-02-10 ENCOUNTER — Ambulatory Visit (HOSPITAL_COMMUNITY)
Admission: RE | Admit: 2015-02-10 | Discharge: 2015-02-10 | Disposition: A | Discharge status: Home / Self Care | Payer: Medicaid Other | Source: Ambulatory Visit | Attending: Obstetrics and Gynecology | Admitting: Obstetrics and Gynecology

## 2015-02-10 ENCOUNTER — Encounter (HOSPITAL_COMMUNITY): Payer: Self-pay

## 2015-02-10 ENCOUNTER — Ambulatory Visit (INDEPENDENT_AMBULATORY_CARE_PROVIDER_SITE_OTHER): Payer: Medicaid Other | Admitting: Family

## 2015-02-10 VITALS — BP 133/92 | HR 90 | Wt 169.3 lb

## 2015-02-10 DIAGNOSIS — Z3A35 35 weeks gestation of pregnancy: Secondary | ICD-10-CM | POA: Diagnosis not present

## 2015-02-10 DIAGNOSIS — O30049 Twin pregnancy, dichorionic/diamniotic, unspecified trimester: Secondary | ICD-10-CM

## 2015-02-10 DIAGNOSIS — O30043 Twin pregnancy, dichorionic/diamniotic, third trimester: Secondary | ICD-10-CM | POA: Insufficient documentation

## 2015-02-10 DIAGNOSIS — Z113 Encounter for screening for infections with a predominantly sexual mode of transmission: Secondary | ICD-10-CM | POA: Diagnosis not present

## 2015-02-10 DIAGNOSIS — O365931 Maternal care for other known or suspected poor fetal growth, third trimester, fetus 1: Secondary | ICD-10-CM | POA: Insufficient documentation

## 2015-02-10 LAB — POCT URINALYSIS DIP (DEVICE)
Bilirubin Urine: NEGATIVE
GLUCOSE, UA: NEGATIVE mg/dL
HGB URINE DIPSTICK: NEGATIVE
Ketones, ur: NEGATIVE mg/dL
Leukocytes, UA: NEGATIVE
NITRITE: NEGATIVE
PROTEIN: 30 mg/dL — AB
SPECIFIC GRAVITY, URINE: 1.025 (ref 1.005–1.030)
UROBILINOGEN UA: 0.2 mg/dL (ref 0.0–1.0)
pH: 6.5 (ref 5.0–8.0)

## 2015-02-10 NOTE — Progress Notes (Signed)
Subjective:  Ellen Camacho is a 29 y.o. [phone] at 151w5d being seen today for ongoing prenatal care.  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  Polyhydramnios with twin B with elevated doppler.    Patient reports backache.  Denies  headache, vision changes, or epigastric pain.  Contractions: Irritability. Vag. Bleeding: None.  Movement: Present. Denies leaking of fluid.   The following portions of the patient's history were reviewed and updated as appropriate: allergies, current medications, past family history, past medical history, past social history, past surgical history and problem list. Problem list updated.  Objective:   Filed Vitals:   02/10/15 1056  BP: 133/92  Pulse: 90  Weight: 169 lb 4.8 oz (76.794 kg)    Fetal Status: Fetal Heart Rate (bpm): us today   Movement: Present  Presentation: Vertex  General:  Alert, oriented and cooperative. Patient is in no acute distress.  Skin: Skin is warm and dry. No rash noted.   Cardiovascular: Normal heart rate noted  Respiratory: Normal respiratory effort, no problems with respiration noted  Abdomen: Soft, gravid, appropriate for gestational age. Pain/Pressure: Present     Pelvic: Vag. Bleeding: None Vag D/C Character: White   Cervical exam performed Dilation: 2 Effacement (%): Thick Station: -3  Extremities: Normal range of motion.  Edema: Trace  Mental Status: Normal mood and affect. Normal behavior. Normal judgment and thought content.   Urinalysis:      Assessment and Plan:  Pregnancy: [id number] at 8951w5d  1. Dichorionic diamniotic twin pregnancy, antepartum - Culture, beta strep (group b only) -  [facility]lam[facility]  2. Polyhydramnios - Twin B; Elevated Doppler - Twin A BPP 8/8, normal dopplers; Twin B BPP 8/8, elevated dopplers (MFM recommended IOL at 36 wks - see ultrasound report 02/10/15) - Consulted with Dr. Debroah LoopArnold > reviewed OB history and ultrasound report from today (02/10/15), schedule IOL for 36 weeks  3.  Elevated Blood Pressure -IOL already scheduled for 02/12/15 (36 weeks)  Preterm labor symptoms and general obstetric precautions including but not limited to vaginal bleeding, contractions, leaking of fluid and fetal movement were reviewed in detail with the patient. Please refer to After Visit Summary for other counseling recommendations.    Ellen Camacho, CNM

## 2015-02-11 LAB — GC/CHLAMYDIA PROBE AMP (~~LOC~~) NOT AT ARMC
Chlamydia: NEGATIVE
Neisseria Gonorrhea: NEGATIVE

## 2015-02-12 ENCOUNTER — Inpatient Hospital Stay (HOSPITAL_COMMUNITY)
Admission: RE | Admit: 2015-02-12 | Discharge: 2015-02-14 | DRG: 775 | Disposition: A | Discharge status: Home / Self Care | Payer: Medicaid Other | Source: Ambulatory Visit | Attending: Family Medicine | Admitting: Family Medicine

## 2015-02-12 ENCOUNTER — Inpatient Hospital Stay (HOSPITAL_COMMUNITY): Payer: Medicaid Other | Admitting: Anesthesiology

## 2015-02-12 ENCOUNTER — Encounter (HOSPITAL_COMMUNITY): Payer: Self-pay

## 2015-02-12 DIAGNOSIS — O30043 Twin pregnancy, dichorionic/diamniotic, third trimester: Secondary | ICD-10-CM | POA: Diagnosis present

## 2015-02-12 DIAGNOSIS — Z833 Family history of diabetes mellitus: Secondary | ICD-10-CM | POA: Diagnosis not present

## 2015-02-12 DIAGNOSIS — O99334 Smoking (tobacco) complicating childbirth: Secondary | ICD-10-CM | POA: Diagnosis present

## 2015-02-12 DIAGNOSIS — Z3A36 36 weeks gestation of pregnancy: Secondary | ICD-10-CM

## 2015-02-12 DIAGNOSIS — F17210 Nicotine dependence, cigarettes, uncomplicated: Secondary | ICD-10-CM | POA: Diagnosis present

## 2015-02-12 DIAGNOSIS — Z823 Family history of stroke: Secondary | ICD-10-CM | POA: Diagnosis not present

## 2015-02-12 DIAGNOSIS — O403XX2 Polyhydramnios, third trimester, fetus 2: Secondary | ICD-10-CM | POA: Diagnosis present

## 2015-02-12 DIAGNOSIS — O99332 Smoking (tobacco) complicating pregnancy, second trimester: Secondary | ICD-10-CM

## 2015-02-12 DIAGNOSIS — O99320 Drug use complicating pregnancy, unspecified trimester: Secondary | ICD-10-CM

## 2015-02-12 DIAGNOSIS — O26893 Other specified pregnancy related conditions, third trimester: Secondary | ICD-10-CM | POA: Diagnosis present

## 2015-02-12 DIAGNOSIS — O0993 Supervision of high risk pregnancy, unspecified, third trimester: Secondary | ICD-10-CM

## 2015-02-12 DIAGNOSIS — O0930 Supervision of pregnancy with insufficient antenatal care, unspecified trimester: Secondary | ICD-10-CM

## 2015-02-12 DIAGNOSIS — O409XX0 Polyhydramnios, unspecified trimester, not applicable or unspecified: Secondary | ICD-10-CM | POA: Diagnosis present

## 2015-02-12 DIAGNOSIS — Z8249 Family history of ischemic heart disease and other diseases of the circulatory system: Secondary | ICD-10-CM

## 2015-02-12 DIAGNOSIS — O365931 Maternal care for other known or suspected poor fetal growth, third trimester, fetus 1: Principal | ICD-10-CM | POA: Diagnosis present

## 2015-02-12 DIAGNOSIS — O30049 Twin pregnancy, dichorionic/diamniotic, unspecified trimester: Secondary | ICD-10-CM

## 2015-02-12 LAB — GROUP B STREP BY PCR: GROUP B STREP BY PCR: NEGATIVE

## 2015-02-12 LAB — CBC
HCT: 30.4 % — ABNORMAL LOW (ref 36.0–46.0)
Hemoglobin: 9.9 g/dL — ABNORMAL LOW (ref 12.0–15.0)
MCH: 27.6 pg (ref 26.0–34.0)
MCHC: 32.6 g/dL (ref 30.0–36.0)
MCV: 84.7 fL (ref 78.0–100.0)
PLATELETS: 274 10*3/uL (ref 150–400)
RBC: 3.59 MIL/uL — AB (ref 3.87–5.11)
RDW: 14.8 % (ref 11.5–15.5)
WBC: 12.8 10*3/uL — AB (ref 4.0–10.5)

## 2015-02-12 LAB — OB RESULTS CONSOLE GBS: GBS: NEGATIVE

## 2015-02-12 LAB — RPR: RPR: NONREACTIVE

## 2015-02-12 LAB — CULTURE, BETA STREP (GROUP B ONLY)

## 2015-02-12 MED ORDER — CITRIC ACID-SODIUM CITRATE 334-500 MG/5ML PO SOLN: 30.0000 mL | ORAL | Status: DC | PRN

## 2015-02-12 MED ORDER — OXYTOCIN 40 UNITS IN LACTATED RINGERS INFUSION - SIMPLE MED: 62.5000 mL/h | INTRAVENOUS | Status: DC

## 2015-02-12 MED ORDER — DIBUCAINE 1 % RE OINT: 1.0000 "application " | TOPICAL_OINTMENT | RECTAL | Status: DC | PRN

## 2015-02-12 MED ORDER — LANOLIN HYDROUS EX OINT: TOPICAL_OINTMENT | CUTANEOUS | Status: DC | PRN

## 2015-02-12 MED ORDER — OXYCODONE-ACETAMINOPHEN 5-325 MG PO TABS
2.0000 | ORAL_TABLET | ORAL | Status: DC | PRN
  Administered 2015-02-13 – 2015-02-14 (×2): 2 via ORAL
  Filled 2015-02-12 (×2): qty 2

## 2015-02-12 MED ORDER — DIPHENHYDRAMINE HCL 25 MG PO CAPS: 25.0000 mg | ORAL_CAPSULE | Freq: Four times a day (QID) | ORAL | Status: DC | PRN

## 2015-02-12 MED ORDER — WITCH HAZEL-GLYCERIN EX PADS: 1.0000 "application " | MEDICATED_PAD | CUTANEOUS | Status: DC | PRN

## 2015-02-12 MED ORDER — OXYCODONE-ACETAMINOPHEN 5-325 MG PO TABS: 1.0000 | ORAL_TABLET | ORAL | Status: DC | PRN

## 2015-02-12 MED ORDER — ACETAMINOPHEN 325 MG PO TABS
650.0000 mg | ORAL_TABLET | ORAL | Status: DC | PRN
Start: 2015-02-12 — End: 2015-02-14

## 2015-02-12 MED ORDER — FENTANYL 2.5 MCG/ML BUPIVACAINE 1/10 % EPIDURAL INFUSION (WH - ANES)
14.0000 mL/h | INTRAMUSCULAR | Status: DC | PRN
  Administered 2015-02-12 (×2): 14 mL/h via EPIDURAL
  Filled 2015-02-12: qty 125

## 2015-02-12 MED ORDER — OXYCODONE-ACETAMINOPHEN 5-325 MG PO TABS
1.0000 | ORAL_TABLET | ORAL | Status: DC | PRN
  Administered 2015-02-13 – 2015-02-14 (×4): 1 via ORAL
  Filled 2015-02-12 (×4): qty 1

## 2015-02-12 MED ORDER — ONDANSETRON HCL 4 MG PO TABS: 4.0000 mg | ORAL_TABLET | ORAL | Status: DC | PRN

## 2015-02-12 MED ORDER — FLEET ENEMA 7-19 GM/118ML RE ENEM: 1.0000 | ENEMA | RECTAL | Status: DC | PRN

## 2015-02-12 MED ORDER — BENZOCAINE-MENTHOL 20-0.5 % EX AERO
1.0000 "application " | INHALATION_SPRAY | CUTANEOUS | Status: DC | PRN
  Administered 2015-02-12: 1 via TOPICAL
  Filled 2015-02-12: qty 56

## 2015-02-12 MED ORDER — OXYCODONE-ACETAMINOPHEN 5-325 MG PO TABS: 2.0000 | ORAL_TABLET | ORAL | Status: DC | PRN

## 2015-02-12 MED ORDER — ACETAMINOPHEN 325 MG PO TABS: 650.0000 mg | ORAL_TABLET | ORAL | Status: DC | PRN

## 2015-02-12 MED ORDER — ZOLPIDEM TARTRATE 5 MG PO TABS: 5.0000 mg | ORAL_TABLET | Freq: Every evening | ORAL | Status: DC | PRN

## 2015-02-12 MED ORDER — LIDOCAINE HCL (PF) 1 % IJ SOLN
30.0000 mL | INTRAMUSCULAR | Status: DC | PRN
  Filled 2015-02-12: qty 30

## 2015-02-12 MED ORDER — PHENYLEPHRINE 40 MCG/ML (10ML) SYRINGE FOR IV PUSH (FOR BLOOD PRESSURE SUPPORT)
80.0000 ug | PREFILLED_SYRINGE | INTRAVENOUS | Status: DC | PRN
Start: 2015-02-12 — End: 2015-02-12
  Filled 2015-02-12: qty 20
  Filled 2015-02-12: qty 2

## 2015-02-12 MED ORDER — ONDANSETRON HCL 4 MG/2ML IJ SOLN: 4.0000 mg | Freq: Four times a day (QID) | INTRAMUSCULAR | Status: DC | PRN

## 2015-02-12 MED ORDER — SENNOSIDES-DOCUSATE SODIUM 8.6-50 MG PO TABS
2.0000 | ORAL_TABLET | ORAL | Status: DC
  Administered 2015-02-12 – 2015-02-14 (×2): 2 via ORAL
  Filled 2015-02-12 (×2): qty 2

## 2015-02-12 MED ORDER — OXYTOCIN BOLUS FROM INFUSION: 500.0000 mL | INTRAVENOUS | Status: DC

## 2015-02-12 MED ORDER — DIPHENHYDRAMINE HCL 50 MG/ML IJ SOLN: 12.5000 mg | INTRAMUSCULAR | Status: DC | PRN

## 2015-02-12 MED ORDER — IBUPROFEN 600 MG PO TABS
600.0000 mg | ORAL_TABLET | Freq: Four times a day (QID) | ORAL | Status: DC
  Administered 2015-02-12 – 2015-02-14 (×8): 600 mg via ORAL
  Filled 2015-02-12 (×8): qty 1

## 2015-02-12 MED ORDER — TETANUS-DIPHTH-ACELL PERTUSSIS 5-2.5-18.5 LF-MCG/0.5 IM SUSP
0.5000 mL | Freq: Once | INTRAMUSCULAR | Status: AC
  Administered 2015-02-14: 0.5 mL via INTRAMUSCULAR
  Filled 2015-02-12: qty 0.5

## 2015-02-12 MED ORDER — EPHEDRINE 5 MG/ML INJ
10.0000 mg | INTRAVENOUS | Status: DC | PRN
Start: 2015-02-12 — End: 2015-02-12
  Filled 2015-02-12: qty 2

## 2015-02-12 MED ORDER — PRENATAL MULTIVITAMIN CH
1.0000 | ORAL_TABLET | Freq: Every day | ORAL | Status: DC
  Administered 2015-02-13 – 2015-02-14 (×2): 1 via ORAL
  Filled 2015-02-12 (×2): qty 1

## 2015-02-12 MED ORDER — LIDOCAINE HCL (PF) 1 % IJ SOLN
INTRAMUSCULAR | Status: DC | PRN
  Administered 2015-02-12 (×2): 4 mL via EPIDURAL

## 2015-02-12 MED ORDER — LACTATED RINGERS IV SOLN
INTRAVENOUS | Status: DC
  Administered 2015-02-12 (×3): via INTRAVENOUS

## 2015-02-12 MED ORDER — OXYTOCIN 40 UNITS IN LACTATED RINGERS INFUSION - SIMPLE MED
1.0000 m[IU]/min | INTRAVENOUS | Status: DC
  Administered 2015-02-12: 2 m[IU]/min via INTRAVENOUS
  Administered 2015-02-12: 6 m[IU]/min via INTRAVENOUS
  Administered 2015-02-12: 10 m[IU]/min via INTRAVENOUS
  Filled 2015-02-12: qty 1000

## 2015-02-12 MED ORDER — ONDANSETRON HCL 4 MG/2ML IJ SOLN: 4.0000 mg | INTRAMUSCULAR | Status: DC | PRN

## 2015-02-12 MED ORDER — FENTANYL CITRATE (PF) 100 MCG/2ML IJ SOLN
50.0000 ug | INTRAMUSCULAR | Status: DC | PRN
  Administered 2015-02-12 (×2): 50 ug via INTRAVENOUS
  Filled 2015-02-12 (×2): qty 2

## 2015-02-12 MED ORDER — SIMETHICONE 80 MG PO CHEW: 80.0000 mg | CHEWABLE_TABLET | ORAL | Status: DC | PRN

## 2015-02-12 MED ORDER — LACTATED RINGERS IV SOLN
500.0000 mL | INTRAVENOUS | Status: DC | PRN
  Administered 2015-02-12: 500 mL via INTRAVENOUS

## 2015-02-12 NOTE — Progress Notes (Signed)
Patient ID: Ellen Camacho, female   DOB: 11/27/1985, 29 y.o.   MRN: [id number] Doing well, starting to hurt more Does want epidural and wants her water broken to hasten delivery  FHR reassuring in both twins though not reactive by criteria at this time UCs irregular  Will get epidural placed before I do an amniotomy

## 2015-02-12 NOTE — Anesthesia Preprocedure Evaluation (Signed)
Anesthesia Evaluation  Patient identified by MRN, date of birth, ID band Patient awake    Reviewed: Allergy & Precautions, Patient's Chart, lab work & pertinent test results  Airway Mallampati: II  TM Distance: >3 FB Neck ROM: Full    Dental no notable dental hx. (+) Teeth Intact   Pulmonary Current Smoker,    Pulmonary exam normal breath sounds clear to auscultation       Cardiovascular negative cardio ROS Normal cardiovascular exam Rhythm:Regular Rate:Normal     Neuro/Psych  Headaches, negative psych ROS   GI/Hepatic Neg liver ROS,   Endo/Other  negative endocrine ROS  Renal/GU negative Renal ROS  negative genitourinary   Musculoskeletal negative musculoskeletal ROS (+)   Abdominal   Peds  Hematology  (+) anemia ,   Anesthesia Other Findings   Reproductive/Obstetrics 36 weeks Twin gestation - Vertex/Vertex                              Anesthesia Physical Anesthesia Plan  ASA: II  Anesthesia Plan: Epidural   Post-op Pain Management:    Induction:   Airway Management Planned: Natural Airway  Additional Equipment:   Intra-op Plan:   Post-operative Plan:   Informed Consent: I have reviewed the patients History and Physical, chart, labs and discussed the procedure including the risks, benefits and alternatives for the proposed anesthesia with the patient or authorized representative who has indicated his/her understanding and acceptance.   Dental advisory given  Plan Discussed with: Anesthesiologist, CRNA and Surgeon  Anesthesia Plan Comments:         Anesthesia Quick Evaluation

## 2015-02-12 NOTE — H&P (Signed)
Ellen Camacho is a 29 y.o. female [phone] with IUP at 1152w0d presenting for  IOL for Twin A:  Lagging growth, decreased fluid, and increased dopplers. Twin B:  Polyhydramnios  Prenatal History/Complications:  As above Late to care  Past Medical History: Past Medical History  Diagnosis Date  . Anemia   . STD (female)   . Headache   . Seasonal allergies   . HSV infection     Past Surgical History: Past Surgical History  Procedure Laterality Date  . No past surgeries      Obstetrical History: OB History    Gravida Para Term Preterm AB TAB SAB Ectopic Multiple Living   5 3 3  0 1 0 1 0 0 3      Social History: Social History   Social History  . Marital Status: Single    Spouse Name: N/A  . Number of Children: N/A  . Years of Education: N/A   Social History Main Topics  . Smoking status: Current Some Day Smoker -- 0.50 packs/day    Types: Cigarettes    Last Attempt to Quit: 07/21/2014  . Smokeless tobacco: Never Used  . Alcohol Use: No  . Drug Use: No  . Sexual Activity: Yes    Birth Control/ Protection: None   Other Topics Concern  . None   Social History Narrative    Family History: Family History  Problem Relation Age of Onset  . Cancer Mother   . Diabetes Mother   . Hypertension Mother   . Heart disease Mother   . Stroke Brother   . Cancer Maternal Grandfather     Allergies: No Known Allergies  Prescriptions prior to admission  Medication Sig Dispense Refill Last Dose  . acyclovir (ZOVIRAX) 400 MG tablet Take 1 tablet (400 mg total) by mouth 3 (three) times daily. (Patient not taking: Reported on 01/27/2015) 90 tablet 2 Not Taking  . NIFEdipine (PROCARDIA) 10 MG capsule Take 1 capsule (10 mg total) by mouth every 6 (six) hours as needed (for greater than 5 contractions per hour). (Patient not taking: Reported on 02/10/2015) 30 capsule 0 Not Taking  . Prenat w/o A Vit-FeFum-FePo-FA (CONCEPT OB) 130-92.4-1 MG CAPS Take 1 tablet by mouth daily.  (Patient not taking: Reported on 02/10/2015) 30 capsule 12 Not Taking     Prenatal Transfer Tool  Maternal Diabetes: No Genetic Screening: Declined Maternal Ultrasounds/Referrals: Abnormal:  Findings:   Other: above. Anatomy normal X 2 Fetal Ultrasounds or other Referrals:  Referred to Materal Fetal Medicine  Maternal Substance Abuse:  No Significant Maternal Medications:  None Significant Maternal Lab Results: None  EFW: Twin A 24%/Twin B 54%   Review of Systems   Constitutional: Negative for fever and chills Eyes: Negative for visual disturbances Respiratory: Negative for shortness of breath, dyspnea Cardiovascular: Negative for chest pain or palpitations  Gastrointestinal: Negative for vomiting, diarrhea and constipation.  negative for abdominal pain (contractions) Genitourinary: Negative for dysuria and urgency Musculoskeletal: Negative for back pain, joint pain, myalgias  Neurological: Negative for dizziness and headaches      Last menstrual period 05/10/2014. General appearance: alert, cooperative and no distress Lungs: clear to auscultation bilaterally Heart: regular rate and rhythm Abdomen: soft, non-tender; bowel sounds normal Pelvic: 2/50/-2 Extremities: Homans sign is negative, no sign of DVT DTR's 2_ Presentation: cephalic/cephalic Fetal monitoring  Baseline: 140 bpm, Variability: Good {> 6 bpm), Accelerations: Reactive and Decelerations: Absent Uterine activity  q 2-3  Minutes "for a month" per pt  Prenatal labs: ABO, Rh: --/--/O POS, O POS (08/20 1815) Antibody: NEG (08/20 1815) Rubella: !Error!immune RPR: NON REAC (09/15 1325)  HBsAg: Negative (08/20 1815)  HIV: NONREACTIVE (09/15 1325)  GBS:   pend 1 hr Glucola 88 Genetic screening  Too late Anatomy US normal x 2   No results found for this or any previous visit (from the past 24 hour(s)).  Assessment: Ellen Camacho is a 28 y.o. [phone] with an IUP at [redacted]w[redacted]d presenting for IOL for  lagging growth, decreased fluid and increased dopplers in Twin A.  Twin B polyhydramnios  Plan: #Labor: Foley placed #Pain:  epidural #FWB cat 1 X 2 #ID: GBS: pending  #MOF:  bottle   CRESENZO-DISHMAN,Trenity Pha 02/12/2015, 1:18 AM

## 2015-02-12 NOTE — Anesthesia Procedure Notes (Signed)
Epidural Patient location during procedure: OB Start time: 02/12/2015 10:16 AM  Staffing Anesthesiologist: Mal AmabileFOSTER, Cherron Blitzer Performed by: anesthesiologist   Preanesthetic Checklist Completed: patient identified, site marked, surgical consent, pre-op evaluation, timeout performed, IV checked, risks and benefits discussed and monitors and equipment checked  Epidural Patient position: sitting Prep: site prepped and draped and DuraPrep Patient monitoring: continuous pulse ox and blood pressure Approach: midline Location: L3-L4 Injection technique: LOR air  Needle:  Needle type: Tuohy  Needle gauge: 17 G Needle length: 9 cm and 9 Needle insertion depth: 4 cm Catheter type: closed end flexible Catheter size: 19 Gauge Catheter at skin depth: 9 cm Test dose: negative and Other  Assessment Events: blood not aspirated, injection not painful, no injection resistance, negative IV test and no paresthesia  Additional Notes Patient identified. Risks and benefits discussed including failed block, incomplete  Pain control, post dural puncture headache, nerve damage, paralysis, blood pressure Changes, nausea, vomiting, reactions to medications-both toxic and allergic and post Partum back pain. All questions were answered. Patient expressed understanding and wished to proceed. Sterile technique was used throughout procedure. Epidural site was Dressed with sterile barrier dressing. No paresthesias, signs of intravascular injection Or signs of intrathecal spread were encountered.  Patient was more comfortable after the epidural was dosed. Please see RN's note for documentation of vital signs and FHR which are stable.

## 2015-02-12 NOTE — Progress Notes (Signed)
Patient ID: Ellen Camacho, female   DOB: September 22, 1985, 29 y.o.   MRN: [id number] Comfortable with epidural  Filed Vitals:   02/12/15 1058 02/12/15 1102 02/12/15 1131 02/12/15 1202  BP: 121/71 117/73 141/81 125/70  Pulse: 87 85 95 79  Temp: 98.1 F (36.7 C)     TempSrc:      Resp:      Height:      Weight:      SpO2: 100%      FHR reassuring on both twins, which are active  UCs every 2-4 min  Dilation: 4.5 Effacement (%): 50 Station: -2 Presentation: Vertex Exam by:: a. thacker rn  AROM clear fluid  Anticipate increased labor

## 2015-02-13 NOTE — Progress Notes (Signed)
Post Partum Day 1  Please see below medical student note for official documentation.  Subjective: Ellen Camacho is a 29 y.o. [id number] 5365w0d s/p SVD of Di/Di twins.  No acute events overnight.  Pt denies problems with ambulating, voiding or po intake.  She denies nausea or vomiting.  Pain is well controlled.  She has had flatus.  Lochia Small.  Plan for birth control is Nexplanon.  Method of Feeding: Bottle  Objective: Blood pressure 116/74, pulse 65, temperature 98 F (36.7 C), temperature source Oral, resp. rate 18, height 5\' 6"  (1.676 m), weight 77.111 kg (170 lb), last menstrual period 05/10/2014, SpO2 100 %, unknown if currently breastfeeding.  Physical Exam:  General: alert, cooperative and no distress Lochia:normal flow Chest: normal WOB Heart: Regular rate Abdomen: +BS, soft, mild TTP (appropriate) Uterine Fundus: firm, above the umbilicus DVT Evaluation: No evidence of DVT seen on physical exam; negative Homans sign Extremities: no edema   Recent Labs  02/12/15 0120  HGB 9.9*  HCT 30.4*    Assessment/Plan:  ASSESSMENT: Ellen Camacho is a 29 y.o. [id number] 8965w0d PPD #1 s/p SVD of Di/Di twins who is doing well. She reported that she is steadily improving after delivery yesterday afternoon. Her only complaint this morning was burning on urination which is to be expected given her laceration during yesterday's delivery. Otherwise she will continue with routine care.  Plan for discharge tomorrow Continue routine PP care Breastfeeding support PRN  LOS: 1 day   Henrietta HooverWarren M Taylor 02/13/2015, 7:58 AM    OB fellow attestation Post Partum Day 1 I have seen and examined this patient and agree with above documentation in the medical student's note.   Ellen Camacho is a 29 y.o. [id number] s/p NSVD.  Pt denies problems with ambulating, voiding or po intake. Pain is well controlled.  Plan for birth control is Nexplanon.  Method of Feeding: Bottle  PE:  BP 116/74 mmHg  Pulse  65  Temp(Src) 98 F (36.7 C) (Oral)  Resp 18  Ht 5\' 6"  (1.676 m)  Wt 170 lb (77.111 kg)  BMI 27.45 kg/m2  SpO2 100%  LMP 05/10/2014 (Approximate)  Breastfeeding? Unknown Gen: well appearing Heart: reg rate Lungs: normal WOB Fundus firm Ext: soft, no pain, no edema  Plan for discharge: PPD#2 Routine pp care  Federico FlakeKimberly Niles Brit Carbonell, MD 7:05 PM

## 2015-02-13 NOTE — Progress Notes (Signed)
CLINICAL SOCIAL WORK MATERNAL/CHILD NOTE  Patient Details  Name: Ellen Camacho MRN: [id number] Date of Birth: 02/12/2017  Date:  02/13/2015  Clinical Social Worker Initiating Note:  Vasilis Luhman, LCSW Date/ Time Initiated:  02/13/15/1319     Child's Name:      Legal Guardian:  Mother   Need for Interpreter:  None   Date of Referral:  02/12/15     Reason for Referral:  Other (Comment)   Referral Source:  NICU   Address:  [address]  [place], [place] [address]  Phone number:      Household Members:  Minor Children, Self   Natural Supports (not living in the home):  Immediate Family, Extended Family   Professional Supports: None   Employment: Unemployed   Type of Work:     Education:      Financial Resources:  Medicaid   Other Resources:  Food Stamps , WIC   Cultural/Religious Considerations Which May Impact Care:  none noted  Strengths:  Ability to meet basic needs    Risk Factors/Current Problems:  Substance Use    Cognitive State:  Alert , Able to Concentrate    Mood/Affect:  Happy    CSW Assessment:  Acknowledged order for social work consult to address concerns regarding hx of marijuana use.  Met with mother who was pleasant and receptive to CSW.  She is a single parent with 3 other children.  Her first two children Aurial and Azavier Scales D.O.B. 11/09/2000 and 11/25/2001 respectively resides with relatives.   Aurial resides with maternal grandmother in [place] and Armand resides with maternal aunt in [place].      Mother notes that this arrangement involved DSS.  She reportedly maintains custody of her 31 year old Armand Bowers.      Mother reports having excellent family support.   She and her 31 year old reside with maternal grandmother.  Mother notes that she is unsure of who is the biological father of the twins.  She communicate intent to have DNA testing.   Two of her children were present during CSW visit.  Revisited her substance abuse history  to allow for privacy.   MOB admits to occasional use of marijuana during pregnancy for nausea.  She denies any other illicit drug use.   Mother was informed of the hospital's drug screen policy and reason for the testing.   UDS on newborn is positive and mother informed of referral that will be made to DSS.   Mother reports hx of mental illness.   Informed that she was diagnosed with Bipolar31 last year and while receiving mental health services from [facility].  Informed that she was prescribed latuda, but stop taking it because she didn't believe it was very effective.  She also participated in about 4-5 therapy sessions.  She denies any current symptoms of depression or anxiety.   Twin A was admitted to NICU.  MOB seems to be coping well with the NICU admission.  She is not fully prepared for twins at home.  Informed her of services offered through    Informed her of social work availability.    CSW Plan/Description:     Case referred to DSS.  Spoke with Kayce Owens and informed that she will research the case and speak with the supervisor to determine if she needed to see patient in the hospital or wait to see her in the community.  CSW will continue to follow.  Tyquon Near J, LCSW 31/05/2014, 4:41 

## 2015-02-13 NOTE — Anesthesia Postprocedure Evaluation (Signed)
Anesthesia Post Note  Patient: Sharmon Leydendreaka N Requena  Procedure(s) Performed: * No procedures listed *  Patient location during evaluation: Mother Baby Anesthesia Type: Epidural Level of consciousness: awake and alert, oriented and patient cooperative Pain management: pain level controlled Vital Signs Assessment: post-procedure vital signs reviewed and stable Respiratory status: spontaneous breathing Cardiovascular status: stable Postop Assessment: no headache, patient able to bend at knees and no signs of nausea or vomiting Anesthetic complications: no    Last Vitals:  Filed Vitals:   02/13/15 0600 02/13/15 0618  BP: 130/89 116/74  Pulse: 64 65  Temp: 36.7 C 36.7 C  Resp: 18 18    Last Pain:  Filed Vitals:   02/13/15 1253  PainSc: 3                  Kailoni Vahle

## 2015-02-14 MED ORDER — MEDROXYPROGESTERONE ACETATE 150 MG/ML IM SUSP
150.0000 mg | Freq: Once | INTRAMUSCULAR | Status: AC
  Administered 2015-02-14: 150 mg via INTRAMUSCULAR
  Filled 2015-02-14: qty 1

## 2015-02-14 MED ORDER — OXYCODONE-ACETAMINOPHEN 5-325 MG PO TABS: 1.0000 | ORAL_TABLET | ORAL | Status: DC | PRN

## 2015-02-14 MED ORDER — IBUPROFEN 600 MG PO TABS: 600.0000 mg | ORAL_TABLET | Freq: Four times a day (QID) | ORAL | Status: DC

## 2015-02-14 MED ORDER — ACETAMINOPHEN 325 MG PO TABS: 650.0000 mg | ORAL_TABLET | Freq: Four times a day (QID) | ORAL | Status: DC | PRN

## 2015-02-14 NOTE — Discharge Instructions (Signed)

## 2015-02-14 NOTE — Discharge Summary (Signed)
OB Discharge Summary     Patient Name: Ellen Camacho DOB: September 03, 1985 MRN: [id number]  Date of admission: 02/12/2015 Delivering MD:    Raynaldo OpitzMartin, GirlA Aeliana [[id number]][[id number]]  Jasper LoserWILLIAMS, MARIE L   Jacobson, BoyB MandanEdreaka [[id number]][[id number]]  Wynelle BourgeoisWILLIAMS, MARIE L   Date of discharge: 02/14/2015  Admitting diagnosis: TWINS DIRECT ADMIT INDUCTION Intrauterine pregnancy: 5745w0d     Secondary diagnosis:  Active Problems:   Polyhydramnios Twin gestation  Additional problems: none     Discharge diagnosis: Term Pregnancy Delivered                                                                                                Post partum procedures:none  Augmentation: AROM, Pitocin and Foley Balloon  Complications: None  Hospital course:  Induction of Labor With Vaginal Delivery   29 y.o. yo [id number] at 1845w0d was admitted to the hospital 02/12/2015 for induction of labor.  Indication for induction: IOL for lagging growth, decreased fluid and increased dopplers in Twin A..  Patient had an uncomplicated labor course as follows: Membrane Rupture Time/Date:    Raynaldo OpitzMartin, GirlA Leshea [[id number]][[id number]]  12:10 PM   Quentin CornwallMartin, BoyB [place] [[id number]][[id number]]  12:10 PM ,   Raynaldo OpitzMartin, GirlA Maleigha [[id number]][[id number]]  02/12/2015   Ansel BongMartin, BoyB Chestina [[id number]][[id number]]  02/12/2015   Intrapartum Procedures: Episiotomy:    Raynaldo OpitzMartin, GirlA Jonita [[id number]][[id number]]  None [1]   Quentin CornwallMartin, [facility] [[id number]][[id number]]  None [1]                                         Lacerations:     Raynaldo OpitzMartin, GirlA [place] [[id number]][[id number]]  1st degree [2];Labial [10]   Ansel BongMartin, BoyB Buena [[id number]][[id number]]  1st degree [2];Labial [10]  Patient had delivery of two Viable infants. Information for the patient's newborn:  Raynaldo OpitzMartin, GirlA Jazzmen [[id number]][[id number]]  Delivery Method: Vaginal, Spontaneous Delivery (Filed from Delivery Summary) Information for the patient's newborn:  Ansel BongMartin, BoyB Shavawn [[id number]][[id number]]  Delivery Method: Sela HuaVag-Spont     Allsup, GirlA Christalynn [[id number]][[id number]]   02/12/2015   Ansel BongMartin, BoyB Leonora [[id number]][[id number]]  02/12/2015  Details of delivery can be found in separate delivery note.  Patient had a routine postpartum course. Patient is discharged home 02/14/2015. Depo will be given prior to discharge (patient not planning on breastfeeding).     Physical exam  Filed Vitals:   02/12/15 2330 02/13/15 0600 02/13/15 0618 02/13/15 1900  BP: 123/82 130/89 116/74 138/90  Pulse: 85 64 65 93  Temp: 98.4 F (36.9 C) 98.1 F (36.7 C) 98 F (36.7 C) 98.3 F (36.8 C)  TempSrc: Oral Oral Oral Oral  Resp: 18 18 18 18   Height:      Weight:      SpO2: 100% 100%  98%   General: alert, cooperative and no distress Lochia: appropriate Uterine Fundus: firm Incision: N/A DVT Evaluation: No evidence of DVT seen on physical exam. No cords or calf tenderness. No significant calf/ankle edema. Labs: Lab Results  Component Value Date   WBC 12.8* 02/12/2015  HGB 9.9* 02/12/2015   HCT 30.4* 02/12/2015   MCV 84.7 02/12/2015   PLT 274 02/12/2015   CMP Latest Ref Rng 07/31/2014  Glucose 65 - 99 mg/dL 88  BUN 6 - 20 mg/dL 11  Creatinine 9.60 - 4.54 mg/dL 0.98  Sodium 119 - 147 mmol/L 132(L)  Potassium 3.5 - 5.1 mmol/L 3.6  Chloride 101 - 111 mmol/L 99(L)  CO2 22 - 32 mmol/L 24  Calcium 8.9 - 10.3 mg/dL 9.1  Total Protein 6.5 - 8.1 g/dL 7.7  Total Bilirubin 0.3 - 1.2 mg/dL 8.2(N)  Alkaline Phos 38 - 126 U/L 47  AST 15 - 41 U/L 14(L)  ALT 14 - 54 U/L 13(L)    Discharge instruction: per After Visit Summary and "Baby and Me Booklet".  After visit meds:    Medication List    STOP taking these medications        acyclovir 400 MG tablet  Commonly known as:  ZOVIRAX     NIFEdipine 10 MG capsule  Commonly known as:  PROCARDIA      TAKE these medications        acetaminophen 325 MG tablet  Commonly known as:  TYLENOL  Take 2 tablets (650 mg total) by mouth every 6 (six) hours as needed (for pain scale < 4).     CONCEPT OB 130-92.4-1 MG Caps  Take  1 tablet by mouth daily.     ibuprofen 600 MG tablet  Commonly known as:  ADVIL,MOTRIN  Take 1 tablet (600 mg total) by mouth every 6 (six) hours.     oxyCODONE-acetaminophen 5-325 MG tablet  Commonly known as:  PERCOCET/ROXICET  Take 1 tablet by mouth every 4 (four) hours as needed (for pain scale 4-7).        Diet: routine diet  Activity: Advance as tolerated. Pelvic rest for 6 weeks.   Outpatient follow up:6 weeks Follow up Appt:Future Appointments Date Time Provider Department Center  02/18/2015 9:00 AM [facility] [place] [facility]  02/24/2015 10:30 AM [facility] [place] 2 [facility]  03/12/2015 12:00 AM [facility] ROOM [facility] None  03/31/2015 1:15 PM Catalina Antigua, MD [facility]   Follow up Visit:No Follow-up on file.  Postpartum contraception: Depo Provera (given prior to discharge)  Newborn Data:   Cambreigh, Dearing [[id number]]  Live born female  Birth Weight: 4 lb 6.2 oz (1990 g) APGAR: Nikki Dom [address]   Fiza, Nation [[id number]]  Live born female  Birth Weight: 5 lb 0.1 oz (2270 g) APGAR: 10, 10  Baby Feeding: Bottle Disposition:to be determined   02/14/2015 Silvano Bilis, MD

## 2015-02-15 ENCOUNTER — Ambulatory Visit: Payer: Self-pay

## 2015-02-15 NOTE — Lactation Note (Signed)
This note was copied from the chart of BoyB Tana Feltsdreaka Stamour. Lactation Consultation Note LC [facility], Fannie KneeSue requesting assist with mom's engorgement post discharge. Encouraged RN to assist with basics or engorgement care with tight fitting bra, no stimulation, ice as needed and cabbage leaves,  Mom is not providing breast milk to her baby and does not plan to.    Patient Name: Ellen Camacho'VToday's Date: 02/15/2015     Maternal Data    Feeding    LATCH Score/Interventions                      Lactation Tools Discussed/Used     Consult Status      Jazyiah Yiu, Arvella MerlesJana Lynn 02/15/2015, 8:42 PM

## 2015-02-17 NOTE — Progress Notes (Signed)
Post discharge chart review completed.  

## 2015-02-18 ENCOUNTER — Encounter: Payer: Medicaid Other | Admitting: Obstetrics & Gynecology

## 2015-02-18 ENCOUNTER — Ambulatory Visit (HOSPITAL_COMMUNITY): Admission: RE | Admit: 2015-02-18 | Payer: Medicaid Other | Source: Ambulatory Visit

## 2015-02-24 ENCOUNTER — Ambulatory Visit (HOSPITAL_COMMUNITY): Payer: Medicaid Other

## 2015-02-25 ENCOUNTER — Encounter: Payer: Medicaid Other | Admitting: Family

## 2015-02-26 ENCOUNTER — Telehealth: Payer: Self-pay | Admitting: Obstetrics and Gynecology

## 2015-02-26 NOTE — Telephone Encounter (Signed)
Had to leave a message asking patient to come in today before 12 pm to get her BP checked.

## 2015-03-09 ENCOUNTER — Telehealth (HOSPITAL_COMMUNITY): Payer: Self-pay | Admitting: *Deleted

## 2015-03-09 NOTE — Telephone Encounter (Signed)
Preadmission screen  

## 2015-03-12 ENCOUNTER — Inpatient Hospital Stay (HOSPITAL_COMMUNITY): Admission: RE | Admit: 2015-03-12 | Payer: Medicaid Other | Source: Ambulatory Visit

## 2015-03-31 ENCOUNTER — Ambulatory Visit: Payer: Medicaid Other | Admitting: Advanced Practice Midwife

## 2015-05-04 ENCOUNTER — Encounter: Payer: Self-pay | Admitting: *Deleted
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
## 2015-11-29 IMAGING — US US OB TRANSVAGINAL
1 series · 14 of 28 positions shown · non-contrast
Comparison: Pelvic ultrasound 07/28/2014

CLINICAL DATA: Evaluate for fetal viability.

EXAM:
US OB TRANSVAGINAL

[Series 1: us ob follow up · 14 of 34 slices shown]
[im 2/34]
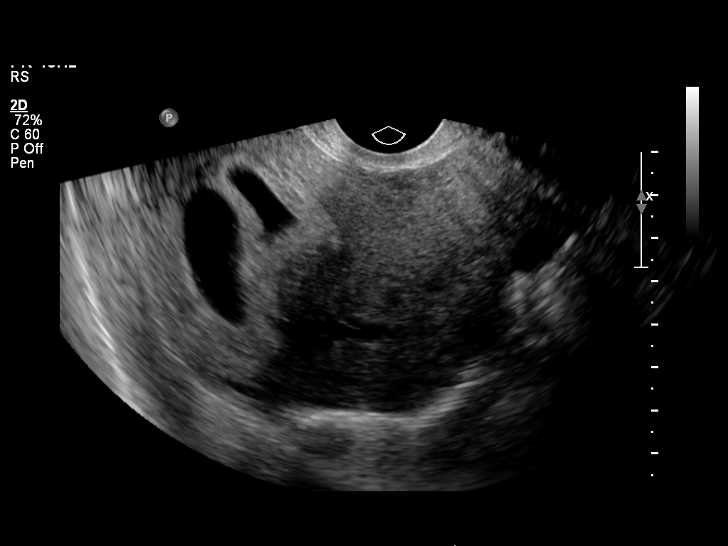
[im 4/34]
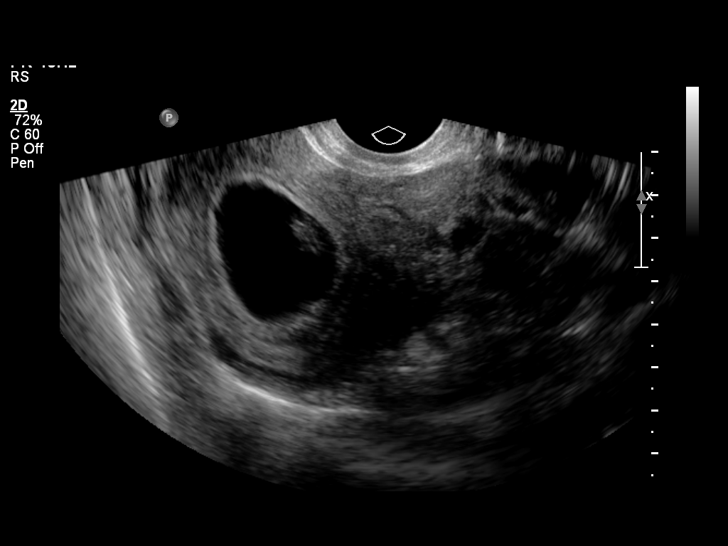
[im 7/34]
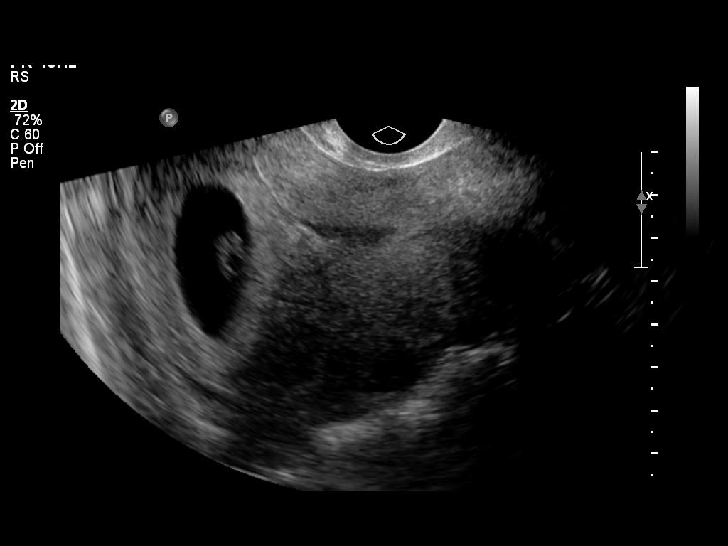
[im 9/34]
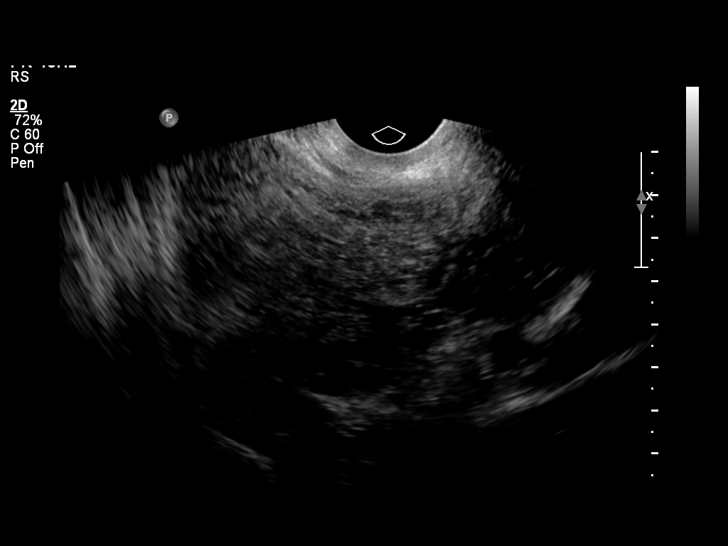
[im 12/34]
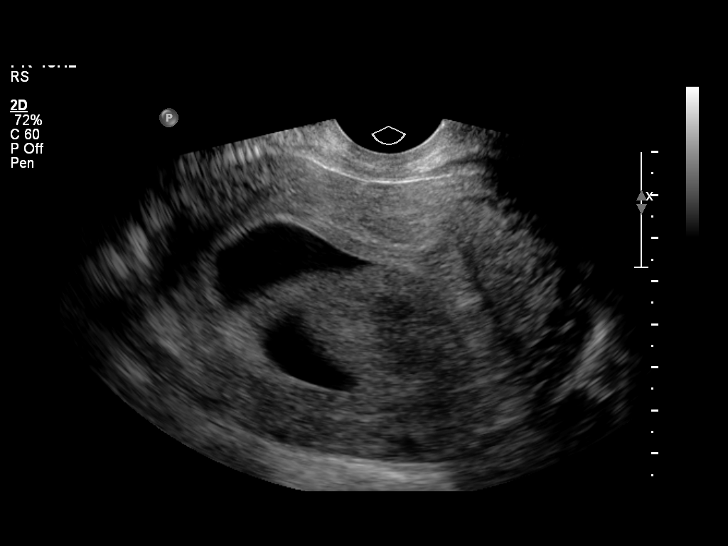
[im 14/34]
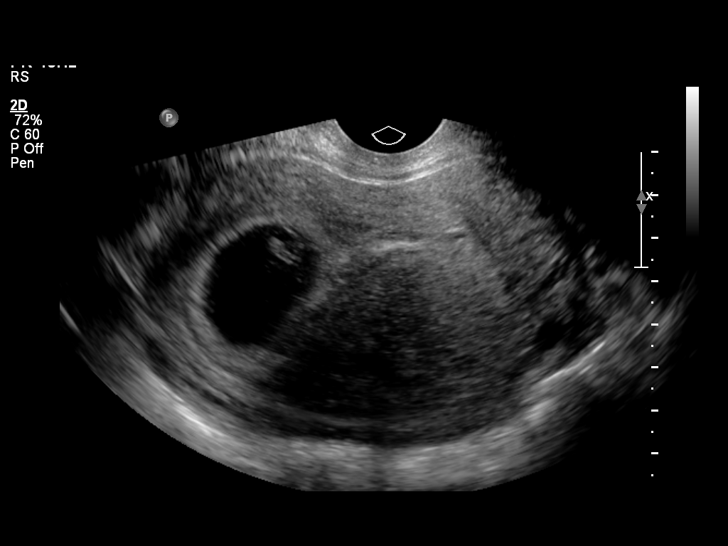
[im 16/34]
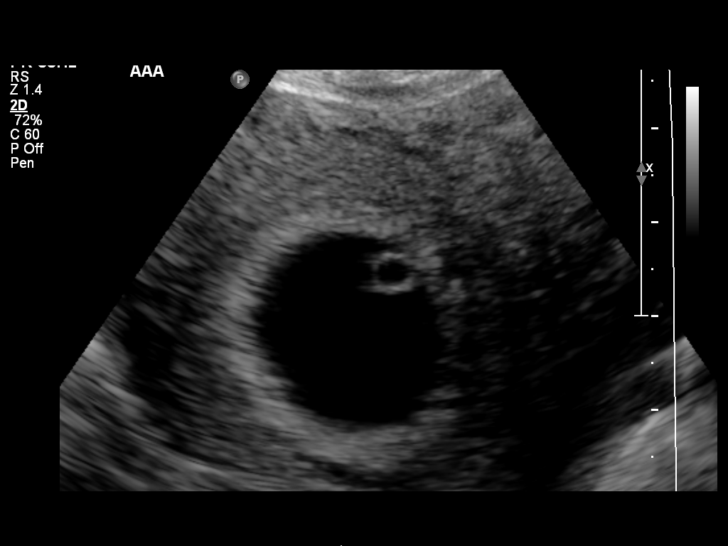
[im 19/34]
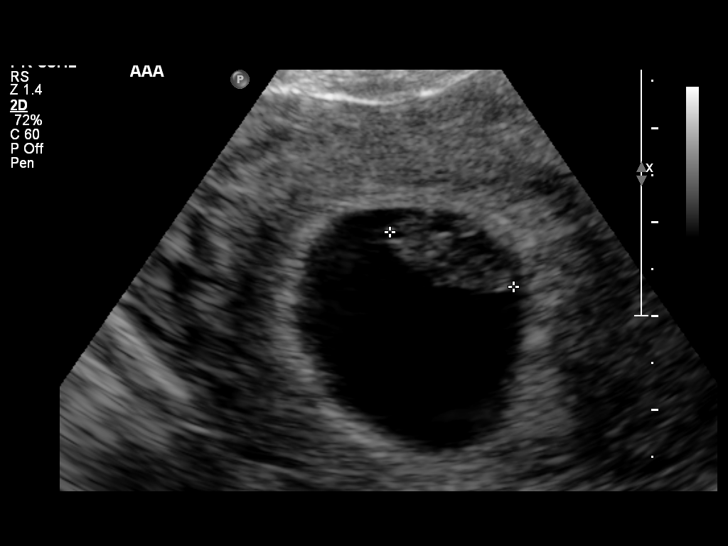
[im 21/34]
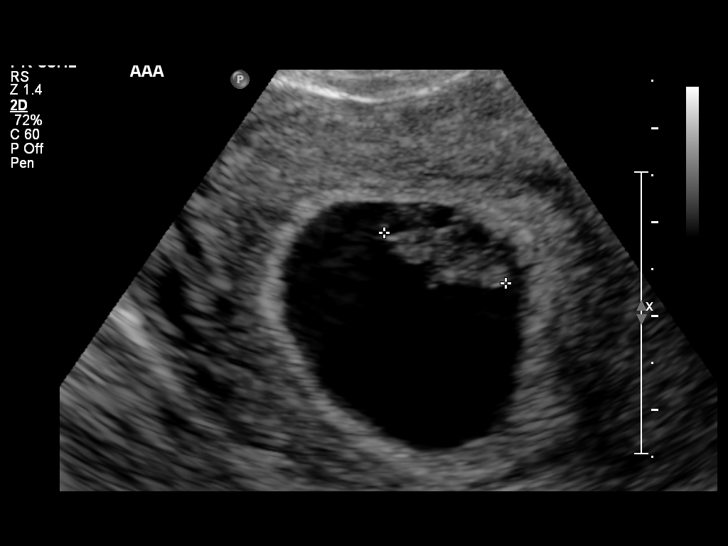
[im 24/34]
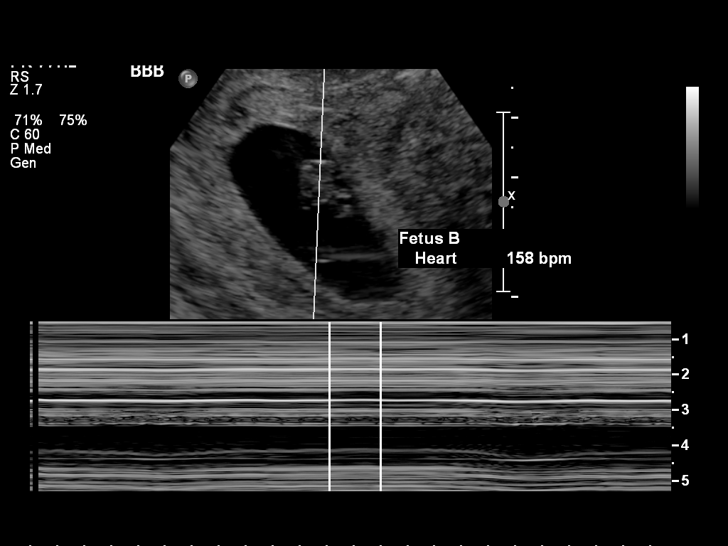
[im 26/34]
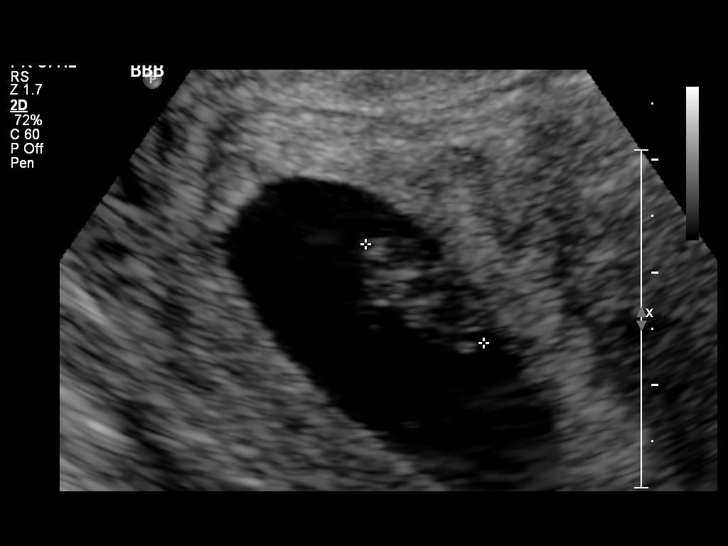
[im 29/34]
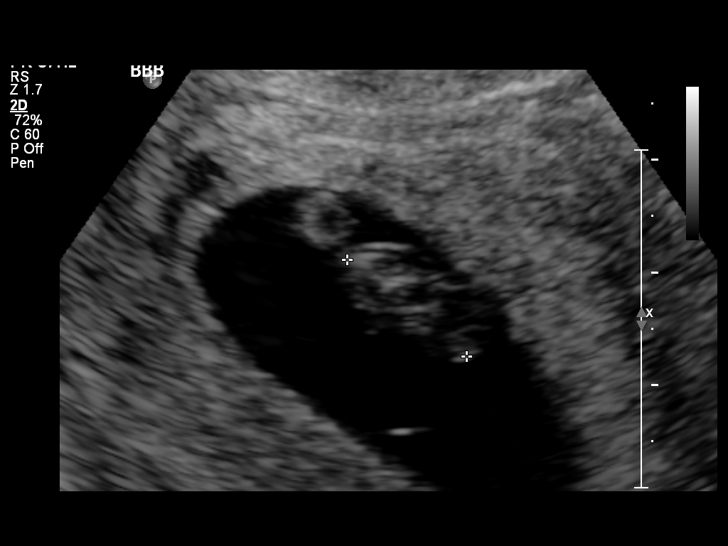
[im 31/34]
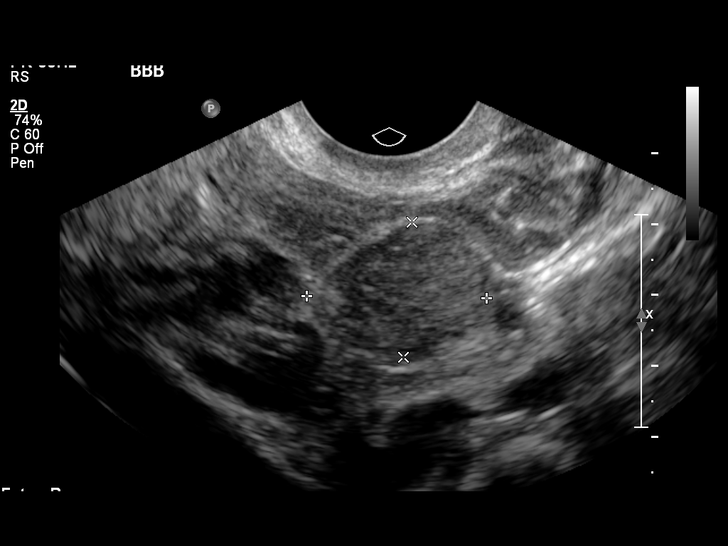
[im 34/34]
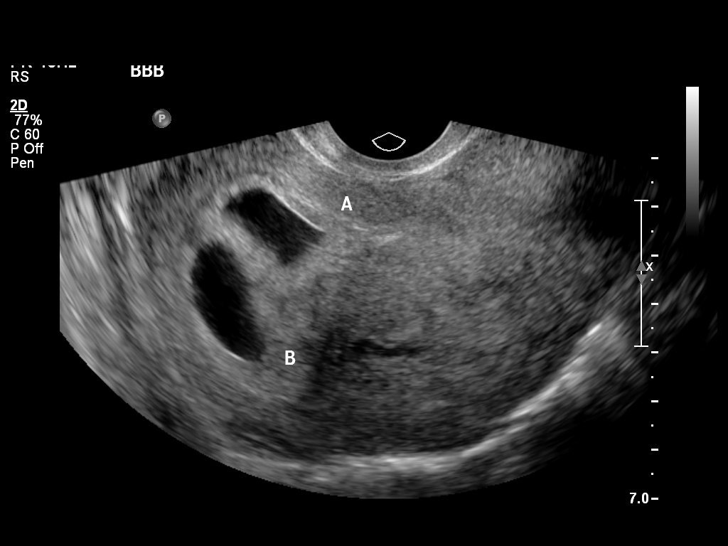

[14 of 28 positions shown; findings below may reference images not displayed]

FINDINGS: TWIN 1

Intrauterine gestational sac: Visualized/normal in shape.

Yolk sac:  Present

Embryo:  Present

Cardiac Activity: Present

Heart Rate: 160 bpm

CRL:  14.2  mm   7 w 6 d                  US EDC: 03/13/2015

TWIN 2

Intrauterine gestational sac: Visualized/normal in shape.

Yolk sac:  Present

Embryo:  Present

Cardiac Activity: Present

Heart Rate: 158 bpm

CRL:  14.1  mm   7 w 5 d                  US EDC: 03/13/2015

Maternal uterus/adnexae: The right ovary is normal. The left ovary
is not visualized. No subchorionic hemorrhage. No free fluid in the
pelvis.
IMPRESSION: Suspected dichorionic diamniotic live twin gestations 7 weeks 6 days
by crown-rump length.

## 2016-02-10 LAB — OB RESULTS CONSOLE GBS: GBS: NEGATIVE

## 2016-03-13 NOTE — L&D Delivery Note (Signed)
31 y.o. [id number] at 4976w1d delivered a viable female infant in cephalic, ROA position. 1x loose nuchal cord, easily reduced. Anterior shoulder delivered with ease. 60 sec delayed cord clamping. Cord clamped x2 and cut. Placenta delivered spontaneously intact, with 3VC. Fundus firm on exam with massage and pitocin. Good hemostasis noted.  Laceration: superficial bilateral labial Suture: none Good hemostasis noted. EBL 150cc  Mom and baby recovering in LDR.    Apgars: 8/9 Weight: pending    Renne Muscaaniel L Tiandre Teall, MD PGY-1 04/24/2016, 10:55 PM

## 2016-04-07 ENCOUNTER — Inpatient Hospital Stay (HOSPITAL_COMMUNITY)
Admission: AD | Admit: 2016-04-07 | Discharge: 2016-04-07 | Disposition: A | Discharge status: Home / Self Care | Payer: Medicaid Other | Source: Ambulatory Visit | Attending: Obstetrics & Gynecology | Admitting: Obstetrics & Gynecology

## 2016-04-07 ENCOUNTER — Inpatient Hospital Stay (HOSPITAL_COMMUNITY): Payer: Medicaid Other

## 2016-04-07 ENCOUNTER — Encounter (HOSPITAL_COMMUNITY): Payer: Self-pay | Admitting: *Deleted

## 2016-04-07 DIAGNOSIS — Z3A37 37 weeks gestation of pregnancy: Secondary | ICD-10-CM

## 2016-04-07 DIAGNOSIS — Z3687 Encounter for antenatal screening for uncertain dates: Secondary | ICD-10-CM | POA: Diagnosis not present

## 2016-04-07 DIAGNOSIS — O0933 Supervision of pregnancy with insufficient antenatal care, third trimester: Secondary | ICD-10-CM | POA: Insufficient documentation

## 2016-04-07 DIAGNOSIS — O9989 Other specified diseases and conditions complicating pregnancy, childbirth and the puerperium: Secondary | ICD-10-CM | POA: Diagnosis not present

## 2016-04-07 DIAGNOSIS — O0930 Supervision of pregnancy with insufficient antenatal care, unspecified trimester: Secondary | ICD-10-CM

## 2016-04-07 DIAGNOSIS — Z3689 Encounter for other specified antenatal screening: Secondary | ICD-10-CM | POA: Insufficient documentation

## 2016-04-07 DIAGNOSIS — O479 False labor, unspecified: Secondary | ICD-10-CM

## 2016-04-07 LAB — DIFFERENTIAL
BASOS ABS: 0 10*3/uL (ref 0.0–0.1)
Basophils Relative: 0 %
EOS ABS: 0 10*3/uL (ref 0.0–0.7)
Eosinophils Relative: 0 %
LYMPHS ABS: 2.1 10*3/uL (ref 0.7–4.0)
Lymphocytes Relative: 22 %
Monocytes Absolute: 0.8 10*3/uL (ref 0.1–1.0)
Monocytes Relative: 9 %
NEUTROS ABS: 6.7 10*3/uL (ref 1.7–7.7)
NEUTROS PCT: 69 %

## 2016-04-07 LAB — GROUP B STREP BY PCR: Group B strep by PCR: NEGATIVE

## 2016-04-07 LAB — CBC
HEMATOCRIT: 31.4 % — AB (ref 36.0–46.0)
HEMOGLOBIN: 10.6 g/dL — AB (ref 12.0–15.0)
MCH: 29.1 pg (ref 26.0–34.0)
MCHC: 33.8 g/dL (ref 30.0–36.0)
MCV: 86.3 fL (ref 78.0–100.0)
Platelets: 196 10*3/uL (ref 150–400)
RBC: 3.64 MIL/uL — AB (ref 3.87–5.11)
RDW: 14.2 % (ref 11.5–15.5)
WBC: 9.6 10*3/uL (ref 4.0–10.5)

## 2016-04-07 LAB — WET PREP, GENITAL
CLUE CELLS WET PREP: NONE SEEN
SPERM: NONE SEEN
TRICH WET PREP: NONE SEEN
WBC WET PREP: NONE SEEN
YEAST WET PREP: NONE SEEN

## 2016-04-07 LAB — OB RESULTS CONSOLE GC/CHLAMYDIA: Gonorrhea: NEGATIVE

## 2016-04-07 LAB — HEPATITIS B SURFACE ANTIGEN: HEP B S AG: NEGATIVE

## 2016-04-07 LAB — TYPE AND SCREEN
ABO/RH(D): O POS
ANTIBODY SCREEN: NEGATIVE

## 2016-04-07 NOTE — Discharge Instructions (Signed)
Third Trimester of Pregnancy The third trimester is from week 29 through week 40 (months 7 through 9). The third trimester is a time when the unborn baby (fetus) is growing rapidly. At the end of the ninth month, the fetus is about 20 inches in length and weighs 6-10 pounds. Body changes during your third trimester Your body goes through many changes during pregnancy. The changes vary from woman to woman. During the third trimester:  Your weight will continue to increase. You can expect to gain 25-35 pounds (11-16 kg) by the end of the pregnancy.  You may begin to get stretch marks on your hips, abdomen, and breasts.  You may urinate more often because the fetus is moving lower into your pelvis and pressing on your bladder.  You may develop or continue to have heartburn. This is caused by increased hormones that slow down muscles in the digestive tract.  You may develop or continue to have constipation because increased hormones slow digestion and cause the muscles that push waste through your intestines to relax.  You may develop hemorrhoids. These are swollen veins (varicose veins) in the rectum that can itch or be painful.  You may develop swollen, bulging veins (varicose veins) in your legs.  You may have increased body aches in the pelvis, back, or thighs. This is due to weight gain and increased hormones that are relaxing your joints.  You may have changes in your hair. These can include thickening of your hair, rapid growth, and changes in texture. Some women also have hair loss during or after pregnancy, or hair that feels dry or thin. Your hair will most likely return to normal after your baby is born.  Your breasts will continue to grow and they will continue to become tender. A yellow fluid (colostrum) may leak from your breasts. This is the first milk you are producing for your baby.  Your belly button may stick out.  You may notice more swelling in your hands, face, or  ankles.  You may have increased tingling or numbness in your hands, arms, and legs. The skin on your belly may also feel numb.  You may feel short of breath because of your expanding uterus.  You may have more problems sleeping. This can be caused by the size of your belly, increased need to urinate, and an increase in your body's metabolism.  You may notice the fetus "dropping," or moving lower in your abdomen.  You may have increased vaginal discharge.  Your cervix becomes thin and soft (effaced) near your due date. What to expect at prenatal visits You will have prenatal exams every 2 weeks until week 36. Then you will have weekly prenatal exams. During a routine prenatal visit:  You will be weighed to make sure you and the fetus are growing normally.  Your blood pressure will be taken.  Your abdomen will be measured to track your baby's growth.  The fetal heartbeat will be listened to.  Any test results from the previous visit will be discussed.  You may have a cervical check near your due date to see if you have effaced. At around 36 weeks, your health care provider will check your cervix. At the same time, your health care provider will also perform a test on the secretions of the vaginal tissue. This test is to determine if a type of bacteria, Group B streptococcus, is present. Your health care provider will explain this further. Your health care provider may ask you:    What your birth plan is.  How you are feeling.  If you are feeling the baby move.  If you have had any abnormal symptoms, such as leaking fluid, bleeding, severe headaches, or abdominal cramping.  If you are using any tobacco products, including cigarettes, chewing tobacco, and electronic cigarettes.  If you have any questions. Other tests or screenings that may be performed during your third trimester include:  Blood tests that check for low iron levels (anemia).  Fetal testing to check the health,  activity level, and growth of the fetus. Testing is done if you have certain medical conditions or if there are problems during the pregnancy.  Nonstress test (NST). This test checks the health of your baby to make sure there are no signs of problems, such as the baby not getting enough oxygen. During this test, a belt is placed around your belly. The baby is made to move, and its heart rate is monitored during movement. What is false labor? False labor is a condition in which you feel small, irregular tightenings of the muscles in the womb (contractions) that eventually go away. These are called Braxton Hicks contractions. Contractions may last for hours, days, or even weeks before true labor sets in. If contractions come at regular intervals, become more frequent, increase in intensity, or become painful, you should see your health care provider. What are the signs of labor?  Abdominal cramps.  Regular contractions that start at 10 minutes apart and become stronger and more frequent with time.  Contractions that start on the top of the uterus and spread down to the lower abdomen and back.  Increased pelvic pressure and dull back pain.  A watery or bloody mucus discharge that comes from the vagina.  Leaking of amniotic fluid. This is also known as your "water breaking." It could be a slow trickle or a gush. Let your doctor know if it has a color or strange odor. If you have any of these signs, call your health care provider right away, even if it is before your due date. Follow these instructions at home: Eating and drinking  Continue to eat regular, healthy meals.  Do not eat:  Raw meat or meat spreads.  Unpasteurized milk or cheese.  Unpasteurized juice.  Store-made salad.  Refrigerated smoked seafood.  Hot dogs or deli meat, unless they are piping hot.  More than 6 ounces of albacore tuna a week.  Shark, swordfish, king mackerel, or tile fish.  Store-made salads.  Raw  sprouts, such as mung bean or alfalfa sprouts.  Take prenatal vitamins as told by your health care provider.  Take 1000 mg of calcium daily as told by your health care provider.  If you develop constipation:  Take over-the-counter or prescription medicines.  Drink enough fluid to keep your urine clear or pale yellow.  Eat foods that are high in fiber, such as fresh fruits and vegetables, whole grains, and beans.  Limit foods that are high in fat and processed sugars, such as fried and sweet foods. Activity  Exercise only as directed by your health care provider. Healthy pregnant women should aim for 2 hours and 30 minutes of moderate exercise per week. If you experience any pain or discomfort while exercising, stop.  Avoid heavy lifting.  Do not exercise in extreme heat or humidity, or at high altitudes.  Wear low-heel, comfortable shoes.  Practice good posture.  Do not travel far distances unless it is absolutely necessary and only with the approval   of your health care provider.  Wear your seat belt at all times while in a car, on a bus, or on a plane.  Take frequent breaks and rest with your legs elevated if you have leg cramps or low back pain.  Do not use hot tubs, steam rooms, or saunas.  You may continue to have sex unless your health care provider tells you otherwise. Lifestyle  Do not use any products that contain nicotine or tobacco, such as cigarettes and e-cigarettes. If you need help quitting, ask your health care provider.  Do not drink alcohol.  Do not use any medicinal herbs or unprescribed drugs. These chemicals affect the formation and growth of the baby.  If you develop varicose veins:  Wear support pantyhose or compression stockings as told by your healthcare provider.  Elevate your feet for 15 minutes, 3-4 times a day.  Wear a supportive maternity bra to help with breast tenderness. General instructions  Take over-the-counter and prescription  medicines only as told by your health care provider. There are medicines that are either safe or unsafe to take during pregnancy.  Take warm sitz baths to soothe any pain or discomfort caused by hemorrhoids. Use hemorrhoid cream or witch hazel if your health care provider approves.  Avoid cat litter boxes and soil used by cats. These carry germs that can cause birth defects in the baby. If you have a cat, ask someone to clean the litter box for you.  To prepare for the arrival of your baby:  Take prenatal classes to understand, practice, and ask questions about the labor and delivery.  Make a trial run to the hospital.  Visit the hospital and tour the maternity area.  Arrange for maternity or paternity leave through employers.  Arrange for family and friends to take care of pets while you are in the hospital.  Purchase a rear-facing car seat and make sure you know how to install it in your car.  Pack your hospital bag.  Prepare the baby's nursery. Make sure to remove all pillows and stuffed animals from the baby's crib to prevent suffocation.  Visit your dentist if you have not gone during your pregnancy. Use a soft toothbrush to brush your teeth and be gentle when you floss.  Keep all prenatal follow-up visits as told by your health care provider. This is important. Contact a health care provider if:  You are unsure if you are in labor or if your water has broken.  You become dizzy.  You have mild pelvic cramps, pelvic pressure, or nagging pain in your abdominal area.  You have lower back pain.  You have persistent nausea, vomiting, or diarrhea.  You have an unusual or bad smelling vaginal discharge.  You have pain when you urinate. Get help right away if:  You have a fever.  You are leaking fluid from your vagina.  You have spotting or bleeding from your vagina.  You have severe abdominal pain or cramping.  You have rapid weight loss or weight gain.  You have  shortness of breath with chest pain.  You notice sudden or extreme swelling of your face, hands, ankles, feet, or legs.  Your baby makes fewer than 10 movements in 2 hours.  You have severe headaches that do not go away with medicine.  You have vision changes. Summary  The third trimester is from week 29 through week 40, months 7 through 9. The third trimester is a time when the unborn baby (fetus)   is growing rapidly.  During the third trimester, your discomfort may increase as you and your baby continue to gain weight. You may have abdominal, leg, and back pain, sleeping problems, and an increased need to urinate.  During the third trimester your breasts will keep growing and they will continue to become tender. A yellow fluid (colostrum) may leak from your breasts. This is the first milk you are producing for your baby.  False labor is a condition in which you feel small, irregular tightenings of the muscles in the womb (contractions) that eventually go away. These are called Braxton Hicks contractions. Contractions may last for hours, days, or even weeks before true labor sets in.  Signs of labor can include: abdominal cramps; regular contractions that start at 10 minutes apart and become stronger and more frequent with time; watery or bloody mucus discharge that comes from the vagina; increased pelvic pressure and dull back pain; and leaking of amniotic fluid. This information is not intended to replace advice given to you by your health care provider. Make sure you discuss any questions you have with your health care provider. Document Released: 02/21/2001 Document Revised: 08/05/2015 Document Reviewed: 04/30/2012 [employer] Interactive Patient Education  2017 [employer]  

## 2016-04-07 NOTE — MAU Provider Note (Signed)
Obstetric Resident MAU Note  Chief Complaint:  Contractions   HPI: Ellen Camacho is a 31 y.o. [id number] at 4253w5d who presents to maternity admissions reporting persistent contractions. She reports not having any prenatal care with this pregnancy. She had a short interval between her previous pregnancy and this current one. She recently had twins. Reports having some depression upon discovering that she was pregnant that has led to a delay in seeking further prenatal care.   Denies contractions, leakage of fluid or vaginal bleeding. Good fetal movement.   Pregnancy Course: Currently does not receive care anywhere. [facility]   Patient Active Problem List   Diagnosis Date Noted  . Polyhydramnios 02/12/2015  . 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 Drug use affecting pregnancy, antepartum 11/17/2014  . Supervision of high risk pregnancy, antepartum 11/12/2014  . Dichorionic diamniotic twin pregnancy, antepartum 11/12/2014  . Tobacco use in pregnancy, antepartum 11/12/2014  . Limited prenatal care, antepartum 10/31/2014    Past Medical History:  Diagnosis Date  . Anemia   . Headache   . HSV infection   . Seasonal allergies   . STD (female)     OB History  Gravida Para Term Preterm AB Living  6 4 3 1 1 5   SAB TAB Ectopic Multiple Live Births  1 0 0 1 5    # Outcome Date GA Lbr Len/2nd Weight Sex Delivery Anes PTL Lv  6 Current           5A Preterm 02/12/15 6825w0d 08:09 / 00:15 4 lb 6.2 oz (1.99 kg) F Vag-Spont EPI  LIV  5B Preterm 02/12/15 8325w0d 08:09 / 00:50 5 lb 0.1 oz (2.27 kg) M Vag-Spont EPI  LIV  4 Term 12/08/05 5142w0d   M    LIV  3 Term 11/25/01 3042w0d   M    LIV  2 Term 12/10/00 7942w0d   F    LIV  1 SAB               Past Surgical History:  Procedure Laterality Date  . NO PAST SURGERIES      Family History: Family History  Problem Relation Age of Onset  . Cancer Mother   . Diabetes Mother   . Hypertension Mother   . Heart  disease Mother   . Stroke Brother   . Cancer Maternal Grandfather     Social History: Social History  Substance Use Topics  . Smoking status: Current Some Day Smoker    Packs/day: 0.50    Types: Cigarettes    Last attempt to quit: 07/21/2014  . Smokeless tobacco: Never Used  . Alcohol use No    Allergies: No Known Allergies  No prescriptions prior to admission.    ROS: Pertinent findings in history of present illness.  Physical Exam  Blood pressure 112/64, pulse 92, temperature 98.5 F (36.9 C), resp. rate 18, height 5\' 6"  (1.676 m), weight 149 lb (67.6 kg), last menstrual period 06/26/2015, unknown if currently breastfeeding. CONSTITUTIONAL: Well-developed, well-nourished female in no acute distress.  HENT:  Normocephalic, atraumatic, moist mucus membranes EYES: Conjunctivae normal. No scleral icterus.  NECK: Normal range of motion, supple SKIN: Skin is warm and dry. No rash noted. Not diaphoretic. No erythema. No pallor. NEUROLGIC: Alert and oriented to person, place, and time. No focal defects PSYCHIATRIC: Normal mood and affect. Normal behavior. Normal judgment and thought content. CARDIOVASCULAR: Normal heart rate noted, regular rhythm RESPIRATORY: Effort and breath  sounds normal, no problems with respiration noted ABDOMEN: Soft, nontender, nondistended, gravid appropriate for gestational age MUSCULOSKELETAL: Normal range of motion. No edema and no tenderness. 2+ distal pulses.  Dilation: 1.5 Effacement (%): 60 Station: Ballotable Presentation: Vertex Exam by:: Shadi Larner/ campbell  FHT:  Baseline 140 , moderate variability, accelerations present, no decelerations Contractions: no regular contractions   Labs: Results for orders placed or performed during the hospital encounter of 04/07/16 (from the past 24 hour(s))  Wet prep, genital     Status: None   Collection Time: 04/07/16 12:45 PM  Result Value Ref Range   Yeast Wet Prep HPF POC NONE SEEN NONE SEEN   Trich,  Wet Prep NONE SEEN NONE SEEN   Clue Cells Wet Prep HPF POC NONE SEEN NONE SEEN   WBC, Wet Prep HPF POC NONE SEEN NONE SEEN   Sperm NONE SEEN   Group B strep by PCR     Status: None   Collection Time: 04/07/16 12:45 PM  Result Value Ref Range   Group B strep by PCR NEGATIVE NEGATIVE  CBC     Status: Abnormal   Collection Time: 04/07/16 12:46 PM  Result Value Ref Range   WBC 9.6 4.0 - 10.5 K/uL   RBC 3.64 (L) 3.87 - 5.11 MIL/uL   Hemoglobin 10.6 (L) 12.0 - 15.0 g/dL   HCT 16.1 (L) 09.6 - 04.5 %   MCV 86.3 78.0 - 100.0 fL   MCH 29.1 26.0 - 34.0 pg   MCHC 33.8 30.0 - 36.0 g/dL   RDW 40.9 81.1 - 91.4 %   Platelets 196 150 - 400 K/uL  Differential     Status: None   Collection Time: 04/07/16 12:46 PM  Result Value Ref Range   Neutrophils Relative % 69 %   Neutro Abs 6.7 1.7 - 7.7 K/uL   Lymphocytes Relative 22 %   Lymphs Abs 2.1 0.7 - 4.0 K/uL   Monocytes Relative 9 %   Monocytes Absolute 0.8 0.1 - 1.0 K/uL   Eosinophils Relative 0 %   Eosinophils Absolute 0.0 0.0 - 0.7 K/uL   Basophils Relative 0 %   Basophils Absolute 0.0 0.0 - 0.1 K/uL  Type and screen [facility]     Status: None   Collection Time: 04/07/16 12:46 PM  Result Value Ref Range   ABO/RH(D) O POS    Antibody Screen NEG    Sample Expiration 04/10/2016     Imaging:  [place] Mfm Ob Comp + 14 Wk  Result Date: 04/07/2016 ----------------------------------------------------------------------  OBSTETRICS REPORT                      (Signed Final 04/07/2016 02:07 pm) ---------------------------------------------------------------------- Patient Info  ID #:       [id number]                          D.O.B.:  [birth year] (30 yrs)  Name:       Ellen Camacho                Visit Date: 04/07/2016 01:51 pm ---------------------------------------------------------------------- Performed By  Performed By:     Eden Lathe BS      Referred By:      MAU Nursing-                    RDMS RVT  MAU/Triage  Attending:        Darlyn Read MD         Location:         [facility] ---------------------------------------------------------------------- Orders   #  Description                                 Code   1  [place] MFM OB COMP + 14 WK                      76805.01  ----------------------------------------------------------------------   #  Ordered By               Order #        Accession #    Episode #   1  Jen Mow          [id number]      [id number]     [id number]  ---------------------------------------------------------------------- Indications   [redacted] weeks gestation of pregnancy                Z3A.37   Encounter for uncertain dates                  Z36.87   Encounter for fetal anatomic survey            Z36.89   Late to prenatal care, third trimester         O09.33  ---------------------------------------------------------------------- OB History  Gravidity:    6         Term:   3        Prem:   1        SAB:   1  TOP:          0       Ectopic:  0        Living: 5 ---------------------------------------------------------------------- Fetal Evaluation  Num Of Fetuses:     1  Fetal Heart         130  Rate(bpm):  Cardiac Activity:   Observed  Presentation:       Cephalic  Placenta:           Posterior Fundal, above cervical os  P. Cord Insertion:  Not well visualized  Amniotic Fluid  AFI FV:      Subjectively within normal limits  AFI Sum(cm)     %Tile       Largest Pocket(cm)  18.56           72          6.58  RUQ(cm)       RLQ(cm)       LUQ(cm)        LLQ(cm)  5.03          2.97          3.98           6.58 ---------------------------------------------------------------------- Biometry  BPD:      91.9  mm     G. Age:  37w 2d         60  %    CI:        73.83   %    70 - 86  FL/HC:      20.4   %    20.9 - 22.7  HC:      339.7  mm     G. Age:  39w 0d         61  %    HC/AC:      0.97        0.92 - 1.05  AC:      349.4  mm     G. Age:  38w  6d         90  %    FL/BPD:     75.4   %    71 - 87  FL:       69.3  mm     G. Age:  35w 4d          8  %    FL/AC:      19.8   %    20 - 24  HUM:      62.3  mm     G. Age:  36w 1d         45  %  Est. FW:    3354  gm      7 lb 6 oz     79  % ---------------------------------------------------------------------- Gestational Age  LMP:           40w 6d        Date:  06/26/15                 EDD:   04/01/16  U/S Today:     37w 5d                                        EDD:   04/23/16  Best:          37w 5d     Det. By:  [redacted] weeks gestation of    EDD:   04/23/16                                      pregnancy ---------------------------------------------------------------------- Anatomy  Cranium:               Appears normal         Aortic Arch:            Appears normal  Cavum:                 Appears normal         Ductal Arch:            Appears normal  Ventricles:            Appears normal         Diaphragm:              Appears normal  Choroid Plexus:        Appears normal         Stomach:                Present but small  Cerebellum:            Appears normal         Abdomen:                Appears normal  Posterior Fossa:  Appears normal         Abdominal Wall:         Appears nml (cord                                                                        insert, abd wall)  Nuchal Fold:           Not applicable (>20    Cord Vessels:           Appears normal ([redacted]                         wks GA)                                        vessel cord)  Face:                  Appears normal         Kidneys:                Appear normal                         (orbits and profile)  Lips:                  Appears normal         Bladder:                Appears normal  Thoracic:              Appears normal         Spine:                  Appears normal  Heart:                 Pericardial            Upper Extremities:      Appears normal                         effusion  RVOT:                  Appears normal         Lower  Extremities:      Appears normal  LVOT:                  Appears normal  Other:  Fetus appears to be a female. Nasal bone visualized. Technically          difficult due to advanced GA and fetal position. ---------------------------------------------------------------------- Cervix Uterus Adnexa  Cervix  Not visualized (advanced GA >29wks)  Uterus  No abnormality visualized.  Left Ovary  Not visualized.  Right Ovary  Within normal limits.  Cul De Sac:   No free fluid seen.  Adnexa:       No abnormality visualized. ---------------------------------------------------------------------- Impression  Single living intrauterine pregnancy at [redacted]w[redacted]d.  Appropriate fetal growth (79%).  Normal amniotic fluid volume.  The fetal anatomic survey is complete.  A pericardial effusion  measuring 5.73mm adjacent the right  ventricle is visualized. The cardiac anatomy otherwise  appears normal. There are no other sonographic findings  suggestive of hydrops.  The stomach is present but appears small.  Otherwise normal fetal anatomy.  No fetal anomalies or soft markers of aneuploidy seen. ---------------------------------------------------------------------- Recommendations  Recommend postnatal echocardiogram for further evaluation.  Pediatrics should be notified at the time of delivery. ----------------------------------------------------------------------                  Darlyn Read, MD Electronically Signed Final Report   04/07/2016 02:07 pm ----------------------------------------------------------------------   MAU Course: Patient presented with worsening abdominal pain consistent with uterine contractions. She has had no prenatal care. Dating ultrasound was performed and was consistent with 37+[redacted] week gestation age. The ultrasound was also notable for pericardial effusion, with recommendation for postnatal echocardiogram and notification of physician evaluating the infant at birth. Initial prenatal labs were obtained. The FHT was  reactive. Cervical exam revealed a cervix that is 1-2 cm, posterior, high with ballotable head. Plan for patient to establish care this week and follow-up closely.   Assessment: 1. Uterine contractions during pregnancy   2. No prenatal care in current pregnancy   3. [redacted] weeks gestation of pregnancy   4. Encounter for fetal anatomic survey   5. Encounter for antenatal screening for uncertain dates   6. Late prenatal care affecting pregnancy in third trimester     Plan: Discharge home Plan to establish care with clinic this week Labor precautions reviewed Follow up with OB provider  Follow-up Information    [facility] Follow up in 1 week(s).   Specialty:  Obstetrics and Gynecology Why:  Please call for appointment if not notified by the early part of next week Contact information: [address] [place] [address] [phone]          Allergies as of 04/07/2016   No Known Allergies     Medication List    STOP taking these medications   ibuprofen 600 MG tablet Commonly known as:  ADVIL,MOTRIN   oxyCODONE-acetaminophen 5-325 MG tablet Commonly known as:  PERCOCET/ROXICET     TAKE these medications   acetaminophen 325 MG tablet Commonly known as:  TYLENOL Take 2 tablets (650 mg total) by mouth every 6 (six) hours as needed (for pain scale < 4).   CONCEPT OB 130-92.4-1 MG Caps Take 1 tablet by mouth daily.       Lise Auer, MD PGY-2 04/07/2016 3:58 PM   OB FELLOW MAU DISCHARGE ATTESTATION  I have seen and examined this patient; I agree with above documentation in the resident's note. Patient came in for a labor check, per her LMP (not definite, approx), she was 40 6/7, however she has not received any prenatal care. Per [place] today, she is 37 5/7 weeks, good fluid levels (patient denied any LOF), reactive NST, and found to only be dilated to 1.5 cm. Patient was comfortable in room during contractions. Discussed to  return when contractions are more painful. Prenatal labs taken today, GBS culture performed, and sent message to office to have patient be seen in office next week for initial prenatal visit if does not go into labor this weekend.   I personally reviewed the patient's NST today, found to be REACTIVE. 135 bpm, mod var, +accels, no decels. CTX: 3 min.   Jen Mow, DO OB Fellow

## 2016-04-07 NOTE — MAU Note (Signed)
Pt presents to MAU stating that she is having contractions that started a week ago. PT states she has not had any PNC. Denies any vaginal bleeding or abnormal discharge

## 2016-04-08 LAB — RPR: RPR Ser Ql: NONREACTIVE

## 2016-04-08 LAB — RUBELLA SCREEN: RUBELLA: 6.54 {index} (ref >0.99)

## 2016-04-08 LAB — HIV ANTIBODY (ROUTINE TESTING W REFLEX): HIV SCREEN 4TH GENERATION: NONREACTIVE

## 2016-04-11 LAB — GC/CHLAMYDIA PROBE AMP (~~LOC~~) NOT AT ARMC
CHLAMYDIA, DNA PROBE: NEGATIVE
NEISSERIA GONORRHEA: NEGATIVE

## 2016-04-17 ENCOUNTER — Ambulatory Visit: Payer: Medicaid Other | Admitting: Family Medicine

## 2016-04-18 NOTE — Progress Notes (Signed)
Pt rescheduled

## 2016-04-20 IMAGING — US US MFM UA CORD DOPPLER
1 series · 15 of 28 positions shown · non-contrast
Comparison: none

[Series 1: us mfm ua cord doppler · 0.25mm/px · 67 acquisitions, 15 frames shown]
[im 1/67]
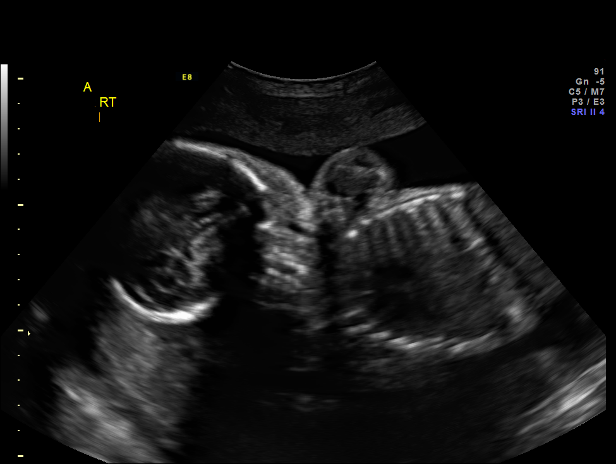
[im 5/67]
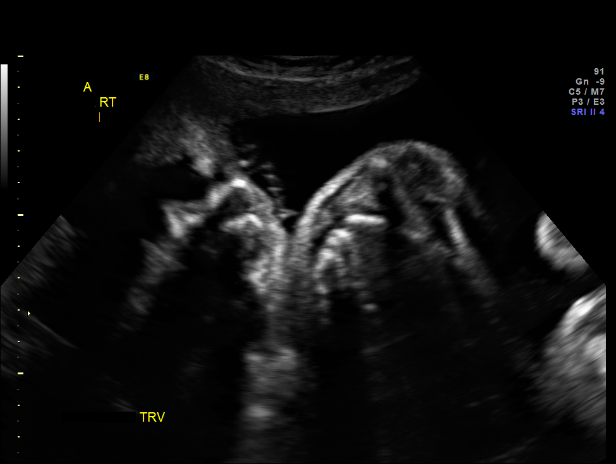
[im 10/67]
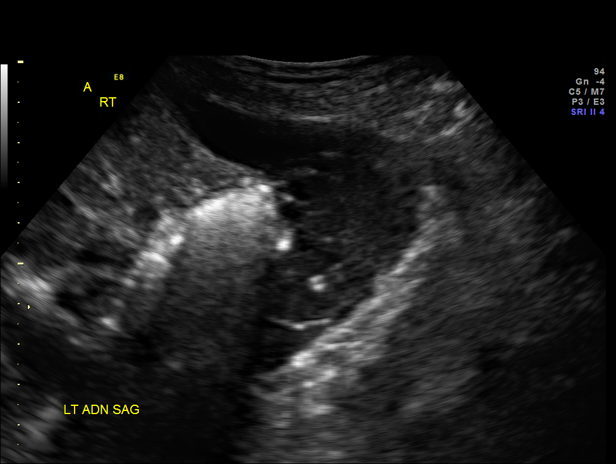
[im 15/67]
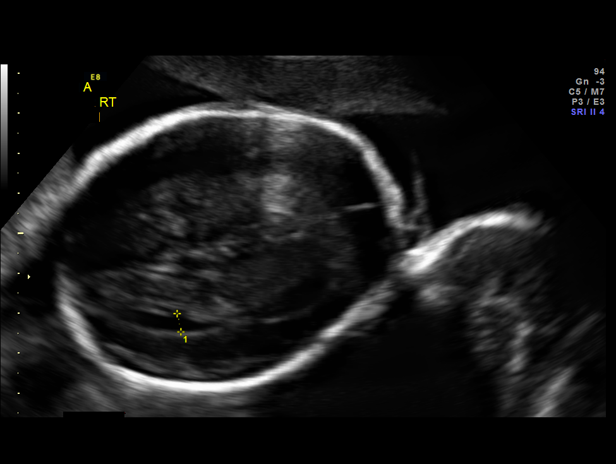
[im 20/67]
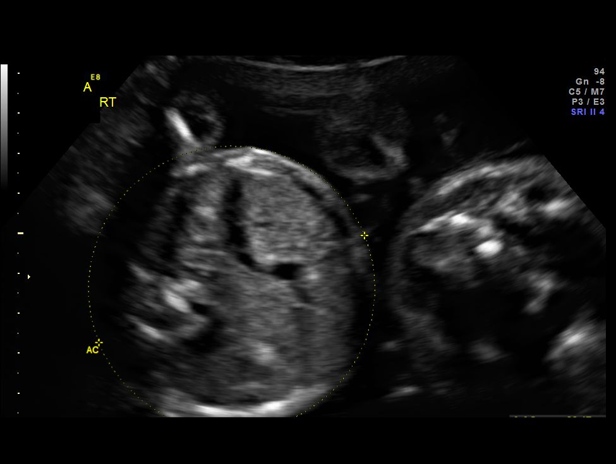
[im 25/67]
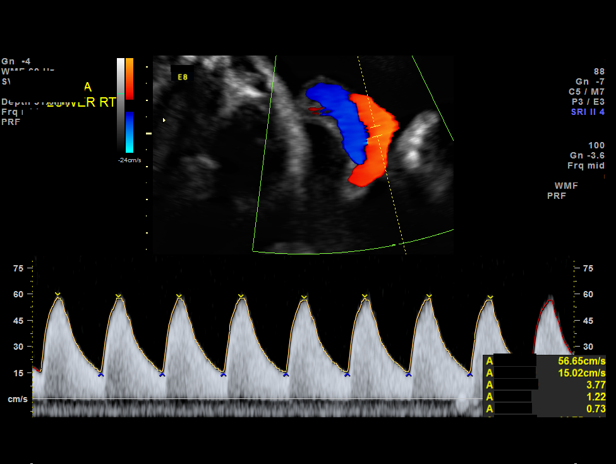
[im 30/67]
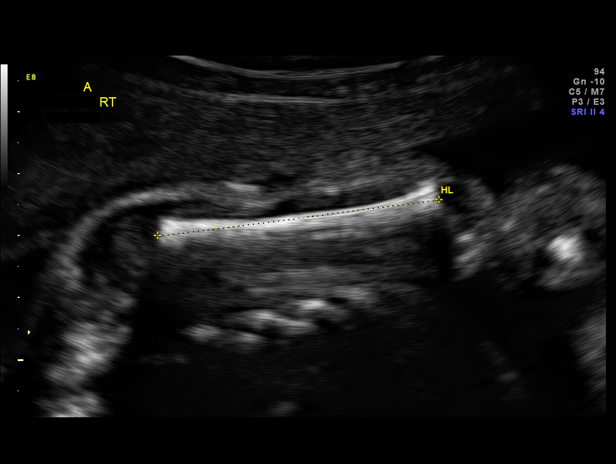
[im 35/67]
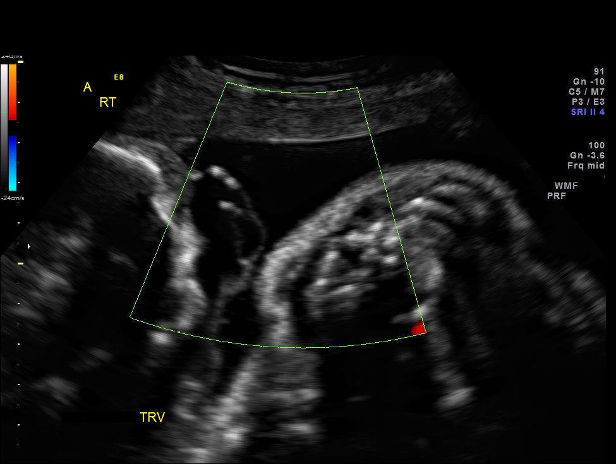
[im 37/67]
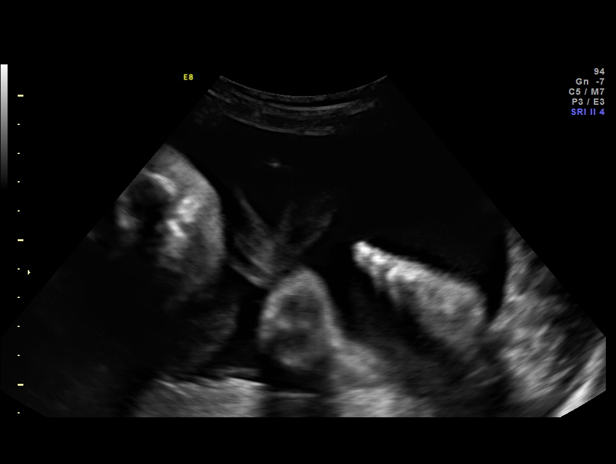
[im 42/67]
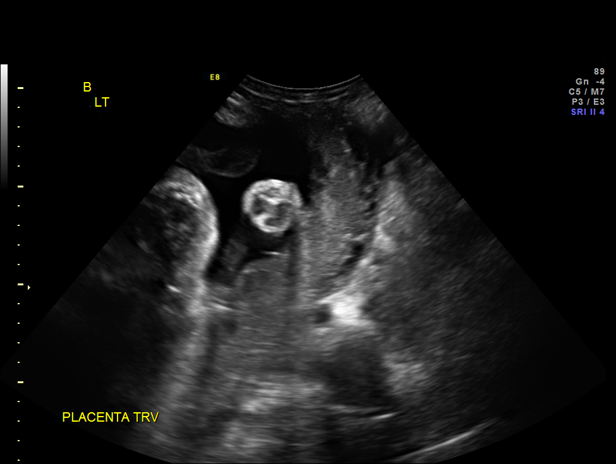
[im 47/67]
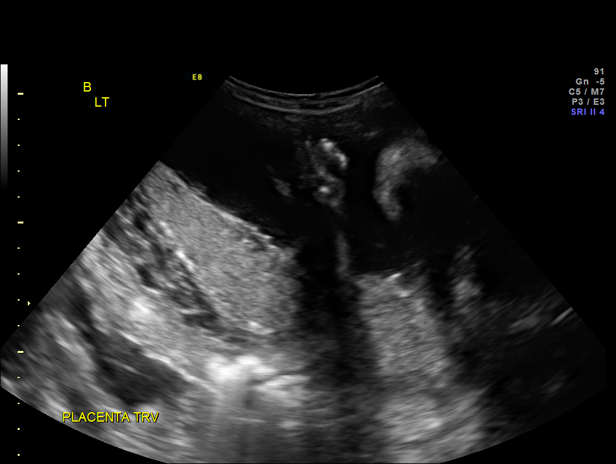
[im 52/67]
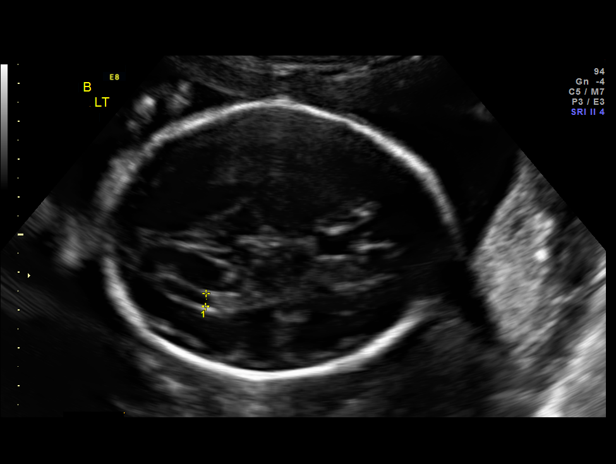
[im 57/67]
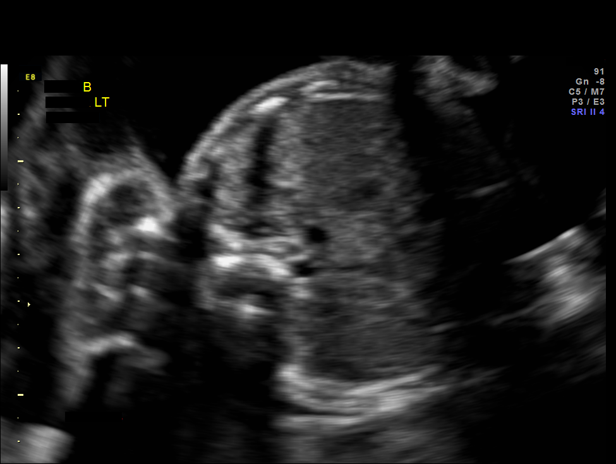
[im 62/67]
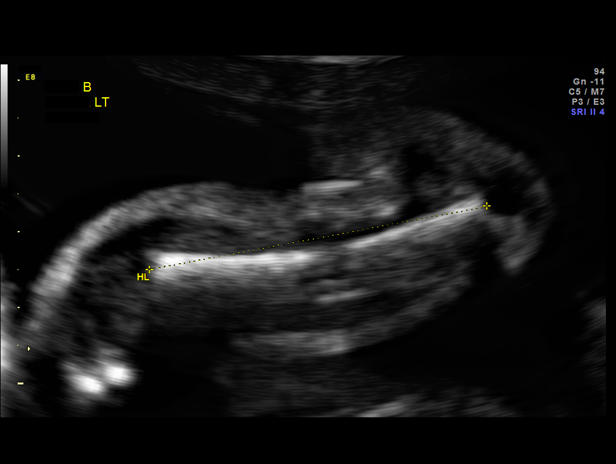
[im 67/67]
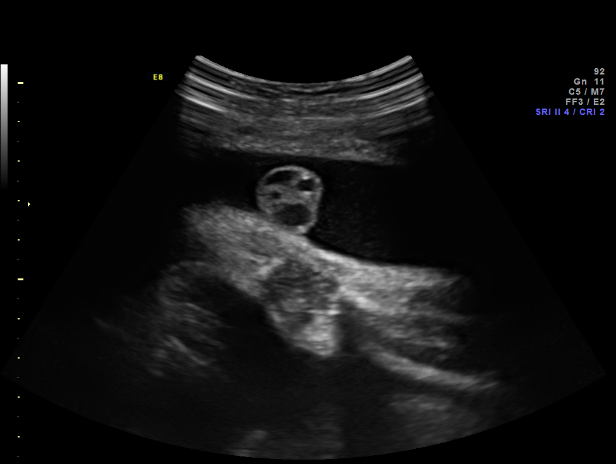

[15 of 28 positions shown; findings below may reference images not displayed]

OBSTETRICS REPORT

Service(s) Provided

US MFM UA CORD DOPPLER                                76820.02
Indications

28 weeks gestation of pregnancy
Twin pregnancy, di/di, second trimester
Maternal care for known or suspected poor fetal
growth, third trimester, fetus 1
No or Little Prenatal Care
Fetal Evaluation (Fetus A)

Num Of Fetuses:    2
Fetal Heart Rate:  144                          bpm
Cardiac Activity:  Observed
Fetal Lie:         Lower Fetus
Presentation:      Breech
Placenta:          Posterior, above cervical
os
P. Cord            Visualized, central
Insertion:

Membrane Desc:     Dividing
Membrane seen
- Dichorionic.

Amniotic Fluid
AFI FV:      Subjectively within normal limits
Larg Pckt:     4.8  cm
Biometry (Fetus A)

BPD:     67.2  mm     G. Age:  27w 1d                CI:         74.1   70 - 86
OFD:     90.7  mm                                    FL/HC:      20.1   18.8 -
20.6
HC:     255.6  mm     G. Age:  27w 5d        8  %    HC/AC:      1.14   1.05 -
1.21
AC:     224.8  mm     G. Age:  26w 6d        8  %    FL/BPD:     76.5   71 - 87
FL:      51.4  mm     G. Age:  27w 3d       13  %    FL/AC:      22.9   20 - 24
HUM:     45.8  mm     G. Age:  27w 0d       16  %

Est. FW:    2949  gm      2 lb 5 oz     26  %     FW Discordancy        14  %
Gestational Age (Fetus A)

LMP:           32w 1d        Date:  05/10/14                 EDD:   02/14/15
U/S Today:     27w 2d                                        EDD:   03/20/15
Best:          28w 3d     Det. By:  Early Ultrasound         EDD:   03/12/15
(07/22/14)
Anatomy (Fetus A)

Cranium:          Appears normal         Aortic Arch:      Previously seen
Fetal Cavum:      Appears normal         Ductal Arch:      Previously seen
Ventricles:       Appears normal         Diaphragm:        Previously seen
Choroid Plexus:   Previously seen        Stomach:          Appears normal, left
sided
Cerebellum:       Previously seen        Abdomen:          Appears normal
Posterior Fossa:  Previously seen        Abdominal Wall:   Previously seen
Nuchal Fold:      Not applicable (>20    Cord Vessels:     Previously seen
wks GA)
Face:             Orbits and profile     Kidneys:          Appear normal
previously seen
Lips:             Previously seen        Bladder:          Appears normal
Heart:            Appears normal         Spine:            Previously seen
(4CH, axis, and
situs)
RVOT:             Previously seen        Lower             Previously seen
Extremities:
LVOT:             Previously seen        Upper             Previously seen
Extremities:

Other:  Female gender previously seen. Heels and 5th digit prev. visualized.
Nasal bone previously visualized. Technically difficult due to
advanced GA and fetal position.
Doppler - Fetal Vessels (Fetus A)

Umbilical Artery
S/D:   3.83           85  %tile       RI:
PI:    1.22                           PSV:       56.65   cm/s
Umbilical Artery
Absent DFV:    No     Reverse DFV:    No

Fetal Evaluation (Fetus B)

Num Of Fetuses:    2
Fetal Heart Rate:  147                          bpm
Cardiac Activity:  Observed
Fetal Lie:         Upper Fetus
Presentation:      Breech
Placenta:          Posterior Fundal, above
cervical os
P. Cord            Visualized, central
Insertion:

Membrane Desc:     Dividing
Membrane seen
- Dichorionic.
Amniotic Fluid
AFI FV:      Subjectively upper-normal
Larg Pckt:     8.4  cm
Biometry (Fetus B)

BPD:       72  mm     G. Age:  28w 6d                CI:         75.4   70 - 86
OFD:     95.5  mm                                    FL/HC:      18.8   18.8 -
20.6
HC:     268.2  mm     G. Age:  29w 2d       43  %    HC/AC:      1.09   1.05 -
1.21
AC:     245.8  mm     G. Age:  28w 5d       55  %    FL/BPD:     70.0   71 - 87
FL:      50.4  mm     G. Age:  27w 0d        8  %    FL/AC:      20.5   20 - 24
HUM:     45.3  mm     G. Age:  26w 6d       10  %

Est. FW:    4854  gm    2 lb 10 oz      48  %     FW Discordancy     0 \ 14 %
Gestational Age (Fetus B)

LMP:           32w 1d        Date:  05/10/14                 EDD:   02/14/15
U/S Today:     28w 3d                                        EDD:   03/12/15
Best:          28w 3d     Det. By:  Early Ultrasound         EDD:   03/12/15
(07/22/14)
Anatomy (Fetus B)

Cranium:          Appears normal         Aortic Arch:      Previously seen
Fetal Cavum:      Appears normal         Ductal Arch:      Previously seen
Ventricles:       Appears normal         Diaphragm:        Previously seen
Choroid Plexus:   Previously seen        Stomach:          Appears normal, left
sided
Cerebellum:       Previously seen        Abdomen:          Appears normal
Posterior Fossa:  Previously seen        Abdominal Wall:   Previously seen
Nuchal Fold:      Not applicable (>20    Cord Vessels:     Previously seen
wks GA)
Face:             Orbits and profile     Kidneys:          Appear normal
previously seen
Lips:             Previously seen        Bladder:          Appears normal
Heart:            Appears normal         Spine:            Previously seen
(4CH, axis, and
situs)
RVOT:             Previously seen        Lower             Previously seen
Extremities:
LVOT:             Previously seen        Upper             Previously seen
Extremities:

Other:  Male gender previously seen. Heels and 5th digit prev. visualized.
Nasal bone previously visualized. Technically difficult due to
advanced GA and fetal position.
Cervix Uterus Adnexa

Cervix:       Not visualized (advanced GA >61wks)
Left Ovary:    Not visualized.
Right Ovary:   Within normal limits.

Adnexa:     No abnormality visualized.
Impression

Dichorionic/diamniotic twin pregnancy at 28+3 weeks
Normal interval anatomy; anatomic survey complete x 2
Normal amniotic fluid volume x 2
Twin A: EFW at the 26th %tile; AC at the 8th %tile
Twin B: EFW at the 48th %tile
Recommendations

Follow-up ultrasound for growth in 3 weeks

## 2016-04-24 ENCOUNTER — Encounter (HOSPITAL_COMMUNITY): Payer: Self-pay

## 2016-04-24 ENCOUNTER — Inpatient Hospital Stay (HOSPITAL_COMMUNITY): Payer: Medicaid Other | Admitting: Anesthesiology

## 2016-04-24 ENCOUNTER — Ambulatory Visit: Payer: Self-pay

## 2016-04-24 ENCOUNTER — Encounter: Payer: Self-pay | Admitting: Obstetrics & Gynecology

## 2016-04-24 ENCOUNTER — Inpatient Hospital Stay (HOSPITAL_COMMUNITY)
Admission: AD | Admit: 2016-04-24 | Discharge: 2016-04-26 | DRG: 775 | Disposition: A | Discharge status: Home / Self Care | Payer: Medicaid Other | Source: Ambulatory Visit | Attending: Obstetrics and Gynecology | Admitting: Obstetrics and Gynecology

## 2016-04-24 ENCOUNTER — Telehealth (HOSPITAL_COMMUNITY): Payer: Self-pay | Admitting: *Deleted

## 2016-04-24 ENCOUNTER — Ambulatory Visit (INDEPENDENT_AMBULATORY_CARE_PROVIDER_SITE_OTHER): Payer: Medicaid Other | Admitting: Obstetrics & Gynecology

## 2016-04-24 ENCOUNTER — Ambulatory Visit (INDEPENDENT_AMBULATORY_CARE_PROVIDER_SITE_OTHER): Payer: Medicaid Other | Admitting: Clinical

## 2016-04-24 VITALS — BP 116/75 | HR 71 | Wt 154.8 lb

## 2016-04-24 DIAGNOSIS — O403XX0 Polyhydramnios, third trimester, not applicable or unspecified: Principal | ICD-10-CM | POA: Diagnosis present

## 2016-04-24 DIAGNOSIS — O99324 Drug use complicating childbirth: Secondary | ICD-10-CM | POA: Diagnosis present

## 2016-04-24 DIAGNOSIS — F1290 Cannabis use, unspecified, uncomplicated: Secondary | ICD-10-CM | POA: Diagnosis present

## 2016-04-24 DIAGNOSIS — O99334 Smoking (tobacco) complicating childbirth: Secondary | ICD-10-CM | POA: Diagnosis present

## 2016-04-24 DIAGNOSIS — F4323 Adjustment disorder with mixed anxiety and depressed mood: Secondary | ICD-10-CM

## 2016-04-24 DIAGNOSIS — F17210 Nicotine dependence, cigarettes, uncomplicated: Secondary | ICD-10-CM | POA: Diagnosis present

## 2016-04-24 DIAGNOSIS — Z833 Family history of diabetes mellitus: Secondary | ICD-10-CM | POA: Diagnosis not present

## 2016-04-24 DIAGNOSIS — O99333 Smoking (tobacco) complicating pregnancy, third trimester: Secondary | ICD-10-CM | POA: Diagnosis not present

## 2016-04-24 DIAGNOSIS — Z823 Family history of stroke: Secondary | ICD-10-CM | POA: Diagnosis not present

## 2016-04-24 DIAGNOSIS — O480 Post-term pregnancy: Secondary | ICD-10-CM

## 2016-04-24 DIAGNOSIS — Z23 Encounter for immunization: Secondary | ICD-10-CM

## 2016-04-24 DIAGNOSIS — O0933 Supervision of pregnancy with insufficient antenatal care, third trimester: Secondary | ICD-10-CM

## 2016-04-24 DIAGNOSIS — Z3689 Encounter for other specified antenatal screening: Secondary | ICD-10-CM | POA: Diagnosis not present

## 2016-04-24 DIAGNOSIS — Z3A40 40 weeks gestation of pregnancy: Secondary | ICD-10-CM | POA: Diagnosis not present

## 2016-04-24 DIAGNOSIS — O0990 Supervision of high risk pregnancy, unspecified, unspecified trimester: Secondary | ICD-10-CM

## 2016-04-24 DIAGNOSIS — O0930 Supervision of pregnancy with insufficient antenatal care, unspecified trimester: Secondary | ICD-10-CM

## 2016-04-24 DIAGNOSIS — Z8249 Family history of ischemic heart disease and other diseases of the circulatory system: Secondary | ICD-10-CM | POA: Diagnosis not present

## 2016-04-24 LAB — RAPID URINE DRUG SCREEN, HOSP PERFORMED
Amphetamines: NOT DETECTED
BENZODIAZEPINES: NOT DETECTED
Barbiturates: NOT DETECTED
COCAINE: NOT DETECTED
Opiates: NOT DETECTED
Tetrahydrocannabinol: POSITIVE — AB

## 2016-04-24 LAB — CBC
HEMATOCRIT: 34.1 % — AB (ref 36.0–46.0)
HEMOGLOBIN: 11.4 g/dL — AB (ref 12.0–15.0)
MCH: 28.6 pg (ref 26.0–34.0)
MCHC: 33.4 g/dL (ref 30.0–36.0)
MCV: 85.7 fL (ref 78.0–100.0)
Platelets: 206 10*3/uL (ref 150–400)
RBC: 3.98 MIL/uL (ref 3.87–5.11)
RDW: 14.1 % (ref 11.5–15.5)
WBC: 15.3 10*3/uL — AB (ref 4.0–10.5)

## 2016-04-24 LAB — POCT URINALYSIS DIP (DEVICE)
Glucose, UA: NEGATIVE mg/dL
Hgb urine dipstick: NEGATIVE
Nitrite: NEGATIVE
Protein, ur: 30 mg/dL — AB
Specific Gravity, Urine: 1.025 (ref 1.005–1.030)
Urobilinogen, UA: 1 mg/dL (ref 0.0–1.0)
pH: 6.5 (ref 5.0–8.0)

## 2016-04-24 LAB — HEMOGLOBIN A1C
Hgb A1c MFr Bld: 5.4 % (ref <5.7)
Mean Plasma Glucose: 108 mg/dL

## 2016-04-24 LAB — TYPE AND SCREEN
ABO/RH(D): O POS
Antibody Screen: NEGATIVE

## 2016-04-24 MED ORDER — LACTATED RINGERS IV SOLN
INTRAVENOUS | Status: DC
  Administered 2016-04-24: 17:00:00 via INTRAVENOUS

## 2016-04-24 MED ORDER — OXYCODONE-ACETAMINOPHEN 5-325 MG PO TABS: 1.0000 | ORAL_TABLET | ORAL | Status: DC | PRN

## 2016-04-24 MED ORDER — LACTATED RINGERS IV SOLN: 500.0000 mL | INTRAVENOUS | Status: DC | PRN

## 2016-04-24 MED ORDER — PHENYLEPHRINE 40 MCG/ML (10ML) SYRINGE FOR IV PUSH (FOR BLOOD PRESSURE SUPPORT)
80.0000 ug | PREFILLED_SYRINGE | INTRAVENOUS | Status: DC | PRN
  Filled 2016-04-24: qty 5

## 2016-04-24 MED ORDER — SODIUM BICARBONATE 8.4 % IV SOLN
INTRAVENOUS | Status: DC | PRN
  Administered 2016-04-24 (×2): 7 mL via EPIDURAL

## 2016-04-24 MED ORDER — EPHEDRINE 5 MG/ML INJ
10.0000 mg | INTRAVENOUS | Status: DC | PRN
  Filled 2016-04-24: qty 4

## 2016-04-24 MED ORDER — FENTANYL 2.5 MCG/ML BUPIVACAINE 1/10 % EPIDURAL INFUSION (WH - ANES)
INTRAMUSCULAR | Status: AC
  Filled 2016-04-24: qty 100

## 2016-04-24 MED ORDER — BUPIVACAINE HCL (PF) 0.25 % IJ SOLN: INTRAMUSCULAR | Status: DC | PRN

## 2016-04-24 MED ORDER — OXYTOCIN BOLUS FROM INFUSION
500.0000 mL | Freq: Once | INTRAVENOUS | Status: AC
  Administered 2016-04-24: 500 mL via INTRAVENOUS

## 2016-04-24 MED ORDER — OXYTOCIN 40 UNITS IN LACTATED RINGERS INFUSION - SIMPLE MED
2.5000 [IU]/h | INTRAVENOUS | Status: DC
  Administered 2016-04-24: 2.5 [IU]/h via INTRAVENOUS

## 2016-04-24 MED ORDER — PHENYLEPHRINE 40 MCG/ML (10ML) SYRINGE FOR IV PUSH (FOR BLOOD PRESSURE SUPPORT)
PREFILLED_SYRINGE | INTRAVENOUS | Status: AC
  Filled 2016-04-24: qty 20

## 2016-04-24 MED ORDER — TERBUTALINE SULFATE 1 MG/ML IJ SOLN
0.2500 mg | Freq: Once | INTRAMUSCULAR | Status: DC | PRN
  Filled 2016-04-24: qty 1

## 2016-04-24 MED ORDER — NALBUPHINE HCL 10 MG/ML IJ SOLN
5.0000 mg | INTRAMUSCULAR | Status: DC | PRN
  Administered 2016-04-24: 5 mg via INTRAVENOUS
  Filled 2016-04-24: qty 1

## 2016-04-24 MED ORDER — OXYTOCIN 40 UNITS IN LACTATED RINGERS INFUSION - SIMPLE MED
1.0000 m[IU]/min | INTRAVENOUS | Status: DC
  Administered 2016-04-24: 2 m[IU]/min via INTRAVENOUS
  Filled 2016-04-24: qty 1000

## 2016-04-24 MED ORDER — DIPHENHYDRAMINE HCL 50 MG/ML IJ SOLN: 12.5000 mg | INTRAMUSCULAR | Status: DC | PRN

## 2016-04-24 MED ORDER — ACETAMINOPHEN 325 MG PO TABS: 650.0000 mg | ORAL_TABLET | ORAL | Status: DC | PRN

## 2016-04-24 MED ORDER — FENTANYL 2.5 MCG/ML BUPIVACAINE 1/10 % EPIDURAL INFUSION (WH - ANES)
14.0000 mL/h | INTRAMUSCULAR | Status: DC | PRN
  Administered 2016-04-24: 14 mL/h via EPIDURAL

## 2016-04-24 MED ORDER — LACTATED RINGERS IV SOLN: 500.0000 mL | Freq: Once | INTRAVENOUS | Status: DC

## 2016-04-24 MED ORDER — LIDOCAINE HCL (PF) 1 % IJ SOLN
30.0000 mL | INTRAMUSCULAR | Status: DC | PRN
  Filled 2016-04-24: qty 30

## 2016-04-24 MED ORDER — SOD CITRATE-CITRIC ACID 500-334 MG/5ML PO SOLN: 30.0000 mL | ORAL | Status: DC | PRN

## 2016-04-24 MED ORDER — ONDANSETRON HCL 4 MG/2ML IJ SOLN: 4.0000 mg | Freq: Four times a day (QID) | INTRAMUSCULAR | Status: DC | PRN

## 2016-04-24 MED ORDER — OXYCODONE-ACETAMINOPHEN 5-325 MG PO TABS: 2.0000 | ORAL_TABLET | ORAL | Status: DC | PRN

## 2016-04-24 NOTE — Progress Notes (Signed)
  Subjective:    Ellen Camacho is a [id number] 7528w1d being seen today for her first obstetrical visit.  Her obstetrical history is significant for late prenatal care. Patient does intend to breast feed. Pregnancy history fully reviewed.  Patient reports no complaints.  Vitals:   04/24/16 0917  BP: 116/75  Pulse: 71  Weight: 154 lb 12.8 oz (70.2 kg)    HISTORY: OB History  Gravida Para Term Preterm AB Living  6 4 3 1 1 5   SAB TAB Ectopic Multiple Live Births  1 0 0 1 5    # Outcome Date GA Lbr Len/2nd Weight Sex Delivery Anes PTL Lv  6 Current           5A Preterm 02/12/15 5262w0d 08:09 / 00:15 4 lb 6.2 oz (1.99 kg) F Vag-Spont EPI  LIV  5B Preterm 02/12/15 3662w0d 08:09 / 00:50 5 lb 0.1 oz (2.27 kg) M Vag-Spont EPI  LIV  4 Term 12/08/05 6069w0d   M    LIV  3 Term 11/25/01 4869w0d   M    LIV  2 Term 12/10/00 5469w0d   F    LIV  1 SAB              Past Medical History:  Diagnosis Date  . Anemia   . Headache   . HSV infection   . Seasonal allergies   . STD (female)    Past Surgical History:  Procedure Laterality Date  . NO PAST SURGERIES     Family History  Problem Relation Age of Onset  . Cancer Mother   . Diabetes Mother   . Hypertension Mother   . Heart disease Mother   . Stroke Brother   . Cancer Maternal Grandfather      Exam    Uterus:     Pelvic Exam:    Perineum: No Hemorrhoids   Vulva: normal   Vagina:  normal mucosa   pH:     Cervix: no lesions   Adnexa: not evaluated   Bony Pelvis: average  System:     Skin: normal coloration and turgor, no rashes    Neurologic: oriented, normal mood   Extremities: normal strength, tone, and muscle mass   HEENT neck supple with midline trachea   Mouth/Teeth mucous membranes moist, pharynx normal without lesions   Neck supple   Cardiovascular: regular rate and rhythm   Respiratory:  appears well, vitals normal, no respiratory distress, acyanotic, normal RR, neck free of mass or lymphadenopathy   Abdomen: soft,  non-tender; bowel sounds normal; no masses,  no organomegaly   Urinary: urethral meatus normal      Assessment:    Pregnancy: [id number] Patient Active Problem List   Diagnosis Date Noted  . 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 Supervision of high risk pregnancy, antepartum 11/12/2014  . Tobacco use in pregnancy, antepartum 11/12/2014  . Limited prenatal care, antepartum 10/31/2014        Plan:     Initial labs drawn. Prenatal vitamins. Problem list reviewed and updated.   Ultrasound discussed; fetal survey: results reviewed. Polyhydramnios, needs IOL  Scheryl DarterJames Arnold 04/24/2016

## 2016-04-24 NOTE — Progress Notes (Signed)
Patient needs to see Asher MuirJamie due [facility] GAD[facility]

## 2016-04-24 NOTE — Anesthesia Pain Management Evaluation Note (Signed)
  CRNA Pain Management Visit Note  Patient: Ellen Camacho, 31 y.o., female  "Hello I am a member of the anesthesia [facility]. We have an anesthesia team available at all times to provide care throughout the hospital, including epidural management and anesthesia for C-section. I don't know your plan for the delivery whether it a natural birth, water birth, IV sedation, nitrous supplementation, doula or epidural, but we want to meet your pain goals."   1.Was your pain managed to your expectations on prior hospitalizations?   Yes   2.What is your expectation for pain management during this hospitalization?     Epidural  3.How can we help you reach that goal? Epidural   Record the patient's initial score and the patient's pain goal.   Pain: 5  Pain Goal: 6 [facility] wants you to be able to say your pain was always managed very well.  Ramata Strothman 04/24/2016

## 2016-04-24 NOTE — Telephone Encounter (Signed)
Preadmission screen  

## 2016-04-24 NOTE — H&P (Signed)
Ellen Camacho is a 31 y.o. female presenting for IOL 2/2 polyhydramnios  OB History    Gravida Para Term Preterm AB Living   6 4 3 1 1 5    SAB TAB Ectopic Multiple Live Births   1 0 0 1 5     Past Medical History:  Diagnosis Date  . Anemia   . Headache   . HSV infection   . Seasonal allergies   . STD (female)    Past Surgical History:  Procedure Laterality Date  . NO PAST SURGERIES     Family History: family history includes Cancer in her maternal grandfather and mother; Diabetes in her mother; Heart disease in her mother; Hypertension in her mother; Stroke in her brother. Social History:  reports that she has been smoking Cigarettes.  She has been smoking about 0.50 packs per day. She has never used smokeless tobacco. She reports that she does not drink alcohol or use drugs.     Maternal Diabetes: No Genetic Screening: Declined Maternal Ultrasounds/Referrals: Declined Fetal Ultrasounds or other Referrals:  None Maternal Substance Abuse:  No Significant Maternal Medications:  None Significant Maternal Lab Results:  None Other Comments:  late/limited prenatal care.   Review of Systems  Constitutional: Negative for chills and fever.  Gastrointestinal: Negative for nausea and vomiting.  Genitourinary: Negative for dysuria and urgency.   Maternal Medical History:  Reason for admission: Nausea.  Prenatal complications: One prenatal visit only  Prenatal Complications - Diabetes: No testing done.   Dilation: 3 Effacement (%): 70 Station: -3 Exam by:: Lauren McDanielr N Blood pressure (!) 123/96, pulse 84, temperature 98.3 F (36.8 C), temperature source Oral, resp. rate 18, height 5\' 6"  (1.676 m), weight 154 lb (69.9 kg), last menstrual period 06/26/2015, unknown if currently breastfeeding. Maternal Exam:  Abdomen: Patient reports no abdominal tenderness. Pelvis: adequate for delivery.      Fetal Exam Fetal Monitor Review: Mode: ultrasound.   Baseline rate: 140.   Variability: moderate (6-25 bpm).   Pattern: accelerations present and no decelerations.    Fetal State Assessment: Category I - tracings are normal.     Physical Exam  Nursing note and vitals reviewed. Constitutional: She is oriented to person, place, and time. She appears well-developed and well-nourished. No distress.  HENT:  Head: Normocephalic.  Cardiovascular: Normal rate.   Respiratory: Effort normal.  GI: Soft. There is no tenderness. There is no rebound.  Neurological: She is alert and oriented to person, place, and time.  Skin: Skin is warm and dry.  Psychiatric: She has a normal mood and affect.    Prenatal labs: ABO, Rh: --/--/O POS (01/26 1246) Antibody: NEG (01/26 1246) Rubella: 6.54 (01/26 1246) RPR: Non Reactive (01/26 1246)  HBsAg: Negative (01/26 1246)  HIV: Non Reactive (01/26 1246)  GBS: Negative (11/30 0000)   Assessment/Plan: [redacted] weeks gestation  Polyhydramnios Admit to labor and delivery for IOL Pitocin 1/1   Tawnya CrookHogan, Heather Donovan 04/24/2016, 4:17 PM

## 2016-04-24 NOTE — Progress Notes (Signed)
Evaluated patient for progress of labor.  Feeling comfortable with epidural placed.  Reported that her water may have broken.  Cervix dilated to 5-6, 70% effaced and -1 station and SROM.  FHR baseline 130s, mod variability, pos accels, variable/late decels and contractions q2-373min.  Anticipate SVD.

## 2016-04-24 NOTE — Anesthesia Preprocedure Evaluation (Signed)
Anesthesia Evaluation  Patient identified by MRN, date of birth, ID band Patient awake    Reviewed: Allergy & Precautions, H&P , NPO status , Patient's Chart, lab work & pertinent test results, reviewed documented beta blocker date and time   Airway Mallampati: I  TM Distance: >3 FB Neck ROM: full    Dental no notable dental hx.    Pulmonary neg pulmonary ROS, Current Smoker,    Pulmonary exam normal breath sounds clear to auscultation       Cardiovascular negative cardio ROS Normal cardiovascular exam Rhythm:regular Rate:Normal     Neuro/Psych negative neurological ROS  negative psych ROS   GI/Hepatic negative GI ROS, Neg liver ROS,   Endo/Other  negative endocrine ROS  Renal/GU negative Renal ROS  negative genitourinary   Musculoskeletal   Abdominal   Peds  Hematology negative hematology ROS (+)   Anesthesia Other Findings   Reproductive/Obstetrics (+) Pregnancy                             Anesthesia Physical Anesthesia Plan  ASA: II  Anesthesia Plan: Epidural   Post-op Pain Management:    Induction:   Airway Management Planned:   Additional Equipment:   Intra-op Plan:   Post-operative Plan:   Informed Consent: I have reviewed the patients History and Physical, chart, labs and discussed the procedure including the risks, benefits and alternatives for the proposed anesthesia with the patient or authorized representative who has indicated his/her understanding and acceptance.     Plan Discussed with:   Anesthesia Plan Comments:         Anesthesia Quick Evaluation  

## 2016-04-24 NOTE — Anesthesia Procedure Notes (Signed)
Epidural Patient location during procedure: OB Start time: 04/24/2016 9:00 PM End time: 04/24/2016 9:15 PM  Staffing Anesthesiologist: Armonii Sieh Performed: anesthesiologist   Preanesthetic Checklist Completed: patient identified, site marked, surgical consent, pre-op evaluation, timeout performed, IV checked, risks and benefits discussed and monitors and equipment checked  Epidural Patient position: sitting Prep: site prepped and draped and DuraPrep Patient monitoring: continuous pulse ox and blood pressure Approach: midline Location: L4-L5 Injection technique: LOR air  Needle:  Needle type: Tuohy  Needle gauge: 17 G Needle length: 9 cm and 9 Needle insertion depth: 4 cm Catheter type: closed end flexible Catheter size: 19 Gauge Catheter at skin depth: 8 cm Test dose: negative  Assessment Sensory level: T10 Events: blood not aspirated, injection not painful, no injection resistance, negative IV test and no paresthesia

## 2016-04-24 NOTE — BH Specialist Note (Signed)
Session Start time: 10:00  End Time: 10:18 Total Time:  18 minutes Type of Service: Behavioral Health - Individual/Family Interpreter: No.   Interpreter Name & Language: n/a # [facility]-June [facility]  SUBJECTIVE: Ellen Camacho is a 31 y.o. female  Pt. was referred by Dr Debroah LoopArnold for:  anxiety and depression. Pt. reports the following symptoms/concerns: Pt states her primary concern today is "having the baby". Pt says she was previously diagnosed with bipolar disorder, [facility]; would rather "just deal with it" than go back.  Duration of problem:  Undetermined Severity: moderate Previous treatment: [facility] (Abilify, Latuda)  OBJECTIVE: Mood: Appropriate & Affect: Appropriate Risk of harm to self or others: No known risk of harm to self or others Assessments administered: PHQ9: 10/ GAD7: 13  LIFE CONTEXT:  Family & Social: Lives with 5 children(15yo, 14yo, 10yo, twin 1yo's); 15yo very supportive School/ Work: undetermined  Self-Care: undetermined  Life changes: Current pregnancy What is important to pt/family (values): Family   GOALS ADDRESSED:  -Reduce symptoms of anxiety and depression -Increase social support  INTERVENTIONS: Motivational Interviewing   ASSESSMENT:  Pt currently experiencing Adjustment disorder with mixed anxious and depressed mood(possibly bipolar affective disorder, would need further assessment).  Pt may benefit from psychoeducation and brief therapeutic intervention regarding coping with symptoms of anxiety and depression.   PLAN: 1. F/U with behavioral health clinician: Six weeks, or earlier, as needed 2. Behavioral Health meds: none 3. Behavioral recommendations:  -Consider [facility] -Consider Mom Talk support group postpartum Tuesdays 10am, [facility] -Read educational material regarding coping with symptoms of anxiety and  depression 4. Referral: Brief Counseling/Psychotherapy, Publishing rights managerCommunity Resource and Psychoeducation 5. From scale of 1-10, how likely are you to follow plan: 7  [facility]  Behavioral Health Clinician  Marlon PelWarmhandoff:   Warm Hand Off Completed.        Depression screen [facility] 2/9 04/24/2016 02/10/2015 02/02/2015 01/27/2015 01/19/2015  Decreased Interest 1 0 0 0 0  Down, Depressed, Hopeless 3 0 0 0 0  PHQ - 2 Score 4 0 0 0 0  Altered sleeping 0 - - - -  Tired, decreased energy 3 - - - -  Change in appetite 3 - - - -  Feeling bad or failure about yourself  0 - - - -  Trouble concentrating 0 - - - -  Moving slowly or fidgety/restless 0 - - - -  Suicidal thoughts 0 - - - -  PHQ-9 Score 10 - - - -   GAD 7 : Generalized Anxiety Score 04/24/2016  Nervous, Anxious, on Edge 0  Control/stop worrying 1  Worry too much - different things 3  Trouble relaxing 3  Restless 3  Easily annoyed or irritable 3  Afraid - awful might happen 0  Total GAD 7 Score 13

## 2016-04-25 ENCOUNTER — Encounter (HOSPITAL_COMMUNITY): Payer: Self-pay

## 2016-04-25 LAB — PRENATAL PROFILE (SOLSTAS)
Antibody Screen: NEGATIVE
BASOS PCT: 0 %
Basophils Absolute: 0 cells/uL (ref 0–200)
EOS ABS: 0 {cells}/uL — AB (ref 15–500)
Eosinophils Relative: 0 %
HCT: 34.6 % — ABNORMAL LOW (ref 35.0–45.0)
HIV 1&2 Ab, 4th Generation: NONREACTIVE
Hemoglobin: 11.5 g/dL — ABNORMAL LOW (ref 11.7–15.5)
Hepatitis B Surface Ag: NEGATIVE
Lymphocytes Relative: 24 %
Lymphs Abs: 2472 cells/uL (ref 850–3900)
MCH: 29.1 pg (ref 27.0–33.0)
MCHC: 33.2 g/dL (ref 32.0–36.0)
MCV: 87.6 fL (ref 80.0–100.0)
MONO ABS: 1339 {cells}/uL — AB (ref 200–950)
MPV: 11.5 fL (ref 7.5–12.5)
Monocytes Relative: 13 %
NEUTROS ABS: 6489 {cells}/uL (ref 1500–7800)
Neutrophils Relative %: 63 %
PLATELETS: 205 10*3/uL (ref 140–400)
RBC: 3.95 MIL/uL (ref 3.80–5.10)
RDW: 14.8 % (ref 11.0–15.0)
Rh Type: POSITIVE
Rubella: 5.37 Index — ABNORMAL HIGH (ref <0.90)
WBC: 10.3 10*3/uL (ref 3.8–10.8)

## 2016-04-25 LAB — RPR: RPR Ser Ql: NONREACTIVE

## 2016-04-25 MED ORDER — WITCH HAZEL-GLYCERIN EX PADS: 1.0000 "application " | MEDICATED_PAD | CUTANEOUS | Status: DC | PRN

## 2016-04-25 MED ORDER — SENNOSIDES-DOCUSATE SODIUM 8.6-50 MG PO TABS
2.0000 | ORAL_TABLET | ORAL | Status: DC
  Administered 2016-04-25: 2 via ORAL
  Filled 2016-04-25 (×2): qty 2

## 2016-04-25 MED ORDER — DIBUCAINE 1 % RE OINT: 1.0000 "application " | TOPICAL_OINTMENT | RECTAL | Status: DC | PRN

## 2016-04-25 MED ORDER — BENZOCAINE-MENTHOL 20-0.5 % EX AERO
1.0000 "application " | INHALATION_SPRAY | CUTANEOUS | Status: DC | PRN
  Administered 2016-04-25: 1 via TOPICAL
  Filled 2016-04-25: qty 56

## 2016-04-25 MED ORDER — ONDANSETRON HCL 4 MG/2ML IJ SOLN: 4.0000 mg | INTRAMUSCULAR | Status: DC | PRN

## 2016-04-25 MED ORDER — PNEUMOCOCCAL VAC POLYVALENT 25 MCG/0.5ML IJ INJ
0.5000 mL | INJECTION | INTRAMUSCULAR | Status: AC
Start: 2016-04-26 — End: 2016-04-26
  Administered 2016-04-26: 0.5 mL via INTRAMUSCULAR
  Filled 2016-04-25: qty 0.5

## 2016-04-25 MED ORDER — TETANUS-DIPHTH-ACELL PERTUSSIS 5-2.5-18.5 LF-MCG/0.5 IM SUSP: 0.5000 mL | Freq: Once | INTRAMUSCULAR | Status: DC

## 2016-04-25 MED ORDER — DIPHENHYDRAMINE HCL 25 MG PO CAPS: 25.0000 mg | ORAL_CAPSULE | Freq: Four times a day (QID) | ORAL | Status: DC | PRN

## 2016-04-25 MED ORDER — ZOLPIDEM TARTRATE 5 MG PO TABS: 5.0000 mg | ORAL_TABLET | Freq: Every evening | ORAL | Status: DC | PRN

## 2016-04-25 MED ORDER — COCONUT OIL OIL: 1.0000 "application " | TOPICAL_OIL | Status: DC | PRN

## 2016-04-25 MED ORDER — ONDANSETRON HCL 4 MG PO TABS: 4.0000 mg | ORAL_TABLET | ORAL | Status: DC | PRN

## 2016-04-25 MED ORDER — SIMETHICONE 80 MG PO CHEW: 80.0000 mg | CHEWABLE_TABLET | ORAL | Status: DC | PRN

## 2016-04-25 MED ORDER — IBUPROFEN 600 MG PO TABS
600.0000 mg | ORAL_TABLET | Freq: Four times a day (QID) | ORAL | Status: DC
  Administered 2016-04-25 – 2016-04-26 (×7): 600 mg via ORAL
  Filled 2016-04-25 (×7): qty 1

## 2016-04-25 MED ORDER — ACETAMINOPHEN 325 MG PO TABS
650.0000 mg | ORAL_TABLET | ORAL | Status: DC | PRN
  Administered 2016-04-25 – 2016-04-26 (×3): 650 mg via ORAL
  Filled 2016-04-25 (×3): qty 2

## 2016-04-25 MED ORDER — PRENATAL MULTIVITAMIN CH
1.0000 | ORAL_TABLET | Freq: Every day | ORAL | Status: DC
  Administered 2016-04-25 – 2016-04-26 (×2): 1 via ORAL
  Filled 2016-04-25 (×2): qty 1

## 2016-04-25 NOTE — Progress Notes (Signed)
UR chart review completed.  

## 2016-04-25 NOTE — Anesthesia Postprocedure Evaluation (Signed)
Anesthesia Post Note  Patient: Ellen Camacho  Procedure(s) Performed: * No procedures listed *  Patient location during evaluation: Mother Baby Anesthesia Type: Epidural Level of consciousness: awake Pain management: pain level controlled Vital Signs Assessment: post-procedure vital signs reviewed and stable Respiratory status: spontaneous breathing Cardiovascular status: stable Postop Assessment: no headache, no backache, epidural receding and patient able to bend at knees        Last Vitals:  Vitals:   04/25/16 0100 04/25/16 0510  BP: 120/84 118/72  Pulse: 69 71  Resp: 18 18  Temp: 36.7 C 36.6 C    Last Pain:  Vitals:   04/25/16 0516  TempSrc:   PainSc: 6    Pain Goal: Patients Stated Pain Goal:  (pt doesnt want anythign for pain at this time) (04/24/16 1700)               Edison PaceWILKERSON,Arjan Strohm
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
## 2016-04-25 NOTE — Progress Notes (Signed)
POSTPARTUM PROGRESS NOTE  Post Partum Day 1 Subjective:  Sharmon Leydendreaka N Wescott is a 31 y.o. [id number] 745w1d s/p SVD.  No acute events overnight.  Pt denies problems with ambulating, voiding or po intake.  She denies nausea or vomiting.  Pain is well controlled.  She has had flatus. She has not had bowel movement.  Lochia Minimal.   Objective: Blood pressure 118/72, pulse 71, temperature 97.8 F (36.6 C), temperature source Oral, resp. rate 18, height 5\' 6"  (1.676 m), weight 69.9 kg (154 lb), last menstrual period 06/26/2015, SpO2 100 %, unknown if currently breastfeeding.  Physical Exam:  General: alert, cooperative and no distress Lochia:normal flow Chest: CTAB Heart: RRR no m/r/g Abdomen: +BS, soft, nontender,  Uterine Fundus: firm DVT Evaluation: No calf swelling or tenderness Extremities: no edema   Recent Labs  04/24/16 1023 04/24/16 1430  HGB 11.5* 11.4*  HCT 34.6* 34.1*    Assessment/Plan:  ASSESSMENT: Sharmon Leydendreaka N Beyl is a 31 y.o. [id number] 535w1d s/p SVD. Late evening delivery.  Patient is feeling well and will attempt to have breakfast this morning.   Plan for discharge tomorrow   LOS: 1 day   Renne Muscaaniel L Ajiah Mcglinn, MD PGY-1 [facility]  04/25/2016, 9:23 AM

## 2016-04-26 DIAGNOSIS — O99333 Smoking (tobacco) complicating pregnancy, third trimester: Secondary | ICD-10-CM

## 2016-04-26 DIAGNOSIS — O403XX0 Polyhydramnios, third trimester, not applicable or unspecified: Secondary | ICD-10-CM

## 2016-04-26 DIAGNOSIS — Z3A40 40 weeks gestation of pregnancy: Secondary | ICD-10-CM

## 2016-04-26 LAB — CULTURE, OB URINE
COLONY COUNT: NO GROWTH
ORGANISM ID, BACTERIA: NO GROWTH

## 2016-04-26 MED ORDER — DOCUSATE SODIUM 100 MG PO CAPS: 100.0000 mg | ORAL_CAPSULE | Freq: Two times a day (BID) | ORAL | 0 refills | Status: DC

## 2016-04-26 MED ORDER — IBUPROFEN 600 MG PO TABS: 600.0000 mg | ORAL_TABLET | Freq: Four times a day (QID) | ORAL | 0 refills | Status: DC

## 2016-04-26 NOTE — Discharge Summary (Signed)
OB Discharge Summary     Patient Name: Ellen Camacho DOB: 09-Nov-1985 MRN: [id number]  Date of admission: 04/24/2016 Delivering MD: Renne MuscaWARDEN, DANIEL L   Date of discharge: 04/26/2016  Admitting diagnosis: INDUCTION Intrauterine pregnancy: 436w1d     Secondary diagnosis:  Active Problems:   Polyhydramnios affecting pregnancy in third trimester  Additional problems: SW c/s for no prenatal care. CPS report placed for maternal marijuana use during pregnancy.      Discharge diagnosis: IOL for polyhydramnios                                                                                                Post partum procedures:none  Augmentation: Pitocin  Complications: None  Hospital course:  Induction of Labor With Vaginal Delivery   31 y.o. yo [id number] at 4136w1d was admitted to the hospital 04/24/2016 for induction of labor.  Indication for induction: polyhydramnios.  Patient had an uncomplicated labor course as follows: Membrane Rupture Time/Date: 9:32 PM ,04/24/2016   Intrapartum Procedures: Episiotomy: None [1]                                         Lacerations:  Labial [10]  Patient had delivery of a Viable infant.  Information for the patient's newborn:  Ellen Camacho, Boy [name] [[id number]][[id number]]  Delivery Method: Vaginal, Spontaneous Delivery (Filed from Delivery Summary)   04/24/2016  Details of delivery can be found in separate delivery note.  Patient had a routine postpartum course. Patient is discharged home 04/26/16.  Physical exam  Vitals:   04/25/16 0510 04/25/16 1200 04/25/16 1756 04/26/16 0522  BP: 118/72 108/78 111/76 116/81  Pulse: 71 81 63 72  Resp: 18 16 18 18   Temp: 97.8 F (36.6 C) 98.1 F (36.7 C) 98 F (36.7 C) 98.1 F (36.7 C)  TempSrc: Oral Oral Oral Oral  SpO2: 100%     Weight:      Height:       General: alert and cooperative Lochia: appropriate Uterine Fundus: firm Incision: N/A DVT Evaluation: No evidence of DVT seen on physical exam. Labs: Lab  Results  Component Value Date   WBC 15.3 (H) 04/24/2016   HGB 11.4 (L) 04/24/2016   HCT 34.1 (L) 04/24/2016   MCV 85.7 04/24/2016   PLT 206 04/24/2016   CMP Latest Ref Rng & Units 07/31/2014  Glucose 65 - 99 mg/dL 88  BUN 6 - 20 mg/dL 11  Creatinine 1.300.44 - 8.651.00 mg/dL 7.840.65  Sodium 696135 - 295145 mmol/L 132(L)  Potassium 3.5 - 5.1 mmol/L 3.6  Chloride 101 - 111 mmol/L 99(L)  CO2 22 - 32 mmol/L 24  Calcium 8.9 - 10.3 mg/dL 9.1  Total Protein 6.5 - 8.1 g/dL 7.7  Total Bilirubin 0.3 - 1.2 mg/dL 2.8(U0.2(L)  Alkaline Phos 38 - 126 U/L 47  AST 15 - 41 U/L 14(L)  ALT 14 - 54 U/L 13(L)    Discharge instruction: per After Visit Summary and "Baby and Me Booklet".  After visit meds:  Allergies as of 04/26/2016   No Known Allergies     Medication List    TAKE these medications   docusate sodium 100 MG capsule Commonly known as:  COLACE Take 1 capsule (100 mg total) by mouth 2 (two) times daily.   ibuprofen 600 MG tablet Commonly known as:  ADVIL,MOTRIN Take 1 tablet (600 mg total) by mouth every 6 (six) hours.       Diet: routine diet  Activity: Advance as tolerated. Pelvic rest for 6 weeks.   Outpatient follow up:6 weeks Follow up Appt:Future Appointments Date Time Provider Department Center  04/30/2016 8:00 AM [facility]-BSS[facility]ED ROOM [facility] None   Follow up Visit:No Follow-up on file.  Postpartum contraception: Nexplanon  Newborn Data: Live born female  Birth Weight: 7 lb 6 oz (3345 g) APGAR: 8, 9  Baby Feeding: Breast Disposition:home with mother   04/26/2016 Renne Musca, MD  OB FELLOW DISCHARGE ATTESTATION  I have seen and examined this patient and agree with above documentation in the resident's note.   Jen Mow, DO OB Fellow 9:54 AM

## 2016-04-26 NOTE — Discharge Instructions (Signed)

## 2016-04-27 ENCOUNTER — Other Ambulatory Visit: Payer: Self-pay | Admitting: Student

## 2016-04-27 LAB — PAIN MGMT, PROFILE 6 CONF W/O MM, U
6 Acetylmorphine: NEGATIVE ng/mL (ref <10)
ALCOHOL METABOLITES: NEGATIVE ng/mL (ref <500)
Amphetamines: NEGATIVE ng/mL (ref <500)
Barbiturates: NEGATIVE ng/mL (ref <300)
Benzodiazepines: NEGATIVE ng/mL (ref <100)
Cocaine Metabolite: NEGATIVE ng/mL (ref <150)
Creatinine: 264.7 mg/dL (ref >=20.0)
MARIJUANA METABOLITE: 1078 ng/mL — AB (ref <5)
MARIJUANA METABOLITE: POSITIVE ng/mL — AB (ref <20)
Methadone Metabolite: NEGATIVE ng/mL (ref <100)
OPIATES: NEGATIVE ng/mL (ref <100)
OXIDANT: NEGATIVE ug/mL (ref <200)
OXYCODONE: NEGATIVE ng/mL (ref <100)
PLEASE NOTE: 0
Phencyclidine: NEGATIVE ng/mL (ref <25)
pH: 6.52 (ref 4.5–9.0)

## 2016-04-27 LAB — CULTURE, BETA STREP (GROUP B ONLY)

## 2016-04-30 ENCOUNTER — Inpatient Hospital Stay (HOSPITAL_COMMUNITY): Admission: RE | Admit: 2016-04-30 | Payer: Medicaid Other | Source: Ambulatory Visit

## 2016-06-07 ENCOUNTER — Ambulatory Visit: Payer: Self-pay | Admitting: Advanced Practice Midwife

## 2017-05-18 ENCOUNTER — Encounter: Payer: Self-pay | Admitting: *Deleted

## 2018-03-21 ENCOUNTER — Other Ambulatory Visit: Payer: Self-pay

## 2018-03-21 ENCOUNTER — Encounter (HOSPITAL_COMMUNITY): Payer: Self-pay | Admitting: Family Medicine

## 2018-03-21 ENCOUNTER — Ambulatory Visit (HOSPITAL_COMMUNITY)
Admission: EM | Admit: 2018-03-21 | Discharge: 2018-03-21 | Disposition: A | Discharge status: Home / Self Care | Payer: Medicaid Other | Attending: Family Medicine | Admitting: Family Medicine

## 2018-03-21 ENCOUNTER — Ambulatory Visit (INDEPENDENT_AMBULATORY_CARE_PROVIDER_SITE_OTHER): Payer: Medicaid Other

## 2018-03-21 DIAGNOSIS — S61219A Laceration without foreign body of unspecified finger without damage to nail, initial encounter: Secondary | ICD-10-CM | POA: Diagnosis not present

## 2018-03-21 DIAGNOSIS — S6991XA Unspecified injury of right wrist, hand and finger(s), initial encounter: Secondary | ICD-10-CM | POA: Diagnosis not present

## 2018-03-21 DIAGNOSIS — W25XXXA Contact with sharp glass, initial encounter: Secondary | ICD-10-CM

## 2018-03-21 DIAGNOSIS — S61216A Laceration without foreign body of right little finger without damage to nail, initial encounter: Secondary | ICD-10-CM | POA: Diagnosis not present

## 2018-03-21 MED ORDER — LIDOCAINE-EPINEPHRINE-TETRACAINE (LET) SOLUTION
NASAL | Status: AC
  Filled 2018-03-21: qty 3

## 2018-03-21 MED ORDER — LIDOCAINE-EPINEPHRINE-TETRACAINE (LET) SOLUTION
3.0000 mL | Freq: Once | NASAL | Status: AC
  Administered 2018-03-21: 3 mL via TOPICAL

## 2018-03-21 NOTE — ED Notes (Signed)
Spoke with provider, LET to be applied post xray

## 2018-03-21 NOTE — Discharge Instructions (Addendum)
We put 3 stitches in your hand Return in approximately 7 days to have the stitches removed Keep the area clean and dry Watch for signs of infection to include increased pain, swelling, drainage, fevers Follow up as needed for continued or worsening symptoms

## 2018-03-21 NOTE — ED Triage Notes (Signed)
Seen by provider prior to nursing staff 

## 2018-03-21 NOTE — ED Notes (Signed)
Patient brought back to triage prior to being registered to clean blood from hand and bandage.

## 2018-03-21 NOTE — ED Provider Notes (Signed)
[facility]    CSN: [id number] Arrival date & time: 03/21/18  0809     History   Chief Complaint Chief Complaint  Patient presents with  . Laceration    HPI Ellen Camacho is a 33 y.o. female.   Patient is a 33 year old female that presents with right hand injury.  This occurred this morning when she cut her hand on some glass .  Since the area has been bleeding.  No significant swelling.  She has good range of motion.  No numbness, tingling, loss of sensation. No deformities to the hand. Laceration to the right 4th just above  the MIP joint.   ROS per HPI      Past Medical History:  Diagnosis Date  . Anemia   . Headache   . HSV infection   . Seasonal allergies   . STD (female)     Patient Active Problem List   Diagnosis Date Noted  . Polyhydramnios affecting pregnancy in third trimester 04/24/2016  . 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 Supervision of high risk pregnancy, antepartum 11/12/2014  . Tobacco use in pregnancy, antepartum 11/12/2014  . Limited prenatal care, antepartum 10/31/2014    Past Surgical History:  Procedure Laterality Date  . NO PAST SURGERIES      OB History    Gravida  6   Para  5   Term  4   Preterm  1   AB  1   Living  6     SAB  1   TAB  0   Ectopic  0   Multiple  1   Live Births  6            Home Medications    Prior to Admission medications   Medication Sig Start Date End Date Taking? Authorizing Provider  docusate sodium (COLACE) 100 MG capsule Take 1 capsule (100 mg total) by mouth 2 (two) times daily. 04/26/16   Renne MuscaWarden, Daniel L, MD  ibuprofen (ADVIL,MOTRIN) 600 MG tablet Take 1 tablet (600 mg total) by mouth every 6 (six) hours. 04/26/16   Renne MuscaWarden, Daniel L, MD    Family History Family History  Problem Relation Age of Onset  . Cancer Mother   . Diabetes Mother   . Hypertension Mother   . Heart disease Mother   . Stroke Brother   . Cancer Maternal Grandfather      Social History Social History   Tobacco Use  . Smoking status: Current Some Day Smoker    Packs/day: 0.50    Types: Cigarettes    Last attempt to quit: 07/21/2014    Years since quitting: 3.6  . Smokeless tobacco: Never Used  Substance Use Topics  . Alcohol use: No  . Drug use: No     Allergies   Patient has no known allergies.   Review of Systems Review of Systems   Physical Exam Triage Vital Signs ED Triage Vitals [03/21/18 0845]  Enc Vitals Group     BP (!) 142/79     Pulse Rate 91     Resp 16     Temp 98.1 F (36.7 C)     Temp Source Oral     SpO2 97 %     Weight      Height      Head Circumference      Peak Flow      Pain Score      Pain Loc  Pain Edu?      Excl. in GC?    No data found.  Updated Vital Signs BP (!) 142/79 (BP Location: Right Arm)   Pulse 91   Temp 98.1 F (36.7 C) (Oral)   Resp 16   LMP 03/08/2018 (Exact Date)   SpO2 97%   Visual Acuity Right Eye Distance:   Left Eye Distance:   Bilateral Distance:    Right Eye Near:   Left Eye Near:    Bilateral Near:     Physical Exam Vitals signs and nursing note reviewed.  Constitutional:      Appearance: Normal appearance.  HENT:     Head: Normocephalic and atraumatic.     Nose: Nose normal.  Eyes:     Conjunctiva/sclera: Conjunctivae normal.  Neck:     Musculoskeletal: Normal range of motion.  Pulmonary:     Effort: Pulmonary effort is normal.  Musculoskeletal: Normal range of motion.        General: Signs of injury present. No swelling, tenderness or deformity.     Comments: Good flexion and extension all digits to the right hand. Sensation intact.  Skin:    General: Skin is warm and dry.     Comments: Approximated 1 inch laceration to the right fourth finger just above the MIP joint.  Neurological:     Mental Status: She is alert.  Psychiatric:        Mood and Affect: Mood normal.        Behavior: Behavior normal.      UC Treatments / Results   Labs (all labs ordered are listed, but only abnormal results are displayed) Labs Reviewed - No data to display  EKG None  Radiology Dg Hand Complete Right  Result Date: 03/21/2018 CLINICAL DATA:  Right hand injury.  Bleeding. EXAM: RIGHT HAND - COMPLETE 3+ VIEW COMPARISON:  Forearm series 09/24/2012. FINDINGS: No acute bony or joint abnormality identified. No evidence of fracture or dislocation. IMPRESSION: No acute abnormality identified. No evidence of fracture or dislocation. Electronically Signed   By: Maisie Fus  Register   On: 03/21/2018 09:41    Procedures Laceration Repair Date/Time: 03/21/2018 10:37 AM Performed by: Janace Aris, NP Authorized by: Elvina Sidle, MD   Consent:    Consent obtained:  Verbal   Consent given by:  Patient   Risks discussed:  Infection, pain, need for additional repair and poor cosmetic result   Alternatives discussed:  No treatment Anesthesia (see MAR for exact dosages):    Anesthesia method:  Topical application Laceration details:    Location:  Finger   Finger location:  R ring finger Repair type:    Repair type:  Simple Pre-procedure details:    Preparation:  Patient was prepped and draped in usual sterile fashion Exploration:    Hemostasis achieved with:  LET   Wound exploration: wound explored through full range of motion     Contaminated: no   Treatment:    Area cleansed with:  Soap and water   Amount of cleaning:  Standard   Irrigation solution:  Sterile saline   Irrigation method:  Pressure wash   Visualized foreign bodies/material removed: no   Skin repair:    Repair method:  Sutures   Suture size:  5-0   Suture material:  Prolene Approximation:    Approximation:  Close Post-procedure details:    Dressing:  Open (no dressing)   Patient tolerance of procedure:  Tolerated well, no immediate complications   (including critical care  time)  Medications Ordered in UC Medications  lidocaine-EPINEPHrine-tetracaine (LET)  solution (3 mLs Topical Given 03/21/18 0942)    Initial Impression / Assessment and Plan / UC Course  I have reviewed the triage vital signs and the nursing notes.  Pertinent labs & imaging results that were available during my care of the patient were reviewed by me and considered in my medical decision making (see chart for details).     X-ray negative for any abnormalities 3 stitches placed in wound for closure Instructed to follow-up in approximate 7 days for stitch removal Keep area clean and dry Patient reported her tetanus is up-to-date Follow up as needed for continued or worsening symptoms  Final Clinical Impressions(s) / UC Diagnoses   Final diagnoses:  Injury of right hand, initial encounter  Laceration of finger of right hand without foreign body without damage to nail, unspecified finger, initial encounter     Discharge Instructions     We put 3 stitches in your hand Return in approximately 7 days to have the stitches removed Keep the area clean and dry Watch for signs of infection to include increased pain, swelling, drainage, fevers Follow up as needed for continued or worsening symptoms     ED Prescriptions    None     Controlled Substance Prescriptions [place] Controlled Substance Registry consulted? Not Applicable   Janace ArisBast, Kenyen Candy A, NP 03/21/18 1039

## 2018-10-03 ENCOUNTER — Encounter (HOSPITAL_COMMUNITY): Payer: Self-pay | Admitting: Urgent Care

## 2018-10-03 ENCOUNTER — Ambulatory Visit (HOSPITAL_COMMUNITY)
Admission: EM | Admit: 2018-10-03 | Discharge: 2018-10-03 | Disposition: A | Discharge status: Home / Self Care | Payer: Medicaid Other | Attending: Urgent Care | Admitting: Urgent Care

## 2018-10-03 ENCOUNTER — Other Ambulatory Visit: Payer: Self-pay

## 2018-10-03 DIAGNOSIS — Z7251 High risk heterosexual behavior: Secondary | ICD-10-CM | POA: Insufficient documentation

## 2018-10-03 DIAGNOSIS — N760 Acute vaginitis: Secondary | ICD-10-CM

## 2018-10-03 DIAGNOSIS — N898 Other specified noninflammatory disorders of vagina: Secondary | ICD-10-CM

## 2018-10-03 LAB — POCT URINALYSIS DIP (DEVICE)
Bilirubin Urine: NEGATIVE
Glucose, UA: NEGATIVE mg/dL
Hgb urine dipstick: NEGATIVE
Ketones, ur: NEGATIVE mg/dL
Leukocytes,Ua: NEGATIVE
Nitrite: NEGATIVE
Protein, ur: NEGATIVE mg/dL
Specific Gravity, Urine: >=1.03 (ref 1.005–1.030)
Urobilinogen, UA: 0.2 mg/dL (ref 0.0–1.0)
pH: 5.5 (ref 5.0–8.0)

## 2018-10-03 MED ORDER — CEFTRIAXONE SODIUM 250 MG IJ SOLR
INTRAMUSCULAR | Status: AC
  Filled 2018-10-03: qty 250

## 2018-10-03 MED ORDER — CEFTRIAXONE SODIUM 250 MG IJ SOLR
250.0000 mg | Freq: Once | INTRAMUSCULAR | Status: AC
  Administered 2018-10-03: 250 mg via INTRAMUSCULAR

## 2018-10-03 MED ORDER — AZITHROMYCIN 250 MG PO TABS
ORAL_TABLET | ORAL | Status: AC
  Filled 2018-10-03: qty 4

## 2018-10-03 MED ORDER — AZITHROMYCIN 250 MG PO TABS
1000.0000 mg | ORAL_TABLET | Freq: Once | ORAL | Status: AC
  Administered 2018-10-03: 1000 mg via ORAL

## 2018-10-03 NOTE — Discharge Instructions (Signed)
Avoid all forms of sexual intercourse (oral, vaginal, anal) for the next 7 days to avoid spreading/reinfecting. Return if symptoms worsen/do not resolve, you develop fever, abdominal pain, blood in your urine, or are re-exposed to an STI.  

## 2018-10-03 NOTE — ED Provider Notes (Signed)
MRN: [id number] DOB: 09/23/1985  Subjective:   Ellen Camacho is a 33 y.o. female presenting for 4-day history of moderate green vaginal discharge with genital itching and urinary frequency.  Patient states that she recently had sex with a new partner, did not use condoms.  She just found out that the same sex partner has been having sex with multiple other women that he barely knows.  She would like to get treated and also tested.  She denies any allergies to antibiotics.  LMP was 07/13-16/2020, was regular.  Patient has not been sexually active since then.  No current facility-administered medications for this encounter.   Current Outpatient Medications:  .  docusate sodium (COLACE) 100 MG capsule, Take 1 capsule (100 mg total) by mouth 2 (two) times daily., Disp: 30 capsule, Rfl: 0 .  ibuprofen (ADVIL,MOTRIN) 600 MG tablet, Take 1 tablet (600 mg total) by mouth every 6 (six) hours., Disp: 30 tablet, Rfl: 0   No Known Allergies  Past Medical History:  Diagnosis Date  . Anemia   . Headache   . HSV infection   . Seasonal allergies   . STD (female)      Past Surgical History:  Procedure Laterality Date  . NO PAST SURGERIES      Review of Systems  Constitutional: Negative for chills, fever and malaise/fatigue.  Respiratory: Negative for cough and shortness of breath.   Cardiovascular: Negative for chest pain.  Gastrointestinal: Negative for abdominal pain, constipation, diarrhea, nausea and vomiting.  Genitourinary: Positive for frequency. Negative for dysuria, flank pain, hematuria and urgency.       Denies genital rash, genital pain.  Musculoskeletal: Negative for back pain and myalgias.  Skin: Negative for rash.  Neurological: Negative for dizziness and headaches.  Psychiatric/Behavioral: Negative for substance abuse.     Objective:   Vitals: BP 122/83 (BP Location: Left Arm)   Pulse 71   Temp 98.1 F (36.7 C) (Temporal)   Resp 16   SpO2 100%   Physical  Exam Constitutional:      General: She is not in acute distress.    Appearance: Normal appearance. She is well-developed. She is not ill-appearing.  HENT:     Head: Normocephalic and atraumatic.     Nose: Nose normal.     Mouth/Throat:     Mouth: Mucous membranes are moist.     Pharynx: Oropharynx is clear.  Eyes:     General: No scleral icterus.    Extraocular Movements: Extraocular movements intact.     Pupils: Pupils are equal, round, and reactive to light.  Cardiovascular:     Rate and Rhythm: Normal rate.  Pulmonary:     Effort: Pulmonary effort is normal.  Skin:    General: Skin is warm and dry.  Neurological:     General: No focal deficit present.     Mental Status: She is alert and oriented to person, place, and time.  Psychiatric:        Mood and Affect: Mood normal.        Behavior: Behavior normal.     Results for orders placed or performed during the hospital encounter of 10/03/18 (from the past 24 hour(s))  POCT urinalysis dip (device)     Status: None   Collection Time: 10/03/18  2:07 PM  Result Value Ref Range   Glucose, UA NEGATIVE NEGATIVE mg/dL   Bilirubin Urine NEGATIVE NEGATIVE   Ketones, ur NEGATIVE NEGATIVE mg/dL   Specific Gravity, Urine >=1.030 1.005 -  1.030   Hgb urine dipstick NEGATIVE NEGATIVE   pH 5.5 5.0 - 8.0   Protein, ur NEGATIVE NEGATIVE mg/dL   Urobilinogen, UA 0.2 0.0 - 1.0 mg/dL   Nitrite NEGATIVE NEGATIVE   Leukocytes,Ua NEGATIVE NEGATIVE    Assessment and Plan :   1. Vaginal discharge   2. Acute vaginitis   3. Unprotected sex     We will treat empirically per CDC guidelines for gonorrhea and chlamydia with IM ceftriaxone and azithromycin in clinic.  Counseled on safe sex practices including abstaining for 1 week following treatment.  Labs pending.  Counseled patient on potential for adverse effects with medications prescribed/recommended today, ER and return-to-clinic precautions discussed, patient verbalized understanding.     Jaynee Eagles, [place] 10/03/18 7169

## 2018-10-05 LAB — CERVICOVAGINAL ANCILLARY ONLY
Bacterial vaginitis: POSITIVE — AB
Candida vaginitis: NEGATIVE
Chlamydia: NEGATIVE
Neisseria Gonorrhea: NEGATIVE
Trichomonas: NEGATIVE

## 2018-10-07 ENCOUNTER — Telehealth (HOSPITAL_COMMUNITY): Payer: Self-pay | Admitting: Emergency Medicine

## 2018-10-07 MED ORDER — METRONIDAZOLE 500 MG PO TABS: 500.0000 mg | ORAL_TABLET | Freq: Two times a day (BID) | ORAL | 0 refills | Status: AC

## 2018-10-07 NOTE — Telephone Encounter (Signed)
Bacterial vaginosis is positive. This was not treated at the urgent care visit.  Flagyl 500 mg BID x 7 days #14 no refills sent to patients pharmacy of choice.    Patient contacted and made aware of    results, all questions answered   

## 2018-11-17 ENCOUNTER — Other Ambulatory Visit: Payer: Self-pay

## 2018-11-17 ENCOUNTER — Encounter (HOSPITAL_COMMUNITY): Payer: Self-pay | Admitting: Emergency Medicine

## 2018-11-17 ENCOUNTER — Emergency Department (HOSPITAL_COMMUNITY)
Admission: EM | Admit: 2018-11-17 | Discharge: 2018-11-18 | Disposition: A | Discharge status: Home / Self Care | Payer: Medicaid Other | Attending: Emergency Medicine | Admitting: Emergency Medicine

## 2018-11-17 DIAGNOSIS — F17210 Nicotine dependence, cigarettes, uncomplicated: Secondary | ICD-10-CM | POA: Insufficient documentation

## 2018-11-17 DIAGNOSIS — R1011 Right upper quadrant pain: Secondary | ICD-10-CM | POA: Insufficient documentation

## 2018-11-17 DIAGNOSIS — R112 Nausea with vomiting, unspecified: Secondary | ICD-10-CM | POA: Diagnosis not present

## 2018-11-17 DIAGNOSIS — R1013 Epigastric pain: Secondary | ICD-10-CM | POA: Diagnosis present

## 2018-11-17 DIAGNOSIS — R1901 Right upper quadrant abdominal swelling, mass and lump: Secondary | ICD-10-CM

## 2018-11-17 LAB — URINALYSIS, ROUTINE W REFLEX MICROSCOPIC
Bilirubin Urine: NEGATIVE
Glucose, UA: NEGATIVE mg/dL
Hgb urine dipstick: NEGATIVE
Ketones, ur: NEGATIVE mg/dL
Leukocytes,Ua: NEGATIVE
Nitrite: NEGATIVE
Protein, ur: 100 mg/dL — AB
Specific Gravity, Urine: 1.016 (ref 1.005–1.030)
pH: 9 — ABNORMAL HIGH (ref 5.0–8.0)

## 2018-11-17 LAB — CBC
HCT: 41.3 % (ref 36.0–46.0)
Hemoglobin: 13.7 g/dL (ref 12.0–15.0)
MCH: 30 pg (ref 26.0–34.0)
MCHC: 33.2 g/dL (ref 30.0–36.0)
MCV: 90.6 fL (ref 80.0–100.0)
Platelets: 316 10*3/uL (ref 150–400)
RBC: 4.56 MIL/uL (ref 3.87–5.11)
RDW: 13.8 % (ref 11.5–15.5)
WBC: 15.8 10*3/uL — ABNORMAL HIGH (ref 4.0–10.5)
nRBC: 0 % (ref 0.0–0.2)

## 2018-11-17 LAB — COMPREHENSIVE METABOLIC PANEL
ALT: 26 U/L (ref 0–44)
AST: 29 U/L (ref 15–41)
Albumin: 4.5 g/dL (ref 3.5–5.0)
Alkaline Phosphatase: 80 U/L (ref 38–126)
Anion gap: 11 (ref 5–15)
BUN: 10 mg/dL (ref 6–20)
CO2: 24 mmol/L (ref 22–32)
Calcium: 9.4 mg/dL (ref 8.9–10.3)
Chloride: 104 mmol/L (ref 98–111)
Creatinine, Ser: 0.86 mg/dL (ref 0.44–1.00)
GFR calc Af Amer: >60 mL/min (ref >60)
GFR calc non Af Amer: >60 mL/min (ref >60)
Glucose, Bld: 107 mg/dL — ABNORMAL HIGH (ref 70–99)
Potassium: 3.5 mmol/L (ref 3.5–5.1)
Sodium: 139 mmol/L (ref 135–145)
Total Bilirubin: 0.6 mg/dL (ref 0.3–1.2)
Total Protein: 7.7 g/dL (ref 6.5–8.1)

## 2018-11-17 LAB — I-STAT BETA HCG BLOOD, ED (MC, WL, AP ONLY): I-stat hCG, quantitative: <5 m[IU]/mL (ref <5)

## 2018-11-17 LAB — LIPASE, BLOOD: Lipase: 17 U/L (ref 11–51)

## 2018-11-17 MED ORDER — SODIUM CHLORIDE 0.9% FLUSH
3.0000 mL | Freq: Once | INTRAVENOUS | Status: AC
  Administered 2018-11-18: 04:00:00 3 mL via INTRAVENOUS

## 2018-11-17 MED ORDER — ONDANSETRON 4 MG PO TBDP
4.0000 mg | ORAL_TABLET | Freq: Once | ORAL | Status: AC | PRN
  Administered 2018-11-17: 4 mg via ORAL
  Filled 2018-11-17: qty 1

## 2018-11-17 NOTE — ED Notes (Signed)
Patient mother called asking for a call back she is out in parking lot  [employer] [phone]

## 2018-11-17 NOTE — ED Triage Notes (Signed)
PT to ED with c/o epigastric pain, nausea and vomiting, st's unable to keep anything down.  Pt denies diarrhea

## 2018-11-18 ENCOUNTER — Emergency Department (HOSPITAL_COMMUNITY): Payer: Medicaid Other

## 2018-11-18 LAB — CBG MONITORING, ED: Glucose-Capillary: 107 mg/dL — ABNORMAL HIGH (ref 70–99)

## 2018-11-18 MED ORDER — ONDANSETRON HCL 4 MG PO TABS: 4.0000 mg | ORAL_TABLET | Freq: Four times a day (QID) | ORAL | 0 refills | Status: DC

## 2018-11-18 MED ORDER — SODIUM CHLORIDE 0.9 % IV BOLUS
1000.0000 mL | Freq: Once | INTRAVENOUS | Status: AC
  Administered 2018-11-18: 04:00:00 1000 mL via INTRAVENOUS

## 2018-11-18 MED ORDER — IOHEXOL 300 MG/ML  SOLN
100.0000 mL | Freq: Once | INTRAMUSCULAR | Status: AC | PRN
  Administered 2018-11-18: 100 mL via INTRAVENOUS

## 2018-11-18 MED ORDER — HYDROMORPHONE HCL 1 MG/ML IJ SOLN
0.5000 mg | Freq: Once | INTRAMUSCULAR | Status: AC
  Administered 2018-11-18: 04:00:00 0.5 mg via INTRAVENOUS
  Filled 2018-11-18: qty 1

## 2018-11-18 MED ORDER — ONDANSETRON HCL 4 MG/2ML IJ SOLN
4.0000 mg | Freq: Once | INTRAMUSCULAR | Status: AC
  Administered 2018-11-18: 04:00:00 4 mg via INTRAVENOUS
  Filled 2018-11-18: qty 2

## 2018-11-18 NOTE — ED Provider Notes (Signed)
7:39 AM signout from [place] PA-C at shift change.  Patient with nausea vomiting, abdominal pain.  Imaging reassuring.  Signed out pending completion of fluid challenge.  Patient is tolerating apple juice.  Will discharge.  Results discussed with patient by previous provider.  BP (!) 153/104 (BP Location: Left Arm)   Pulse 66   Temp 98.3 F (36.8 C) (Oral)   Resp 17   Ht 5\' 6"  (1.676 m)   Wt 56.7 kg   LMP 11/12/2018 (Exact Date)   SpO2 94%   BMI 20.18 kg/m       Carlisle Cater, PA-C 11/18/18 0740    Tegeler, Gwenyth Allegra, MD 11/18/18 [phone]

## 2018-11-18 NOTE — ED Provider Notes (Signed)
[facility] Provider Note   CSN: [id number] Arrival date & time: 11/17/18  1836     History   Chief Complaint Chief Complaint  Patient presents with  . Abdominal Pain  . Emesis    HPI Ellen Camacho is a 33 y.o. female with a history of HSP, anemia, headaches, and seasonal allergies who presents to the emergency department with a chief complaint of epigastric pain.  The patient reports sudden onset, constant, nonradiating, sharp, stabbing epigastric pain "that feels like someone is punching her in the stomach" that began on the morning of 9/6 accompanied by multiple episodes of bilious vomiting and nausea.  She has been unable to keep down any food or drink by mouth all day.  Attempting to drink fluids makes the pain worse.  No other known aggravating or alleviating factors.  No fevers, chills, headache, dizziness, lightheadedness, back pain, flank pain, dysuria, hematuria, vaginal pain, bleeding, or discharge.  No history of similar pain.  No history of abdominal surgery.  No treatment prior to arrival.     The history is provided by the patient. No language interpreter was used.    Past Medical History:  Diagnosis Date  . Anemia   . Headache   . HSV infection   . Seasonal allergies   . STD (female)     Patient Active Problem List   Diagnosis Date Noted  . Polyhydramnios affecting pregnancy in third trimester 04/24/2016  . 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 Supervision of high risk pregnancy, antepartum 11/12/2014  . Tobacco use in pregnancy, antepartum 11/12/2014  . Limited prenatal care, antepartum 10/31/2014    Past Surgical History:  Procedure Laterality Date  . NO PAST SURGERIES       OB History    Gravida  6   Para  5   Term  4   Preterm  1   AB  1   Living  6     SAB  1   TAB  0   Ectopic  0   Multiple  1   Live Births  6            Home Medications    Prior to  Admission medications   Medication Sig Start Date End Date Taking? Authorizing Provider  ondansetron (ZOFRAN) 4 MG tablet Take 1 tablet (4 mg total) by mouth every 6 (six) hours. 11/18/18   Quante Pettry A, PA-C    Family History Family History  Problem Relation Age of Onset  . Cancer Mother   . Diabetes Mother   . Hypertension Mother   . Heart disease Mother   . Stroke Brother   . Cancer Maternal Grandfather     Social History Social History   Tobacco Use  . Smoking status: Current Some Day Smoker    Packs/day: 0.50    Types: Cigarettes    Last attempt to quit: 07/21/2014    Years since quitting: 4.3  . Smokeless tobacco: Never Used  Substance Use Topics  . Alcohol use: No  . Drug use: No     Allergies   Patient has no known allergies.   Review of Systems Review of Systems  Constitutional: Negative for activity change, chills and fever.  HENT: Negative for congestion and sore throat.   Eyes: Negative for visual disturbance.  Respiratory: Negative for shortness of breath and wheezing.   Cardiovascular: Negative for chest pain.  Gastrointestinal: Positive for abdominal pain,  nausea and vomiting. Negative for anal bleeding, blood in stool, constipation, diarrhea and rectal pain.  Genitourinary: Negative for dysuria, flank pain, frequency, hematuria, urgency, vaginal bleeding, vaginal discharge and vaginal pain.  Musculoskeletal: Negative for back pain, myalgias, neck pain and neck stiffness.  Skin: Negative for rash and wound.  Allergic/Immunologic: Negative for immunocompromised state.  Neurological: Negative for dizziness, seizures, syncope, weakness, numbness and headaches.  Psychiatric/Behavioral: Negative for confusion.   Physical Exam Updated Vital Signs BP (!) 153/104 (BP Location: Left Arm)   Pulse 66   Temp 98.3 F (36.8 C) (Oral)   Resp 17   Ht '5\' 6"'$  (1.676 m)   Wt 56.7 kg   LMP 11/12/2018 (Exact Date)   SpO2 94%   BMI 20.18 kg/m   Physical Exam  Vitals signs and nursing note reviewed.  Constitutional:      General: She is not in acute distress.    Appearance: She is not ill-appearing, toxic-appearing or diaphoretic.     Comments: Appears uncomfortable.  Thin female.  HENT:     Head: Normocephalic.  Eyes:     Conjunctiva/sclera: Conjunctivae normal.  Neck:     Musculoskeletal: Neck supple.  Cardiovascular:     Rate and Rhythm: Normal rate and regular rhythm.     Pulses: Normal pulses.     Heart sounds: Normal heart sounds. No murmur. No friction rub. No gallop.   Pulmonary:     Effort: Pulmonary effort is normal. No respiratory distress.     Breath sounds: No stridor. No wheezing, rhonchi or rales.  Chest:     Chest wall: No tenderness.  Abdominal:     General: There is no distension.     Palpations: Abdomen is soft. There is no mass.     Tenderness: There is abdominal tenderness. There is no right CVA tenderness, left CVA tenderness, guarding or rebound.     Hernia: A hernia is present.     Comments: Tender to palpation in the right upper quadrant.  No definite Murphy sign.  Abdomen is soft and nondistended.  There is a reducible ventral hernia superior to the umbilicus that is nontender.  No CVA tenderness bilaterally.  Musculoskeletal:     Right lower leg: No edema.     Left lower leg: No edema.  Skin:    General: Skin is warm.     Findings: No rash.  Neurological:     Mental Status: She is alert.  Psychiatric:        Behavior: Behavior normal.    ED Treatments / Results  Labs (all labs ordered are listed, but only abnormal results are displayed) Labs Reviewed  COMPREHENSIVE METABOLIC PANEL - Abnormal; Notable for the following components:      Result Value   Glucose, Bld 107 (*)    All other components within normal limits  CBC - Abnormal; Notable for the following components:   WBC 15.8 (*)    All other components within normal limits  URINALYSIS, ROUTINE W REFLEX MICROSCOPIC - Abnormal; Notable for the  following components:   APPearance CLOUDY (*)    pH 9.0 (*)    Protein, ur 100 (*)    Bacteria, UA RARE (*)    All other components within normal limits  CBG MONITORING, ED - Abnormal; Notable for the following components:   Glucose-Capillary 107 (*)    All other components within normal limits  LIPASE, BLOOD  I-STAT BETA HCG BLOOD, ED ([facility], [facility], [facility] ONLY)    EKG  None  Radiology Ct Abdomen Pelvis W Contrast  Result Date: 11/18/2018 CLINICAL DATA:  Nausea and vomiting.  Epigastric pain EXAM: CT ABDOMEN AND PELVIS WITH CONTRAST TECHNIQUE: Multidetector CT imaging of the abdomen and pelvis was performed using the standard protocol following bolus administration of intravenous contrast. CONTRAST:  152m OMNIPAQUE IOHEXOL 300 MG/ML  SOLN COMPARISON:  None. FINDINGS: Lower chest:  No contributory findings. Hepatobiliary: No focal liver abnormality.No evidence of biliary obstruction or stone. Pancreas: Unremarkable. Spleen: Unremarkable. Adrenals/Urinary Tract: Negative adrenals. No hydronephrosis or stone. Wedge of scarring at the lower pole right kidney. Unremarkable bladder. Stomach/Bowel:  No obstruction. No appendicitis. Vascular/Lymphatic: No acute vascular abnormality. No mass or adenopathy. Reproductive:No pathologic findings. Other: No ascites or pneumoperitoneum. Musculoskeletal: No acute abnormalities. IMPRESSION: Negative.  No explanation for abdominal pain. Electronically Signed   By: JMonte FantasiaM.D.   On: 11/18/2018 07:07   UKoreaAbdomen Limited Ruq  Result Date: 11/18/2018 CLINICAL DATA:  Right upper quadrant pain EXAM: ULTRASOUND ABDOMEN LIMITED RIGHT UPPER QUADRANT COMPARISON:  None. FINDINGS: Gallbladder: No gallstones or wall thickening visualized. No sonographic Murphy sign noted by sonographer. Common bile duct: Diameter: Normal caliber, 3 mm Liver: No focal lesion identified. Within normal limits in parenchymal echogenicity. Portal vein is patent on color Doppler imaging with  normal direction of blood flow towards the liver. Other: None. IMPRESSION: Normal right upper quadrant ultrasound. Electronically Signed   By: KRolm BaptiseM.D.   On: 11/18/2018 03:58    Procedures Procedures (including critical care time)  Medications Ordered in ED Medications  sodium chloride flush (NS) 0.9 % injection 3 mL (3 mLs Intravenous Given 11/18/18 0339)  ondansetron (ZOFRAN-ODT) disintegrating tablet 4 mg (4 mg Oral Given 11/17/18 1909)  sodium chloride 0.9 % bolus 1,000 mL (0 mLs Intravenous Stopped 11/18/18 0612)  ondansetron (ZOFRAN) injection 4 mg (4 mg Intravenous Given 11/18/18 0338)  HYDROmorphone (DILAUDID) injection 0.5 mg (0.5 mg Intravenous Given 11/18/18 0339)  iohexol (OMNIPAQUE) 300 MG/ML solution 100 mL (100 mLs Intravenous Contrast Given 11/18/18 0600)     Initial Impression / Assessment and Plan / ED Course  I have reviewed the triage vital signs and the nursing notes.  Pertinent labs & imaging results that were available during my care of the patient were reviewed by me and considered in my medical decision making (see chart for details).  33year old female with a history of HSP, anemia, headaches, and seasonal allergies with no history of abdominal surgery presenting with epigastric abdominal pain, bilious vomiting, and nausea that began almost 24 hours ago.  No constitutional symptoms.  On exam, she has tenderness to palpation in the epigastric and right upper quadrant.  Lipase is normal as Camacho as transaminases, bilirubin, and alk phos.  She has a leukocytosis of 15.8.  Given her exam, will order right upper quadrant ultrasound to assess for cholecystitis.    Clinical Course as of Nov 18 723  Mon Nov 18, 2018  0408 Patient recheck.  Updated patient on unremarkable right upper quadrant ultrasound.  She reports significant improvement in pain control nausea at this time.  On reexamination of the abdomen, she continues to have epigastric tenderness, but now the pain is  controlled, she has left upper and lower tenderness to palpation without rebound or guarding.  CT abdomen pelvis has been ordered.   [MM]    Clinical Course User Index [MM] Kyeshia Zinn A, PA-C   CT scan is unremarkable.  Findings discussed with the patient.  Will plan for  fluid challenge with likely disposition to home with antiemetics and outpatient follow-up as needed.  Patient care transferred to PA Geiple at the end of my shift for fluid challenge. Patient presentation, ED course, and plan of care discussed with review of all pertinent labs and imaging. Please see his/her note for further details regarding further ED course and disposition.         Final Clinical Impressions(s) / ED Diagnoses   Final diagnoses:  RUQ abdominal mass  Non-intractable vomiting with nausea, unspecified vomiting type    ED Discharge Orders         Ordered    ondansetron (ZOFRAN) 4 MG tablet  Every 6 hours     11/18/18 0718           Joanne Gavel, PA-C 11/18/18 0725    Ezequiel Essex, MD 11/18/18 [phone]

## 2018-11-18 NOTE — ED Notes (Signed)
Updated on wait for treatment room. 

## 2018-11-18 NOTE — ED Notes (Signed)
Pt able to drink apple juice without n/v

## 2018-11-18 NOTE — Discharge Instructions (Addendum)
Thank you for allowing me to care for you today in the Emergency Department.   You can take 1 tablet of Zofran by mouth every 6 hours as needed for nausea or vomiting.  Take 650 mg of Tylenol or 600 mg of ibuprofen with food every 6 hours for pain.  You can alternate between these 2 medications every 3 hours if your pain returns.  For instance, you can take Tylenol at noon, followed by a dose of ibuprofen at 3, followed by second dose of Tylenol and 6.  If your pain persists, follow-up with primary care.  Return to the emergency department if you develop persistent vomiting despite taking Zofran, fevers, severe abdominal pain, black or bloody stools or vomiting, or other new, concerning symptoms.

## 2019-02-01 ENCOUNTER — Other Ambulatory Visit: Payer: Self-pay

## 2019-02-01 ENCOUNTER — Encounter (HOSPITAL_COMMUNITY): Payer: Self-pay | Admitting: Emergency Medicine

## 2019-02-01 ENCOUNTER — Emergency Department (HOSPITAL_COMMUNITY): Payer: Medicaid Other

## 2019-02-01 ENCOUNTER — Emergency Department (HOSPITAL_COMMUNITY)
Admission: EM | Admit: 2019-02-01 | Discharge: 2019-02-01 | Disposition: A | Discharge status: Home / Self Care | Payer: Medicaid Other | Attending: Emergency Medicine | Admitting: Emergency Medicine

## 2019-02-01 DIAGNOSIS — F17210 Nicotine dependence, cigarettes, uncomplicated: Secondary | ICD-10-CM | POA: Insufficient documentation

## 2019-02-01 DIAGNOSIS — S0083XA Contusion of other part of head, initial encounter: Secondary | ICD-10-CM

## 2019-02-01 DIAGNOSIS — S0993XA Unspecified injury of face, initial encounter: Secondary | ICD-10-CM | POA: Diagnosis present

## 2019-02-01 DIAGNOSIS — Y9289 Other specified places as the place of occurrence of the external cause: Secondary | ICD-10-CM | POA: Diagnosis not present

## 2019-02-01 DIAGNOSIS — Y999 Unspecified external cause status: Secondary | ICD-10-CM | POA: Insufficient documentation

## 2019-02-01 DIAGNOSIS — Y9389 Activity, other specified: Secondary | ICD-10-CM | POA: Insufficient documentation

## 2019-02-01 MED ORDER — NAPROXEN 250 MG PO TABS
500.0000 mg | ORAL_TABLET | Freq: Once | ORAL | Status: AC
  Administered 2019-02-01: 500 mg via ORAL
  Filled 2019-02-01: qty 2

## 2019-02-01 NOTE — Discharge Instructions (Signed)
Apply ice to areas of pain and swelling to limit inflammation.  We recommend 600 mg ibuprofen every 6 hours for management of pain.  You may take this with Tylenol as needed for persistent discomfort.  Lidoderm patches may also provide some pain relief.  These can be purchased over-the-counter at your local pharmacy.  Follow-up with a primary care doctor as needed in 1 week.  You may return for new or concerning symptoms.
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
## 2019-09-24 ENCOUNTER — Encounter (HOSPITAL_COMMUNITY): Payer: Self-pay

## 2019-09-24 ENCOUNTER — Ambulatory Visit (HOSPITAL_COMMUNITY)
Admission: RE | Admit: 2019-09-24 | Discharge: 2019-09-24 | Disposition: A | Discharge status: Home / Self Care | Payer: Medicaid Other | Source: Ambulatory Visit | Attending: Urgent Care | Admitting: Urgent Care

## 2019-09-24 ENCOUNTER — Other Ambulatory Visit: Payer: Self-pay

## 2019-09-24 VITALS — BP 130/87 | HR 68 | Temp 98.9°F | Resp 17

## 2019-09-24 DIAGNOSIS — Z202 Contact with and (suspected) exposure to infections with a predominantly sexual mode of transmission: Secondary | ICD-10-CM

## 2019-09-24 DIAGNOSIS — N898 Other specified noninflammatory disorders of vagina: Secondary | ICD-10-CM

## 2019-09-24 DIAGNOSIS — N760 Acute vaginitis: Secondary | ICD-10-CM | POA: Diagnosis present

## 2019-09-24 DIAGNOSIS — B9689 Other specified bacterial agents as the cause of diseases classified elsewhere: Secondary | ICD-10-CM | POA: Insufficient documentation

## 2019-09-24 LAB — POC URINE PREG, ED: Preg Test, Ur: NEGATIVE

## 2019-09-24 LAB — HIV ANTIBODY (ROUTINE TESTING W REFLEX): HIV Screen 4th Generation wRfx: NONREACTIVE

## 2019-09-24 MED ORDER — METRONIDAZOLE 500 MG PO TABS
500.0000 mg | ORAL_TABLET | Freq: Two times a day (BID) | ORAL | 0 refills | Status: DC
Start: 2019-09-24 — End: 2020-10-25

## 2019-09-24 NOTE — Discharge Instructions (Addendum)
[email or web address]

## 2019-09-24 NOTE — ED Provider Notes (Signed)
[facility]   MRN: [id number] DOB: 1985-09-14  Subjective:   Ellen Camacho is a 34 y.o. female presenting for 4-day history of thick white vaginal discharge.  Patient is sexually active, does not use condoms for protection.  Would like to make sure she gets STI testing including HIV and syphilis.  Has a history of BV, has also had chlamydia.  No current facility-administered medications for this encounter.  Current Outpatient Medications:  .  ondansetron (ZOFRAN) 4 MG tablet, Take 1 tablet (4 mg total) by mouth every 6 (six) hours., Disp: 12 tablet, Rfl: 0   No Known Allergies  Past Medical History:  Diagnosis Date  . Anemia   . Headache   . HSV infection   . Seasonal allergies   . STD (female)      Past Surgical History:  Procedure Laterality Date  . NO PAST SURGERIES      Family History  Problem Relation Age of Onset  . Cancer Mother   . Diabetes Mother   . Hypertension Mother   . Heart disease Mother   . Stroke Brother   . Cancer Maternal Grandfather     Social History   Tobacco Use  . Smoking status: Current Some Day Smoker    Packs/day: 0.50    Types: Cigarettes    Last attempt to quit: 07/21/2014    Years since quitting: 5.1  . Smokeless tobacco: Never Used  Substance Use Topics  . Alcohol use: Yes    Comment: occ  . Drug use: No    ROS   Objective:   Vitals: BP 130/87 (BP Location: Right Arm)   Pulse 68   Temp 98.9 F (37.2 C) (Oral)   Resp 17   LMP  (Within Weeks) Comment: 3 weeks  SpO2 98%   Physical Exam Constitutional:      General: She is not in acute distress.    Appearance: Normal appearance. She is well-developed. She is not ill-appearing, toxic-appearing or diaphoretic.  HENT:     Head: Normocephalic and atraumatic.     Nose: Nose normal.     Mouth/Throat:     Mouth: Mucous membranes are moist.     Pharynx: Oropharynx is clear.  Eyes:     General: No scleral icterus.       Right eye: No discharge.         Left eye: No discharge.     Extraocular Movements: Extraocular movements intact.     Conjunctiva/sclera: Conjunctivae normal.     Pupils: Pupils are equal, round, and reactive to light.  Cardiovascular:     Rate and Rhythm: Normal rate.  Pulmonary:     Effort: Pulmonary effort is normal.  Abdominal:     General: Bowel sounds are normal. There is no distension.     Palpations: Abdomen is soft.     Tenderness: There is no abdominal tenderness. There is no right CVA tenderness, left CVA tenderness, guarding or rebound.  Skin:    General: Skin is warm and dry.  Neurological:     General: No focal deficit present.     Mental Status: She is alert and oriented to person, place, and time.  Psychiatric:        Mood and Affect: Mood normal.        Behavior: Behavior normal.        Thought Content: Thought content normal.        Judgment: Judgment normal.     Results for orders  placed or performed during the hospital encounter of 09/24/19 (from the past 24 hour(s))  POC urine pregnancy     Status: None   Collection Time: 09/24/19 12:40 PM  Result Value Ref Range   Preg Test, Ur NEGATIVE NEGATIVE    Assessment and Plan :   PDMP not reviewed this encounter.  1. Vaginal discharge   2. BV (bacterial vaginosis)     Will cover for bacterial vaginosis with Flagyl.  STI testing pending. Counseled patient on potential for adverse effects with medications prescribed/recommended today, ER and return-to-clinic precautions discussed, patient verbalized understanding.    Wallis Bamberg, PA-C 09/24/19 1409

## 2019-09-24 NOTE — ED Triage Notes (Signed)
Pt presents with white vaginal discharge x 4 days. Pt requested STD's testing.

## 2019-09-25 LAB — CERVICOVAGINAL ANCILLARY ONLY
Bacterial Vaginitis (gardnerella): POSITIVE — AB
Candida Glabrata: NEGATIVE
Candida Vaginitis: POSITIVE — AB
Chlamydia: NEGATIVE
Comment: NEGATIVE
Comment: NEGATIVE
Comment: NEGATIVE
Comment: NEGATIVE
Comment: NEGATIVE
Comment: NORMAL
Neisseria Gonorrhea: POSITIVE — AB
Trichomonas: NEGATIVE

## 2019-09-25 LAB — RPR: RPR Ser Ql: NONREACTIVE

## 2019-09-30 ENCOUNTER — Telehealth: Payer: Self-pay

## 2019-09-30 DIAGNOSIS — B379 Candidiasis, unspecified: Secondary | ICD-10-CM

## 2019-09-30 DIAGNOSIS — B3749 Other urogenital candidiasis: Secondary | ICD-10-CM

## 2019-09-30 DIAGNOSIS — A549 Gonococcal infection, unspecified: Secondary | ICD-10-CM

## 2019-09-30 MED ORDER — FLUCONAZOLE 200 MG PO TABS: 200.0000 mg | ORAL_TABLET | Freq: Every day | ORAL | 0 refills | Status: AC

## 2019-09-30 MED ORDER — CEFTRIAXONE SODIUM 1 G IJ SOLR: 500.0000 mg | Freq: Once | INTRAMUSCULAR | 0 refills | Status: AC

## 2019-11-09 ENCOUNTER — Ambulatory Visit (INDEPENDENT_AMBULATORY_CARE_PROVIDER_SITE_OTHER): Payer: Medicaid Other

## 2019-11-09 ENCOUNTER — Ambulatory Visit (HOSPITAL_COMMUNITY)
Admission: EM | Admit: 2019-11-09 | Discharge: 2019-11-09 | Disposition: A | Discharge status: Home / Self Care | Payer: Medicaid Other | Attending: Physician Assistant | Admitting: Physician Assistant

## 2019-11-09 ENCOUNTER — Other Ambulatory Visit: Payer: Self-pay

## 2019-11-09 ENCOUNTER — Encounter (HOSPITAL_COMMUNITY): Payer: Self-pay

## 2019-11-09 DIAGNOSIS — M25572 Pain in left ankle and joints of left foot: Secondary | ICD-10-CM | POA: Diagnosis not present

## 2019-11-09 DIAGNOSIS — S99922A Unspecified injury of left foot, initial encounter: Secondary | ICD-10-CM

## 2019-11-09 DIAGNOSIS — M25562 Pain in left knee: Secondary | ICD-10-CM

## 2019-11-09 MED ORDER — IBUPROFEN 800 MG PO TABS
800.0000 mg | ORAL_TABLET | Freq: Three times a day (TID) | ORAL | 0 refills | Status: DC
Start: 2019-11-09 — End: 2020-10-25

## 2019-11-09 NOTE — ED Triage Notes (Signed)
Pt presents with left knee pain that started last night. Reports she fell and twisted her knee and ankle. Reports she slipped on a rug causing her to fall. Pt is favoring left side when walking. Denies any otc treatment.

## 2019-11-09 NOTE — Discharge Instructions (Addendum)
Apply ice as directed Can take Ibuprofen as needed for pain

## 2019-11-09 NOTE — ED Provider Notes (Signed)
[facility]    CSN: [id number] Arrival date & time: 11/09/19  1126      History   Chief Complaint Chief Complaint  Patient presents with  . Knee Pain    HPI Ellen Camacho is a 34 y.o. female.   Pt complains of left knee pain and left ankle pain that started yesterday after she slipped on a rug and fell, landing on her left knee.  Reports she twisted her left ankle.  She has tried nothing for the sx.  Reports pain is worse with weight bearing, ambulation.  She denies previous knee problems.       Past Medical History:  Diagnosis Date  . Anemia   . Headache   . HSV infection   . Seasonal allergies   . STD (female)     Patient Active Problem List   Diagnosis Date Noted  . Polyhydramnios affecting pregnancy in third trimester 04/24/2016  . 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 Supervision of high risk pregnancy, antepartum 11/12/2014  . Tobacco use in pregnancy, antepartum 11/12/2014  . Limited prenatal care, antepartum 10/31/2014    Past Surgical History:  Procedure Laterality Date  . NO PAST SURGERIES      OB History    Gravida  6   Para  5   Term  4   Preterm  1   AB  1   Living  6     SAB  1   TAB  0   Ectopic  0   Multiple  1   Live Births  6            Home Medications    Prior to Admission medications   Medication Sig Start Date End Date Taking? Authorizing Provider  metroNIDAZOLE (FLAGYL) 500 MG tablet Take 1 tablet (500 mg total) by mouth 2 (two) times daily with a meal. DO NOT CONSUME ALCOHOL WHILE TAKING THIS MEDICATION. 09/24/19   Wallis Bamberg, PA-C    Family History Family History  Problem Relation Age of Onset  . Cancer Mother   . Diabetes Mother   . Hypertension Mother   . Heart disease Mother   . Stroke Brother   . Cancer Maternal Grandfather   . Healthy Father     Social History Social History   Tobacco Use  . Smoking status: Current Some Day Smoker    Packs/day: 0.50     Types: Cigarettes    Last attempt to quit: 07/21/2014    Years since quitting: 5.3  . Smokeless tobacco: Never Used  Substance Use Topics  . Alcohol use: Yes    Comment: occ  . Drug use: No     Allergies   Patient has no known allergies.   Review of Systems Review of Systems  Constitutional: Negative for chills and fever.  HENT: Negative for ear pain and sore throat.   Eyes: Negative for pain and visual disturbance.  Respiratory: Negative for cough and shortness of breath.   Cardiovascular: Negative for chest pain and palpitations.  Gastrointestinal: Negative for abdominal pain and vomiting.  Genitourinary: Negative for dysuria and hematuria.  Musculoskeletal: Positive for arthralgias (left knee pain, left ankle pain) and gait problem (limping favoring right leg). Negative for back pain.  Skin: Positive for wound (abrasion to left knee). Negative for color change and rash.  Neurological: Negative for seizures and syncope.  All other systems reviewed and are negative.    Physical Exam Triage Vital Signs  ED Triage Vitals [11/09/19 1242]  Enc Vitals Group     BP (!) 148/103     Pulse Rate 73     Resp 18     Temp 98.4 F (36.9 C)     Temp src      SpO2 100 %     Weight      Height      Head Circumference      Peak Flow      Pain Score 10     Pain Loc      Pain Edu?      Excl. in GC?    No data found.  Updated Vital Signs BP (!) 148/103   Pulse 73   Temp 98.4 F (36.9 C)   Resp 18   LMP 10/30/2019   SpO2 100%   Visual Acuity Right Eye Distance:   Left Eye Distance:   Bilateral Distance:    Right Eye Near:   Left Eye Near:    Bilateral Near:     Physical Exam Vitals and nursing note reviewed.  Constitutional:      General: She is not in acute distress.    Appearance: She is well-developed.  HENT:     Head: Normocephalic and atraumatic.  Eyes:     Conjunctiva/sclera: Conjunctivae normal.  Cardiovascular:     Rate and Rhythm: Normal rate and  regular rhythm.     Heart sounds: No murmur heard.   Pulmonary:     Effort: Pulmonary effort is normal. No respiratory distress.     Breath sounds: Normal breath sounds.  Abdominal:     Palpations: Abdomen is soft.     Tenderness: There is no abdominal tenderness.  Musculoskeletal:     Cervical back: Neck supple.     Right knee: Normal.     Left knee: Bony tenderness present. No swelling or erythema. Tenderness present over the lateral joint line. No LCL laxity, MCL laxity, ACL laxity or PCL laxity.Normal alignment. Normal pulse.     Right ankle: Normal.     Left ankle: No swelling or deformity. Tenderness present over the lateral malleolus and medial malleolus. Normal range of motion. Anterior drawer test negative. Normal pulse.     Left Achilles Tendon: No tenderness.       Legs:  Skin:    General: Skin is warm and dry.  Neurological:     Mental Status: She is alert.      UC Treatments / Results  Labs (all labs ordered are listed, but only abnormal results are displayed) Labs Reviewed - No data to display  EKG   Radiology No results found.  Procedures Procedures (including critical care time)  Medications Ordered in UC Medications - No data to display  Initial Impression / Assessment and Plan / UC Course  I have reviewed the triage vital signs and the nursing notes.  Pertinent labs & imaging results that were available during my care of the patient were reviewed by me and considered in my medical decision making (see chart for details).     Xrays negative for fracture, radiology over read pending.  Advise is and ibuprofen as needed.  Pt will return if no improvement.  Final Clinical Impressions(s) / UC Diagnoses   Final diagnoses:  None   Discharge Instructions   None    ED Prescriptions    None     PDMP not reviewed this encounter.   Jodell Cipro, PA-C 11/09/19 1328

## 2019-12-03 ENCOUNTER — Encounter (HOSPITAL_COMMUNITY): Payer: Self-pay | Admitting: *Deleted

## 2019-12-03 ENCOUNTER — Emergency Department (HOSPITAL_COMMUNITY): Payer: Medicaid Other

## 2019-12-03 ENCOUNTER — Emergency Department (HOSPITAL_COMMUNITY)
Admission: EM | Admit: 2019-12-03 | Discharge: 2019-12-03 | Disposition: A | Discharge status: Home / Self Care | Payer: Medicaid Other | Attending: Emergency Medicine | Admitting: Emergency Medicine

## 2019-12-03 DIAGNOSIS — S0990XA Unspecified injury of head, initial encounter: Secondary | ICD-10-CM | POA: Diagnosis not present

## 2019-12-03 DIAGNOSIS — Y92143 Cell of prison as the place of occurrence of the external cause: Secondary | ICD-10-CM | POA: Insufficient documentation

## 2019-12-03 DIAGNOSIS — S0512XA Contusion of eyeball and orbital tissues, left eye, initial encounter: Secondary | ICD-10-CM

## 2019-12-03 DIAGNOSIS — F17210 Nicotine dependence, cigarettes, uncomplicated: Secondary | ICD-10-CM | POA: Diagnosis not present

## 2019-12-03 DIAGNOSIS — W25XXXA Contact with sharp glass, initial encounter: Secondary | ICD-10-CM | POA: Diagnosis not present

## 2019-12-03 DIAGNOSIS — S0083XA Contusion of other part of head, initial encounter: Secondary | ICD-10-CM | POA: Insufficient documentation

## 2019-12-03 MED ORDER — ACETAMINOPHEN 500 MG PO TABS
1000.0000 mg | ORAL_TABLET | Freq: Once | ORAL | Status: AC
  Administered 2019-12-03: 1000 mg via ORAL
  Filled 2019-12-03: qty 2
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
## 2019-12-03 NOTE — Discharge Instructions (Addendum)
Your imaging is reassuring.  Apply ice as needed for pain.
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
## 2020-03-04 ENCOUNTER — Ambulatory Visit (HOSPITAL_COMMUNITY): Payer: Self-pay

## 2020-07-12 ENCOUNTER — Ambulatory Visit (INDEPENDENT_AMBULATORY_CARE_PROVIDER_SITE_OTHER): Payer: Medicaid Other

## 2020-07-12 ENCOUNTER — Other Ambulatory Visit: Payer: Self-pay

## 2020-07-12 DIAGNOSIS — Z3201 Encounter for pregnancy test, result positive: Secondary | ICD-10-CM | POA: Diagnosis not present

## 2020-07-12 DIAGNOSIS — Z3200 Encounter for pregnancy test, result unknown: Secondary | ICD-10-CM

## 2020-07-12 LAB — POCT PREGNANCY, URINE: Preg Test, Ur: POSITIVE — AB

## 2020-07-12 NOTE — Progress Notes (Signed)
Pt left urine sample in office for UPT; result is positive. Pt is 8w 2d today based on LMP of 05/15/20; EDD is 02/19/21. Pt reports she does not follow with OB/GYN currently. MyChart message sent with area providers for prenatal care, offered for pt to be seen in our practice as well. Reviewed medications and allergies. Encouraged pt to begin prenatal vitamin and try to schedule new OB appt as soon as able.  Fleet Contras RN 07/12/20

## 2020-07-12 NOTE — Progress Notes (Signed)
Patient was assessed and managed by nursing staff during this encounter. I have reviewed the chart and agree with the documentation and plan. I have also made any necessary editorial changes.  Warden Fillers, MD 07/12/2020 2:36 PM

## 2020-10-25 ENCOUNTER — Other Ambulatory Visit: Payer: Self-pay

## 2020-10-25 ENCOUNTER — Inpatient Hospital Stay (HOSPITAL_COMMUNITY)
Admission: AD | Admit: 2020-10-25 | Discharge: 2020-10-25 | Disposition: A | Discharge status: Home / Self Care | Payer: Medicaid Other | Attending: Family Medicine | Admitting: Family Medicine

## 2020-10-25 ENCOUNTER — Encounter (HOSPITAL_COMMUNITY): Payer: Self-pay | Admitting: Family Medicine

## 2020-10-25 DIAGNOSIS — Z113 Encounter for screening for infections with a predominantly sexual mode of transmission: Secondary | ICD-10-CM | POA: Insufficient documentation

## 2020-10-25 DIAGNOSIS — Z3A23 23 weeks gestation of pregnancy: Secondary | ICD-10-CM | POA: Insufficient documentation

## 2020-10-25 DIAGNOSIS — Z711 Person with feared health complaint in whom no diagnosis is made: Secondary | ICD-10-CM

## 2020-10-25 DIAGNOSIS — O0932 Supervision of pregnancy with insufficient antenatal care, second trimester: Secondary | ICD-10-CM | POA: Diagnosis present

## 2020-10-25 LAB — WET PREP, GENITAL
Clue Cells Wet Prep HPF POC: NONE SEEN
Sperm: NONE SEEN
Trich, Wet Prep: NONE SEEN
Yeast Wet Prep HPF POC: NONE SEEN

## 2020-10-25 LAB — URINALYSIS, ROUTINE W REFLEX MICROSCOPIC
Bilirubin Urine: NEGATIVE
Glucose, UA: NEGATIVE mg/dL
Hgb urine dipstick: NEGATIVE
Ketones, ur: NEGATIVE mg/dL
Leukocytes,Ua: NEGATIVE
Nitrite: NEGATIVE
Protein, ur: NEGATIVE mg/dL
Specific Gravity, Urine: 1.015 (ref 1.005–1.030)
pH: 7 (ref 5.0–8.0)

## 2020-10-25 LAB — HEPATITIS C ANTIBODY: HCV Ab: NONREACTIVE

## 2020-10-25 LAB — HEMOGLOBIN A1C
Hgb A1c MFr Bld: 5.5 % (ref 4.8–5.6)
Mean Plasma Glucose: 111.15 mg/dL

## 2020-10-25 LAB — CBC
HCT: 35 % — ABNORMAL LOW (ref 36.0–46.0)
Hemoglobin: 11.3 g/dL — ABNORMAL LOW (ref 12.0–15.0)
MCH: 29 pg (ref 26.0–34.0)
MCHC: 32.3 g/dL (ref 30.0–36.0)
MCV: 90 fL (ref 80.0–100.0)
Platelets: 220 10*3/uL (ref 150–400)
RBC: 3.89 MIL/uL (ref 3.87–5.11)
RDW: 13.8 % (ref 11.5–15.5)
WBC: 10.1 10*3/uL (ref 4.0–10.5)
nRBC: 0 % (ref 0.0–0.2)

## 2020-10-25 LAB — ABO/RH: ABO/RH(D): O POS

## 2020-10-25 LAB — HEPATITIS B SURFACE ANTIGEN: Hepatitis B Surface Ag: NONREACTIVE

## 2020-10-25 LAB — HIV ANTIBODY (ROUTINE TESTING W REFLEX): HIV Screen 4th Generation wRfx: NONREACTIVE

## 2020-10-25 MED ORDER — PREPLUS 27-1 MG PO TABS: 1.0000 | ORAL_TABLET | Freq: Every day | ORAL | 13 refills | Status: DC

## 2020-10-25 NOTE — MAU Note (Signed)
Ellen Camacho is a 35 y.o. at [redacted]w[redacted]d here in MAU reporting: has not been seen yet for this pregnancy, states she has tried to make an appointment. Today she would like to be STD checked and also treated for STD, is having some discharge. Is having some cramping sometimes but none currently. No bleeding or no LOF. +FM  LMP: 05/15/20  Onset of complaint: ongoing  Pain score: 0/10  Vitals:   10/25/20 1428  BP: 117/76  Pulse: 72  Resp: 16  Temp: 98.1 F (36.7 C)  SpO2: 99%     FHT:138  Lab orders placed from triage: none

## 2020-10-25 NOTE — MAU Provider Note (Addendum)
Event Date/Time   First Provider Initiated Contact with Patient 10/25/20 1434      S [name] is a 35 y.o. [id number] at 23.6 weeks by Definite LMP of May 15, 2020 who presents to MAU today without complaints, but concern that she has not received care for this pregnancy.  Patient denies abdominal pain, cramping, or contractions.  She reports denies vaginal discharge, itching, or odor, but requests STD testing because "something's not right."   O BP 117/76 (BP Location: Right Arm)   Pulse 72   Temp 98.1 F (36.7 C) (Oral)   Resp 16   Ht 5\' 6"  (1.676 m)   Wt 64.7 kg   LMP 05/15/2020 (LMP Unknown)   SpO2 99% Comment: room air  BMI 23.03 kg/m  Physical Exam Constitutional:      Appearance: Normal appearance.  HENT:     Head: Normocephalic and atraumatic.  Eyes:     Conjunctiva/sclera: Conjunctivae normal.  Cardiovascular:     Rate and Rhythm: Normal rate.  Pulmonary:     Effort: Pulmonary effort is normal. No respiratory distress.  Abdominal:     Comments: Gravid, Appears LGA  Musculoskeletal:        General: Normal range of motion.     Cervical back: Normal range of motion.  Skin:    General: Skin is warm and dry.  Neurological:     Mental Status: She is alert and oriented to person, place, and time.  Psychiatric:        Mood and Affect: Mood normal.        Behavior: Behavior normal.        Thought Content: Thought content normal.   FHR 138 by doppler  A Medical screening exam complete 07/15/2020 @ 23.6 weeks Physically Well, but Worried No Prenatal Care  P Informed that MAU is not an office and PNC not provided here. Message sent to [facility] for scheduling of NOB appt. Will collect NOB labs and vaginal swabs. Detailed Anatomy [id number] Ordered Informed patient that office will contact her, via phone, mychart, or both, regarding appt. Discharge from MAU in stable condition Patient may return to MAU as needed   Ellen Camacho 10/25/2020 2:34 PM

## 2020-10-26 LAB — GC/CHLAMYDIA PROBE AMP (~~LOC~~) NOT AT ARMC
Chlamydia: NEGATIVE
Comment: NEGATIVE
Comment: NORMAL
Neisseria Gonorrhea: NEGATIVE

## 2020-10-26 LAB — RUBELLA SCREEN: Rubella: 4.71 index (ref >0.99)

## 2020-10-26 LAB — RPR: RPR Ser Ql: NONREACTIVE

## 2020-10-26 LAB — CULTURE, OB URINE: Culture: NO GROWTH

## 2020-11-16 ENCOUNTER — Encounter: Payer: Self-pay | Admitting: Family Medicine

## 2020-11-16 ENCOUNTER — Ambulatory Visit (INDEPENDENT_AMBULATORY_CARE_PROVIDER_SITE_OTHER): Payer: Medicaid Other | Admitting: Family Medicine

## 2020-11-16 ENCOUNTER — Other Ambulatory Visit: Payer: Self-pay

## 2020-11-16 VITALS — BP 107/72 | HR 89 | Wt 149.7 lb

## 2020-11-16 DIAGNOSIS — Z3480 Encounter for supervision of other normal pregnancy, unspecified trimester: Secondary | ICD-10-CM | POA: Diagnosis not present

## 2020-11-16 DIAGNOSIS — O0932 Supervision of pregnancy with insufficient antenatal care, second trimester: Secondary | ICD-10-CM | POA: Diagnosis not present

## 2020-11-16 DIAGNOSIS — O0990 Supervision of high risk pregnancy, unspecified, unspecified trimester: Secondary | ICD-10-CM | POA: Diagnosis not present

## 2020-11-16 DIAGNOSIS — O99333 Smoking (tobacco) complicating pregnancy, third trimester: Secondary | ICD-10-CM | POA: Diagnosis not present

## 2020-11-16 DIAGNOSIS — Z3A26 26 weeks gestation of pregnancy: Secondary | ICD-10-CM

## 2020-11-16 MED ORDER — PREPLUS 27-1 MG PO TABS: 1.0000 | ORAL_TABLET | Freq: Every day | ORAL | 13 refills | Status: DC

## 2020-11-16 NOTE — Progress Notes (Signed)
Subjective:   Ellen Camacho is a 35 y.o. [id number] at [redacted]w[redacted]d by LMP being seen today for her first obstetrical visit.  Her obstetrical history is significant for smoker. Patient does not intend to breast feed. Pregnancy history fully reviewed.  Patient reports no bleeding, no contractions, no cramping, and no leaking.  Reports she had a twin pregnancy 5 years ago and otherwise had no complications in her prior pregnancies. She denies any history of prior surgeries.  HISTORY: OB History  Gravida Para Term Preterm AB Living  7 5 4 1 1 6   SAB IAB Ectopic Multiple Live Births  1 0 0 1 6    # Outcome Date GA Lbr Len/2nd Weight Sex Delivery Anes PTL Lv  7 Current           6 Term 04/24/16 [redacted]w[redacted]d 01:18 / 00:10 7 lb 6 oz (3.345 kg) M Vag-Spont EPI  LIV     Birth Comments: extra digit on right hand     Name: Furness,BOY Melenda     Apgar1: 8  Apgar5: 9  5A Preterm 02/12/15 [redacted]w[redacted]d 08:09 / 00:15 4 lb 6.2 oz (1.99 kg) F Vag-Spont EPI  LIV     Name: JENELLA, CRAIGIE     Apgar1: 10  Apgar5: 10  5B Preterm 02/12/15 [redacted]w[redacted]d 08:09 / 00:50 5 lb 0.1 oz (2.27 kg) M Vag-Spont EPI  LIV     Name: Bonus,BOYB Rosenda     Apgar1: 10  Apgar5: 10  4 Term 12/08/05 [redacted]w[redacted]d   M    LIV  3 Term 11/25/01 [redacted]w[redacted]d   M    LIV  2 Term 12/10/00 [redacted]w[redacted]d   F    LIV  1 SAB            Last pap smear was 11/2014  and was NILM, HPV +, plan for PP PAP  Past Medical History:  Diagnosis Date   Anemia    Headache    HSV infection    Seasonal allergies    STD (female)    Past Surgical History:  Procedure Laterality Date   NO PAST SURGERIES     Family History  Problem Relation Age of Onset   Diabetes Mother    Hypertension Mother    Heart disease Mother    Healthy Father    Stroke Brother    Cancer Maternal Grandfather    Social History   Tobacco Use   Smoking status: Some Days    Packs/day: 0.50    Types: Cigarettes    Last attempt to quit: 07/21/2014    Years since quitting: 6.3   Smokeless tobacco:  Never  Substance Use Topics   Alcohol use: Not Currently    Comment: occ   Drug use: No   No Known Allergies No current outpatient medications on file prior to visit.   No current facility-administered medications on file prior to visit.     Exam   Vitals:   11/16/20 1530  BP: 107/72  Pulse: 89  Weight: 149 lb 11.2 oz (67.9 kg)      System: General: well-developed, well-nourished female in no acute distress   Skin: normal coloration and turgor, no rashes   Neurologic: oriented, normal, negative, normal mood   Extremities: normal strength, tone, and muscle mass, ROM of all joints is normal   HEENT PERRLA, extraocular movement intact and sclera clear, anicteric   Mouth/Teeth mucous membranes moist, pharynx normal without lesions and dental hygiene good   Neck  supple and no masses   Cardiovascular: regular rate and rhythm   Respiratory:  no respiratory distress, normal breath sounds   Abdomen: soft, non-tender; bowel sounds normal; mild outpouching above umbilicus     Assessment:   Pregnancy: [id number] Patient Active Problem List   Diagnosis Date Noted   Supervision of high risk pregnancy, antepartum 11/16/2020   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 Supervision of high risk pregnancy, antepartum 2. [redacted] weeks gestation of pregnancy 3. Limited prenatal care in third trimester Patient presenting for initial prenatal visit. Late presentation to care, had most initial labs done in MAU. Not taking PNV, wants to start now. No other concerns at this time. Anatomy US ordered, still needs to be scheduled.  - Anatomy US scheduled at today's visit - PNV sent to pharmacy - Genetic Screening - [facility]B BABYSCRIPTS SCHEDULE OPTIMIZATION - F/up in 2 weeks  4. Smoking cessation counseling Currently smoking 1 pack a day. Would like to cut back. Discussed goals and would like to cut back by 5 cigarettes a day  starting tomorrow. Would like nicotine gum - Nicotine gum sent to pharmacy  5. Hernia On exam patient with mild outpouching above umbilicus, reducible, no TTP. Likely hernia. No symptoms at this time. Discussed with patient regarding what symptoms to monitor for.   Initial labs drawn. Continue prenatal vitamins. Genetic Screening discussed, Quad screen and NIPS: ordered. Ultrasound discussed; fetal anatomic survey: ordered. Problem list reviewed and updated. The nature of [facility] with multiple MDs and other Advanced Practice Providers was explained to patient; also emphasized that residents, students are part of our team. Routine obstetric precautions reviewed. Return in about 2 weeks (around 11/30/2020) for LROB and  lab visit for 28 week labs including 2 hr GTT.    Warner Mccreedy, MD, MPH OB Fellow, Faculty Practice

## 2020-11-17 LAB — ANTIBODY SCREEN: Antibody Screen: NEGATIVE

## 2020-11-29 ENCOUNTER — Encounter: Payer: Self-pay | Admitting: General Practice

## 2020-12-01 ENCOUNTER — Other Ambulatory Visit: Payer: Medicaid Other

## 2020-12-01 ENCOUNTER — Other Ambulatory Visit: Payer: Self-pay

## 2020-12-01 ENCOUNTER — Encounter: Payer: Medicaid Other | Admitting: Obstetrics and Gynecology

## 2020-12-01 DIAGNOSIS — O0990 Supervision of high risk pregnancy, unspecified, unspecified trimester: Secondary | ICD-10-CM

## 2020-12-02 ENCOUNTER — Telehealth: Payer: Self-pay | Admitting: General Practice

## 2020-12-02 ENCOUNTER — Encounter: Payer: Self-pay | Admitting: Obstetrics and Gynecology

## 2020-12-02 ENCOUNTER — Encounter: Payer: Self-pay | Admitting: General Practice

## 2020-12-02 NOTE — Progress Notes (Signed)
Patient did not keep her OB appointment for 12/01/2020.  Cornelia Copa MD Attending Center for [employer] Midwife)

## 2020-12-02 NOTE — Telephone Encounter (Signed)
Called patient and informed her of [employer]. Phone number provided to set up genetic information session with [employer]. Patient verbalized understanding.

## 2020-12-08 ENCOUNTER — Other Ambulatory Visit: Payer: Self-pay

## 2020-12-08 ENCOUNTER — Ambulatory Visit: Payer: Medicaid Other | Admitting: *Deleted

## 2020-12-08 ENCOUNTER — Ambulatory Visit: Payer: Medicaid Other

## 2020-12-08 ENCOUNTER — Encounter: Payer: Self-pay | Admitting: *Deleted

## 2020-12-08 VITALS — BP 103/62 | HR 86

## 2020-12-08 DIAGNOSIS — O0990 Supervision of high risk pregnancy, unspecified, unspecified trimester: Secondary | ICD-10-CM | POA: Diagnosis present

## 2020-12-08 DIAGNOSIS — Z3A23 23 weeks gestation of pregnancy: Secondary | ICD-10-CM | POA: Insufficient documentation

## 2020-12-08 DIAGNOSIS — O0932 Supervision of pregnancy with insufficient antenatal care, second trimester: Secondary | ICD-10-CM | POA: Insufficient documentation

## 2020-12-08 DIAGNOSIS — Z711 Person with feared health complaint in whom no diagnosis is made: Secondary | ICD-10-CM | POA: Diagnosis not present

## 2020-12-09 ENCOUNTER — Other Ambulatory Visit: Payer: Self-pay | Admitting: *Deleted

## 2020-12-09 DIAGNOSIS — O09523 Supervision of elderly multigravida, third trimester: Secondary | ICD-10-CM

## 2020-12-09 DIAGNOSIS — Z3493 Encounter for supervision of normal pregnancy, unspecified, third trimester: Secondary | ICD-10-CM

## 2020-12-09 DIAGNOSIS — O0933 Supervision of pregnancy with insufficient antenatal care, third trimester: Secondary | ICD-10-CM

## 2020-12-29 ENCOUNTER — Ambulatory Visit: Payer: Medicaid Other | Admitting: *Deleted

## 2020-12-29 ENCOUNTER — Ambulatory Visit: Payer: Medicaid Other | Attending: Maternal & Fetal Medicine

## 2020-12-29 ENCOUNTER — Other Ambulatory Visit: Payer: Self-pay

## 2020-12-29 VITALS — BP 114/77 | HR 77

## 2020-12-29 DIAGNOSIS — O0933 Supervision of pregnancy with insufficient antenatal care, third trimester: Secondary | ICD-10-CM

## 2020-12-29 DIAGNOSIS — O09523 Supervision of elderly multigravida, third trimester: Secondary | ICD-10-CM | POA: Diagnosis present

## 2020-12-29 DIAGNOSIS — O0990 Supervision of high risk pregnancy, unspecified, unspecified trimester: Secondary | ICD-10-CM

## 2020-12-29 DIAGNOSIS — Z3A38 38 weeks gestation of pregnancy: Secondary | ICD-10-CM | POA: Diagnosis not present

## 2020-12-29 DIAGNOSIS — Z3493 Encounter for supervision of normal pregnancy, unspecified, third trimester: Secondary | ICD-10-CM | POA: Diagnosis present

## 2021-01-06 ENCOUNTER — Inpatient Hospital Stay (HOSPITAL_COMMUNITY): Payer: Medicaid Other | Admitting: Anesthesiology

## 2021-01-06 ENCOUNTER — Other Ambulatory Visit: Payer: Self-pay

## 2021-01-06 ENCOUNTER — Encounter (HOSPITAL_COMMUNITY)
Admission: AD | Disposition: A | Discharge status: Home / Self Care | Payer: Self-pay | Source: Home / Self Care | Attending: Family Medicine

## 2021-01-06 ENCOUNTER — Inpatient Hospital Stay (HOSPITAL_COMMUNITY)
Admission: AD | Admit: 2021-01-06 | Discharge: 2021-01-09 | DRG: 787 | Disposition: A | Discharge status: Home / Self Care | Payer: Medicaid Other | Attending: Family Medicine | Admitting: Family Medicine

## 2021-01-06 ENCOUNTER — Encounter (HOSPITAL_COMMUNITY): Payer: Self-pay | Admitting: Obstetrics & Gynecology

## 2021-01-06 DIAGNOSIS — Z20822 Contact with and (suspected) exposure to covid-19: Secondary | ICD-10-CM | POA: Diagnosis present

## 2021-01-06 DIAGNOSIS — R030 Elevated blood-pressure reading, without diagnosis of hypertension: Secondary | ICD-10-CM | POA: Diagnosis present

## 2021-01-06 DIAGNOSIS — O34211 Maternal care for low transverse scar from previous cesarean delivery: Secondary | ICD-10-CM | POA: Diagnosis not present

## 2021-01-06 DIAGNOSIS — Z98891 History of uterine scar from previous surgery: Secondary | ICD-10-CM

## 2021-01-06 DIAGNOSIS — D62 Acute posthemorrhagic anemia: Secondary | ICD-10-CM | POA: Diagnosis not present

## 2021-01-06 DIAGNOSIS — O134 Gestational [pregnancy-induced] hypertension without significant proteinuria, complicating childbirth: Principal | ICD-10-CM | POA: Diagnosis present

## 2021-01-06 DIAGNOSIS — O322XX0 Maternal care for transverse and oblique lie, not applicable or unspecified: Secondary | ICD-10-CM | POA: Diagnosis present

## 2021-01-06 DIAGNOSIS — O99334 Smoking (tobacco) complicating childbirth: Secondary | ICD-10-CM | POA: Diagnosis present

## 2021-01-06 DIAGNOSIS — Z3A39 39 weeks gestation of pregnancy: Secondary | ICD-10-CM | POA: Diagnosis not present

## 2021-01-06 DIAGNOSIS — O9081 Anemia of the puerperium: Secondary | ICD-10-CM | POA: Diagnosis not present

## 2021-01-06 DIAGNOSIS — O403XX0 Polyhydramnios, third trimester, not applicable or unspecified: Secondary | ICD-10-CM | POA: Diagnosis present

## 2021-01-06 DIAGNOSIS — F17210 Nicotine dependence, cigarettes, uncomplicated: Secondary | ICD-10-CM | POA: Diagnosis present

## 2021-01-06 DIAGNOSIS — O0930 Supervision of pregnancy with insufficient antenatal care, unspecified trimester: Secondary | ICD-10-CM

## 2021-01-06 DIAGNOSIS — O0990 Supervision of high risk pregnancy, unspecified, unspecified trimester: Secondary | ICD-10-CM

## 2021-01-06 LAB — URINALYSIS, ROUTINE W REFLEX MICROSCOPIC
Bilirubin Urine: NEGATIVE
Glucose, UA: NEGATIVE mg/dL
Ketones, ur: NEGATIVE mg/dL
Nitrite: NEGATIVE
Protein, ur: NEGATIVE mg/dL
Specific Gravity, Urine: 1.006 (ref 1.005–1.030)
pH: 6 (ref 5.0–8.0)

## 2021-01-06 LAB — COMPREHENSIVE METABOLIC PANEL
ALT: 10 U/L (ref 0–44)
AST: 11 U/L — ABNORMAL LOW (ref 15–41)
Albumin: 2.8 g/dL — ABNORMAL LOW (ref 3.5–5.0)
Alkaline Phosphatase: 179 U/L — ABNORMAL HIGH (ref 38–126)
Anion gap: 9 (ref 5–15)
BUN: 8 mg/dL (ref 6–20)
CO2: 18 mmol/L — ABNORMAL LOW (ref 22–32)
Calcium: 9 mg/dL (ref 8.9–10.3)
Chloride: 107 mmol/L (ref 98–111)
Creatinine, Ser: 0.59 mg/dL (ref 0.44–1.00)
GFR, Estimated: >60 mL/min (ref >60)
Glucose, Bld: 82 mg/dL (ref 70–99)
Potassium: 3.8 mmol/L (ref 3.5–5.1)
Sodium: 134 mmol/L — ABNORMAL LOW (ref 135–145)
Total Bilirubin: 0.2 mg/dL — ABNORMAL LOW (ref 0.3–1.2)
Total Protein: 6.4 g/dL — ABNORMAL LOW (ref 6.5–8.1)

## 2021-01-06 LAB — GROUP B STREP BY PCR: Group B strep by PCR: NEGATIVE

## 2021-01-06 LAB — PROTEIN / CREATININE RATIO, URINE
Creatinine, Urine: 42.56 mg/dL
Protein Creatinine Ratio: 0.23 mg/mg{Cre} — ABNORMAL HIGH (ref 0.00–0.15)
Total Protein, Urine: 10 mg/dL

## 2021-01-06 LAB — TYPE AND SCREEN
ABO/RH(D): O POS
Antibody Screen: NEGATIVE

## 2021-01-06 LAB — CBC
HCT: 37.9 % (ref 36.0–46.0)
Hemoglobin: 12.2 g/dL (ref 12.0–15.0)
MCH: 28.8 pg (ref 26.0–34.0)
MCHC: 32.2 g/dL (ref 30.0–36.0)
MCV: 89.4 fL (ref 80.0–100.0)
Platelets: 243 10*3/uL (ref 150–400)
RBC: 4.24 MIL/uL (ref 3.87–5.11)
RDW: 14.3 % (ref 11.5–15.5)
WBC: 13.8 10*3/uL — ABNORMAL HIGH (ref 4.0–10.5)
nRBC: 0 % (ref 0.0–0.2)

## 2021-01-06 LAB — RESP PANEL BY RT-PCR (FLU A&B, COVID) ARPGX2
Influenza A by PCR: NEGATIVE
Influenza B by PCR: NEGATIVE
SARS Coronavirus 2 by RT PCR: NEGATIVE

## 2021-01-06 SURGERY — Surgical Case
Anesthesia: General | Wound class: Clean Contaminated

## 2021-01-06 MED ORDER — ACETAMINOPHEN 160 MG/5ML PO SOLN: 325.0000 mg | ORAL | Status: DC | PRN

## 2021-01-06 MED ORDER — LACTATED RINGERS IV SOLN: INTRAVENOUS | Status: DC

## 2021-01-06 MED ORDER — SCOPOLAMINE 1 MG/3DAYS TD PT72
MEDICATED_PATCH | TRANSDERMAL | Status: DC | PRN
  Administered 2021-01-06: 1 via TRANSDERMAL

## 2021-01-06 MED ORDER — HYDROMORPHONE HCL 1 MG/ML IJ SOLN
1.0000 mg | Freq: Once | INTRAMUSCULAR | Status: AC
  Administered 2021-01-06: 1 mg via INTRAVENOUS

## 2021-01-06 MED ORDER — DIPHENHYDRAMINE HCL 12.5 MG/5ML PO ELIX
12.5000 mg | ORAL_SOLUTION | Freq: Four times a day (QID) | ORAL | Status: DC | PRN
  Filled 2021-01-06: qty 5

## 2021-01-06 MED ORDER — SCOPOLAMINE 1 MG/3DAYS TD PT72
MEDICATED_PATCH | TRANSDERMAL | Status: AC
  Filled 2021-01-06: qty 1

## 2021-01-06 MED ORDER — FENTANYL CITRATE (PF) 100 MCG/2ML IJ SOLN
INTRAMUSCULAR | Status: AC
  Filled 2021-01-06: qty 2

## 2021-01-06 MED ORDER — CEFAZOLIN SODIUM-DEXTROSE 2-3 GM-%(50ML) IV SOLR
INTRAVENOUS | Status: DC | PRN
  Administered 2021-01-06: 2 g via INTRAVENOUS

## 2021-01-06 MED ORDER — COCONUT OIL OIL: 1.0000 "application " | TOPICAL_OIL | Status: DC | PRN

## 2021-01-06 MED ORDER — DEXAMETHASONE SODIUM PHOSPHATE 4 MG/ML IJ SOLN
INTRAMUSCULAR | Status: AC
  Filled 2021-01-06: qty 2

## 2021-01-06 MED ORDER — OXYCODONE HCL 5 MG PO TABS: 5.0000 mg | ORAL_TABLET | Freq: Once | ORAL | Status: DC | PRN

## 2021-01-06 MED ORDER — HYDROMORPHONE HCL 1 MG/ML IJ SOLN
INTRAMUSCULAR | Status: AC
  Filled 2021-01-06: qty 1

## 2021-01-06 MED ORDER — ENOXAPARIN SODIUM 40 MG/0.4ML IJ SOSY
40.0000 mg | PREFILLED_SYRINGE | INTRAMUSCULAR | Status: DC
  Administered 2021-01-07 – 2021-01-09 (×3): 40 mg via SUBCUTANEOUS
  Filled 2021-01-06 (×3): qty 0.4

## 2021-01-06 MED ORDER — IBUPROFEN 600 MG PO TABS
600.0000 mg | ORAL_TABLET | Freq: Four times a day (QID) | ORAL | Status: DC
  Administered 2021-01-06 – 2021-01-09 (×11): 600 mg via ORAL
  Filled 2021-01-06 (×12): qty 1

## 2021-01-06 MED ORDER — PHENYLEPHRINE 40 MCG/ML (10ML) SYRINGE FOR IV PUSH (FOR BLOOD PRESSURE SUPPORT)
PREFILLED_SYRINGE | INTRAVENOUS | Status: AC
  Filled 2021-01-06: qty 10

## 2021-01-06 MED ORDER — ONDANSETRON HCL 4 MG/2ML IJ SOLN: 4.0000 mg | Freq: Four times a day (QID) | INTRAMUSCULAR | Status: DC | PRN

## 2021-01-06 MED ORDER — STERILE WATER FOR IRRIGATION IR SOLN
Status: DC | PRN
  Administered 2021-01-06: 1000 mL

## 2021-01-06 MED ORDER — ZOLPIDEM TARTRATE 5 MG PO TABS: 5.0000 mg | ORAL_TABLET | Freq: Every evening | ORAL | Status: DC | PRN

## 2021-01-06 MED ORDER — TETANUS-DIPHTH-ACELL PERTUSSIS 5-2.5-18.5 LF-MCG/0.5 IM SUSY: 0.5000 mL | PREFILLED_SYRINGE | Freq: Once | INTRAMUSCULAR | Status: DC

## 2021-01-06 MED ORDER — PRENATAL MULTIVITAMIN CH
1.0000 | ORAL_TABLET | Freq: Every day | ORAL | Status: DC
  Administered 2021-01-07 – 2021-01-09 (×3): 1 via ORAL
  Filled 2021-01-06 (×3): qty 1

## 2021-01-06 MED ORDER — ACETAMINOPHEN 325 MG PO TABS: 325.0000 mg | ORAL_TABLET | ORAL | Status: DC | PRN

## 2021-01-06 MED ORDER — NALOXONE HCL 0.4 MG/ML IJ SOLN
0.4000 mg | INTRAMUSCULAR | Status: DC | PRN
Start: 2021-01-06 — End: 2021-01-07

## 2021-01-06 MED ORDER — PROPOFOL 10 MG/ML IV BOLUS
INTRAVENOUS | Status: DC | PRN
  Administered 2021-01-06: 200 mg via INTRAVENOUS

## 2021-01-06 MED ORDER — ACETAMINOPHEN 325 MG PO TABS: 650.0000 mg | ORAL_TABLET | ORAL | Status: DC | PRN

## 2021-01-06 MED ORDER — LIDOCAINE HCL (CARDIAC) PF 100 MG/5ML IV SOSY
PREFILLED_SYRINGE | INTRAVENOUS | Status: DC | PRN
  Administered 2021-01-06: 100 mg via INTRATRACHEAL

## 2021-01-06 MED ORDER — MIDAZOLAM HCL 2 MG/2ML IJ SOLN
INTRAMUSCULAR | Status: DC | PRN
  Administered 2021-01-06: 2 mg via INTRAVENOUS

## 2021-01-06 MED ORDER — SODIUM CHLORIDE 0.9 % IV SOLN
INTRAVENOUS | Status: DC | PRN
  Administered 2021-01-06: 500 mg via INTRAVENOUS

## 2021-01-06 MED ORDER — LACTATED RINGERS IV SOLN: INTRAVENOUS | Status: DC | PRN

## 2021-01-06 MED ORDER — SOD CITRATE-CITRIC ACID 500-334 MG/5ML PO SOLN: 30.0000 mL | ORAL | Status: DC | PRN

## 2021-01-06 MED ORDER — SODIUM CHLORIDE 0.9 % IR SOLN
Status: DC | PRN
  Administered 2021-01-06: 1000 mL

## 2021-01-06 MED ORDER — SIMETHICONE 80 MG PO CHEW
80.0000 mg | CHEWABLE_TABLET | Freq: Three times a day (TID) | ORAL | Status: DC
  Administered 2021-01-07 – 2021-01-09 (×8): 80 mg via ORAL
  Filled 2021-01-06 (×8): qty 1

## 2021-01-06 MED ORDER — LACTATED RINGERS IV SOLN: 500.0000 mL | INTRAVENOUS | Status: DC | PRN

## 2021-01-06 MED ORDER — DIPHENHYDRAMINE HCL 50 MG/ML IJ SOLN
12.5000 mg | Freq: Four times a day (QID) | INTRAMUSCULAR | Status: DC | PRN
Start: 2021-01-06 — End: 2021-01-07

## 2021-01-06 MED ORDER — DIBUCAINE (PERIANAL) 1 % EX OINT: 1.0000 "application " | TOPICAL_OINTMENT | CUTANEOUS | Status: DC | PRN

## 2021-01-06 MED ORDER — OXYCODONE HCL 5 MG/5ML PO SOLN
5.0000 mg | Freq: Once | ORAL | Status: DC | PRN
Start: 2021-01-06 — End: 2021-01-06

## 2021-01-06 MED ORDER — MORPHINE SULFATE 1 MG/ML IV SOLN PCA
INTRAVENOUS | Status: DC
  Administered 2021-01-07: 6 mg via INTRAVENOUS
  Administered 2021-01-07: 7.5 mg via INTRAVENOUS
  Filled 2021-01-06 (×3): qty 30

## 2021-01-06 MED ORDER — MENTHOL 3 MG MT LOZG: 1.0000 | LOZENGE | OROMUCOSAL | Status: DC | PRN

## 2021-01-06 MED ORDER — DEXAMETHASONE SODIUM PHOSPHATE 4 MG/ML IJ SOLN
INTRAMUSCULAR | Status: DC | PRN
  Administered 2021-01-06: 4 mg via INTRAVENOUS

## 2021-01-06 MED ORDER — FENTANYL CITRATE (PF) 250 MCG/5ML IJ SOLN
INTRAMUSCULAR | Status: AC
  Filled 2021-01-06: qty 5

## 2021-01-06 MED ORDER — DIPHENHYDRAMINE HCL 25 MG PO CAPS: 25.0000 mg | ORAL_CAPSULE | Freq: Four times a day (QID) | ORAL | Status: DC | PRN

## 2021-01-06 MED ORDER — SODIUM CHLORIDE 0.9% FLUSH: 9.0000 mL | INTRAVENOUS | Status: DC | PRN

## 2021-01-06 MED ORDER — ONDANSETRON HCL 4 MG/2ML IJ SOLN
INTRAMUSCULAR | Status: AC
  Filled 2021-01-06: qty 2

## 2021-01-06 MED ORDER — OXYTOCIN-SODIUM CHLORIDE 30-0.9 UT/500ML-% IV SOLN
INTRAVENOUS | Status: AC
  Filled 2021-01-06: qty 500

## 2021-01-06 MED ORDER — TRANEXAMIC ACID-NACL 1000-0.7 MG/100ML-% IV SOLN
INTRAVENOUS | Status: DC | PRN
  Administered 2021-01-06: 1000 mg via INTRAVENOUS

## 2021-01-06 MED ORDER — FENTANYL CITRATE (PF) 100 MCG/2ML IJ SOLN: 50.0000 ug | INTRAMUSCULAR | Status: DC | PRN

## 2021-01-06 MED ORDER — MEPERIDINE HCL 25 MG/ML IJ SOLN: 6.2500 mg | INTRAMUSCULAR | Status: DC | PRN

## 2021-01-06 MED ORDER — SIMETHICONE 80 MG PO CHEW
80.0000 mg | CHEWABLE_TABLET | ORAL | Status: DC | PRN
  Administered 2021-01-08: 80 mg via ORAL
  Filled 2021-01-06: qty 1

## 2021-01-06 MED ORDER — MIDAZOLAM HCL 2 MG/2ML IJ SOLN
INTRAMUSCULAR | Status: AC
  Filled 2021-01-06: qty 2

## 2021-01-06 MED ORDER — ACETAMINOPHEN 10 MG/ML IV SOLN
INTRAVENOUS | Status: DC | PRN
  Administered 2021-01-06: 1000 mg via INTRAVENOUS

## 2021-01-06 MED ORDER — LIDOCAINE HCL (PF) 1 % IJ SOLN: 30.0000 mL | INTRAMUSCULAR | Status: DC | PRN

## 2021-01-06 MED ORDER — FENTANYL CITRATE (PF) 100 MCG/2ML IJ SOLN
25.0000 ug | INTRAMUSCULAR | Status: DC | PRN
  Administered 2021-01-06 (×2): 25 ug via INTRAVENOUS
  Administered 2021-01-06 (×2): 50 ug via INTRAVENOUS

## 2021-01-06 MED ORDER — WITCH HAZEL-GLYCERIN EX PADS: 1.0000 "application " | MEDICATED_PAD | CUTANEOUS | Status: DC | PRN

## 2021-01-06 MED ORDER — OXYTOCIN-SODIUM CHLORIDE 30-0.9 UT/500ML-% IV SOLN: 2.5000 [IU]/h | INTRAVENOUS | Status: DC

## 2021-01-06 MED ORDER — TRANEXAMIC ACID-NACL 1000-0.7 MG/100ML-% IV SOLN
INTRAVENOUS | Status: AC
  Filled 2021-01-06: qty 100

## 2021-01-06 MED ORDER — MORPHINE SULFATE (PF) 0.5 MG/ML IJ SOLN
INTRAMUSCULAR | Status: AC
  Filled 2021-01-06: qty 10

## 2021-01-06 MED ORDER — CEFAZOLIN SODIUM-DEXTROSE 2-4 GM/100ML-% IV SOLN
2.0000 g | Freq: Three times a day (TID) | INTRAVENOUS | Status: DC
  Administered 2021-01-07 (×2): 2 g via INTRAVENOUS
  Filled 2021-01-06 (×2): qty 100

## 2021-01-06 MED ORDER — IBUPROFEN 600 MG PO TABS
600.0000 mg | ORAL_TABLET | ORAL | Status: DC
Start: 2021-01-06 — End: 2021-01-06

## 2021-01-06 MED ORDER — OXYTOCIN-SODIUM CHLORIDE 30-0.9 UT/500ML-% IV SOLN
INTRAVENOUS | Status: DC | PRN
  Administered 2021-01-06: 200 mL via INTRAVENOUS

## 2021-01-06 MED ORDER — PROPOFOL 10 MG/ML IV BOLUS
INTRAVENOUS | Status: AC
  Filled 2021-01-06: qty 20

## 2021-01-06 MED ORDER — OXYTOCIN BOLUS FROM INFUSION: 333.0000 mL | Freq: Once | INTRAVENOUS | Status: DC

## 2021-01-06 MED ORDER — ACETAMINOPHEN 500 MG PO TABS
1000.0000 mg | ORAL_TABLET | Freq: Four times a day (QID) | ORAL | Status: DC
  Administered 2021-01-07 – 2021-01-09 (×9): 1000 mg via ORAL
  Filled 2021-01-06 (×10): qty 2

## 2021-01-06 MED ORDER — HYDROMORPHONE HCL 1 MG/ML IJ SOLN
INTRAMUSCULAR | Status: DC | PRN
  Administered 2021-01-06 (×2): 1 mg via INTRAVENOUS

## 2021-01-06 MED ORDER — SUCCINYLCHOLINE CHLORIDE 200 MG/10ML IV SOSY
PREFILLED_SYRINGE | INTRAVENOUS | Status: DC | PRN
  Administered 2021-01-06: 140 mg via INTRAVENOUS

## 2021-01-06 MED ORDER — SENNOSIDES-DOCUSATE SODIUM 8.6-50 MG PO TABS
2.0000 | ORAL_TABLET | Freq: Every day | ORAL | Status: DC
  Administered 2021-01-07 – 2021-01-09 (×3): 2 via ORAL
  Filled 2021-01-06 (×3): qty 2

## 2021-01-06 MED ORDER — ACETAMINOPHEN 10 MG/ML IV SOLN
INTRAVENOUS | Status: AC
  Filled 2021-01-06: qty 100

## 2021-01-06 MED ORDER — SODIUM CHLORIDE 0.9 % IV SOLN
INTRAVENOUS | Status: AC
  Filled 2021-01-06: qty 500

## 2021-01-06 MED ORDER — ONDANSETRON HCL 4 MG/2ML IJ SOLN: 4.0000 mg | Freq: Once | INTRAMUSCULAR | Status: DC | PRN

## 2021-01-06 MED ORDER — OXYTOCIN-SODIUM CHLORIDE 30-0.9 UT/500ML-% IV SOLN: 2.5000 [IU]/h | INTRAVENOUS | Status: AC

## 2021-01-06 MED ORDER — TERBUTALINE SULFATE 1 MG/ML IJ SOLN: 0.2500 mg | Freq: Once | INTRAMUSCULAR | Status: DC

## 2021-01-06 MED ORDER — FENTANYL CITRATE (PF) 100 MCG/2ML IJ SOLN
INTRAMUSCULAR | Status: DC | PRN
  Administered 2021-01-06: 100 ug via INTRAVENOUS
  Administered 2021-01-06: 150 ug via INTRAVENOUS
  Administered 2021-01-06 (×2): 100 ug via INTRAVENOUS

## 2021-01-06 MED ORDER — ONDANSETRON HCL 4 MG/2ML IJ SOLN
INTRAMUSCULAR | Status: DC | PRN
Start: 2021-01-06 — End: 2021-01-06
  Administered 2021-01-06: 4 mg via INTRAVENOUS

## 2021-01-06 SURGICAL SUPPLY — 33 items
BARRIER ADHS 3X4 INTERCEED (GAUZE/BANDAGES/DRESSINGS) ×4 IMPLANT
BENZOIN TINCTURE PRP APPL 2/3 (GAUZE/BANDAGES/DRESSINGS) ×2 IMPLANT
CLOTH BEACON ORANGE TIMEOUT ST (SAFETY) ×2 IMPLANT
DRSG OPSITE POSTOP 4X10 (GAUZE/BANDAGES/DRESSINGS) ×2 IMPLANT
DRSG OPSITE POSTOP 4X12 (GAUZE/BANDAGES/DRESSINGS) ×2 IMPLANT
ELECT REM PT RETURN 9FT ADLT (ELECTROSURGICAL) ×2
ELECTRODE REM PT RTRN 9FT ADLT (ELECTROSURGICAL) ×1 IMPLANT
EXTRACTOR VACUUM KIWI (MISCELLANEOUS) IMPLANT
GLOVE BIOGEL PI IND STRL 7.0 (GLOVE) ×1 IMPLANT
GLOVE BIOGEL PI INDICATOR 7.0 (GLOVE) ×1
GLOVE SURG ORTHO 8.0 STRL STRW (GLOVE) ×2 IMPLANT
GOWN STRL REUS W/TWL LRG LVL3 (GOWN DISPOSABLE) ×4 IMPLANT
HEMOSTAT ARISTA ABSORB 3G PWDR (HEMOSTASIS) ×2 IMPLANT
KIT ABG SYR 3ML LUER SLIP (SYRINGE) IMPLANT
NEEDLE HYPO 25X5/8 SAFETYGLIDE (NEEDLE) IMPLANT
NS IRRIG 1000ML POUR BTL (IV SOLUTION) ×2 IMPLANT
PACK C SECTION WH (CUSTOM PROCEDURE TRAY) ×2 IMPLANT
PAD OB MATERNITY 4.3X12.25 (PERSONAL CARE ITEMS) ×2 IMPLANT
PENCIL SMOKE EVAC W/HOLSTER (ELECTROSURGICAL) ×2 IMPLANT
RTRCTR C-SECT PINK 25CM LRG (MISCELLANEOUS) IMPLANT
STRIP CLOSURE SKIN 1/2X4 (GAUZE/BANDAGES/DRESSINGS) ×2 IMPLANT
SUT MON AB-0 CT1 36 (SUTURE) ×4 IMPLANT
SUT PLAIN 0 NONE (SUTURE) IMPLANT
SUT VIC AB 0 CT1 27 (SUTURE) ×4
SUT VIC AB 0 CT1 27XBRD ANBCTR (SUTURE) ×2 IMPLANT
SUT VIC AB 2-0 CT1 27 (SUTURE) ×2
SUT VIC AB 2-0 CT1 TAPERPNT 27 (SUTURE) ×1 IMPLANT
SUT VIC AB 4-0 SH 27 (SUTURE) ×2
SUT VIC AB 4-0 SH 27XANBCTRL (SUTURE) ×1 IMPLANT
SYR BULB IRRIG 60ML STRL (SYRINGE) ×2 IMPLANT
TOWEL OR 17X24 6PK STRL BLUE (TOWEL DISPOSABLE) ×2 IMPLANT
TRAY FOLEY W/BAG SLVR 14FR LF (SET/KITS/TRAYS/PACK) ×2 IMPLANT
WATER STERILE IRR 1000ML POUR (IV SOLUTION) ×2 IMPLANT

## 2021-01-06 NOTE — H&P (Signed)
OBSTETRIC ADMISSION HISTORY AND PHYSICAL  Ellen Camacho is a 35 y.o. female [phone] with IUP at [redacted]w[redacted]d by [place] presenting for contractions and found to have elevated blood pressures  and transverse lie of fetus in MAU. She reports +FMs, No LOF, no VB, no blurry vision, headaches or peripheral edema, and RUQ pain.  She plans on bottle feeding. She request Nexplanon for birth control. She received her prenatal care at [facility]   Dating: By [place] --->  Estimated Date of Delivery: 01/11/21  Sono:    @[redacted]w[redacted]d , CWD, normal anatomy, transverse presentation, anterior placental lie, 3383g, 61%ile EFW   Prenatal History/Complications:  Limited prenatal care Polyhydramnios (AFI 33 on on 10/19) Past Medical History: Past Medical History:  Diagnosis Date   Anemia    Headache    HSV infection    Seasonal allergies    STD (female)     Past Surgical History: Past Surgical History:  Procedure Laterality Date   NO PAST SURGERIES      Obstetrical History: OB History     Gravida  7   Para  5   Term  4   Preterm  1   AB  1   Living  6      SAB  1   IAB  0   Ectopic  0   Multiple  1   Live Births  6           Social History Social History   Socioeconomic History   Marital status: Single    Spouse name: Not on file   Number of children: Not on file   Years of education: Not on file   Highest education level: Not on file  Occupational History   Not on file  Tobacco Use   Smoking status: Every Day    Packs/day: 1.00    Types: Cigarettes    Last attempt to quit: 07/21/2014    Years since quitting: 6.4   Smokeless tobacco: Never  Vaping Use   Vaping Use: Never used  Substance and Sexual Activity   Alcohol use: Not Currently    Comment: occ   Drug use: No   Sexual activity: Yes    Birth control/protection: None  Other Topics Concern   Not on file  Social History Narrative   Not on file   Social Determinants of Health   Financial Resource Strain: Not on file   Food Insecurity: Not on file  Transportation Needs: Not on file  Physical Activity: Not on file  Stress: Not on file  Social Connections: Not on file    Family History: Family History  Problem Relation Age of Onset   Diabetes Mother    Hypertension Mother    Heart disease Mother    Healthy Father    Stroke Brother    Cancer Maternal Grandfather     Allergies: No Known Allergies  Medications Prior to Admission  Medication Sig Dispense Refill Last Dose   Prenatal Vit-Fe Fumarate-FA (PREPLUS) 27-1 MG TABS Take 1 tablet by mouth daily. 30 tablet 13 01/05/2021     Review of Systems   All systems reviewed and negative except as stated in HPI  Blood pressure 129/79, pulse 81, temperature 97.7 F (36.5 C), temperature source Oral, resp. rate 19, height 5\' 6"  (1.676 m), weight 70 kg, last menstrual period 05/15/2020, SpO2 99 %, unknown if currently breastfeeding. General appearance: alert Lungs: clear to auscultation bilaterally Heart: regular rate and rhythm Abdomen: soft, non-tender; bowel sounds  normal Extremities: Homans sign is negative, no sign of DVT Presentation:  transverse on BSUS  Fetal monitoringBaseline: 125 bpm, Variability: Good {> 6 bpm), Accelerations: Reactive, and Decelerations: Absent Uterine activityFrequency: Every 1-3 minutes Dilation: 4.5 Effacement (%): 90 Exam by:: Dr. Crissie Reese, MD   Prenatal labs: ABO, Rh: --/--/PENDING (10/27 1404) Antibody: PENDING (10/27 1404) Rubella: 4.71 (08/15 1455) RPR: NON REACTIVE (08/15 1455)  HBsAg: NON REACTIVE (08/15 1455)  HIV: Non Reactive (08/15 1455)  GBS:   unknown 1 hr Glucola early A1C normal, GTT ordered but patient did not come back to get done Genetic screening  LR NIPS Anatomy US normal anatomy, size inconsistent with dates at that time so EDD changed  Prenatal Transfer Tool  Maternal Diabetes: unknown (GTT not done) Genetic Screening: Normal Maternal Ultrasounds/Referrals: Other: Fetal  Ultrasounds or other Referrals:  None Maternal Substance Abuse:  No Significant Maternal Medications:  None Significant Maternal Lab Results: Other: GBS unknown, GTT unknown  Results for orders placed or performed during the hospital encounter of 01/06/21 (from the past 24 hour(s))  Urinalysis, Routine w reflex microscopic Urine, Clean Catch   Collection Time: 01/06/21  1:14 PM  Result Value Ref Range   Color, Urine YELLOW YELLOW   APPearance CLOUDY (A) CLEAR   Specific Gravity, Urine 1.006 1.005 - 1.030   pH 6.0 5.0 - 8.0   Glucose, UA NEGATIVE NEGATIVE mg/dL   Hgb urine dipstick LARGE (A) NEGATIVE   Bilirubin Urine NEGATIVE NEGATIVE   Ketones, ur NEGATIVE NEGATIVE mg/dL   Protein, ur NEGATIVE NEGATIVE mg/dL   Nitrite NEGATIVE NEGATIVE   Leukocytes,Ua MODERATE (A) NEGATIVE   RBC / HPF 11-20 0 - 5 RBC/hpf   WBC, UA 11-20 0 - 5 WBC/hpf   Bacteria, UA FEW (A) NONE SEEN   Squamous Epithelial / LPF 21-50 0 - 5  Type and screen [facility]   Collection Time: 01/06/21  2:04 PM  Result Value Ref Range   ABO/RH(D) PENDING    Antibody Screen PENDING    Sample Expiration      01/09/2021,2359 Performed at [facility], [address] [place], [place] [address]   CBC   Collection Time: 01/06/21  2:20 PM  Result Value Ref Range   WBC 13.8 (H) 4.0 - 10.5 K/uL   RBC 4.24 3.87 - 5.11 MIL/uL   Hemoglobin 12.2 12.0 - 15.0 g/dL   HCT 67.6 19.5 - 09.3 %   MCV 89.4 80.0 - 100.0 fL   MCH 28.8 26.0 - 34.0 pg   MCHC 32.2 30.0 - 36.0 g/dL   RDW 26.7 12.4 - 58.0 %   Platelets 243 150 - 400 K/uL   nRBC 0.0 0.0 - 0.2 %    Patient Active Problem List   Diagnosis Date Noted   Normal labor 01/06/2021   Supervision of high risk pregnancy, antepartum 11/16/2020   Vaginal high risk HPV DNA test positive 11/21/2014   Limited prenatal care, antepartum 10/31/2014    Assessment/Plan:  Ellen Camacho is a 35 y.o. [id number] at [redacted]w[redacted]d here for labor, elevated  BP  #Labor: Found to be transverse Lie on [place] in MAU. External cephalic version attempted by Dr. Despina Hidden and successful. Abdominal binder placed. One has bed on L&D will examine cervix and AROM if appropriate.   #Pain: Need to confirm patient's plan for pain management #FWB: Cat I #ID:  GBS unknown, at term therefore will hold off on antibiotics #MOF: bottle #MOC:Plans Nexplanon #Circ:  N/A  #  gHTN Elevated BP of 129/91 in MAU, rest in 120s-130s/70s-80s. No symptoms. PreE labs pending. - f/up pre E labs  Warner Mccreedy, MD  01/06/2021, 3:17 PM

## 2021-01-06 NOTE — Op Note (Signed)
Ellen Camacho PROCEDURE DATE: 01/06/2021  PREOPERATIVE DIAGNOSES: Intrauterine pregnancy at [redacted]w[redacted]d weeks gestation; malpresentation: transverse presentation after successful version  POSTOPERATIVE DIAGNOSES: The same  PROCEDURE: Primary Low Transverse Cesarean Section  SURGEON:  Dr. Mariel Aloe  ASSISTANT:  Dr. Warner Mccreedy  ANESTHESIOLOGY TEAM: Anesthesiologist: Bethena Midget, MD CRNA: Rhymer, Doree Fudge, CRNA  INDICATIONS: Ellen Camacho is a 35 y.o. [id number] at [redacted]w[redacted]d here for cesarean section secondary to the indications listed under preoperative diagnoses; please see preoperative note for further details. This was an emergent procedure with arm presentation after AROM. The risks of surgery were discussed with the patient including but were not limited to: bleeding which may require transfusion or reoperation; infection which may require antibiotics; injury to bowel, bladder, ureters or other surrounding organs; injury to the fetus; need for additional procedures including hysterectomy in the event of a life-threatening hemorrhage; formation of adhesions; placental abnormalities wth subsequent pregnancies; incisional problems; thromboembolic phenomenon and other postoperative/anesthesia complications.  The patient concurred with the proposed plan, giving informed written consent for the procedure.    FINDINGS:  Viable female infant in cephalic presentation.  Apgars 7 and 9.  Clear amniotic fluid.  Intact placenta, three vessel cord.  Normal uterus, fallopian tubes and ovaries bilaterally.  ANESTHESIA: general INTRAVENOUS FLUIDS: 2100 ml   ESTIMATED BLOOD LOSS: 1113 ml URINE OUTPUT:  100 ml SPECIMENS: Placenta sent to pathology COMPLICATIONS: None immediate  PROCEDURE IN DETAIL:  The patient preoperatively received intravenous antibiotics and had sequential compression devices applied to her lower extremities.   She was then placed in a dorsal supine position with a leftward tilt, and  prepped and draped in a sterile manner.  A foley catheter was placed into her bladder and attached to constant gravity.  She was then taken to the operating room where general anesthesia was administered in sterile fashion.  After an adequate timeout was performed, a Pfannenstiel skin incision was made with scalpel two fingerbreaths above the pubic symphysis and carried through to the underlying layer of fascia. The fascia was incised in the midline, and this incision was extended bilaterally using the Mayo scissors.  Rectus muscles were taken off the fascia with blunt traction.   A similar process was carried out on the inferior aspect of the fascial incision. The rectus muscles were separated in the midline and the peritoneum was entered bluntly. The bladder blade and Richardson speculum were introduced into the abdominal cavity.   Attention was turned to the lower uterine segment where a low transverse hysterotomy was made with a scalpel and extended bilaterally bluntly.   It was difficult to get patient out of tthe transverse position.  The uterus was T'd with an upper uterine midline incision. The infant was successfully delivered, with Dr. Forestine Chute assistance, the cord was immediately cut and the infant was handed over to the awaiting neonatology team. Uterine massage was then administered, and the placenta delivered intact with a three-vessel cord. The uterus was then cleared of clots and debris using manual curettage. The midline incision was closed using multiple layers of 0 vicryl.  The serosa was reapproximated using 3-0 vicryl. The uterine incision was closed with 0 vicryl in a running locked fashion, and an imbricating layer was also placed with 0 vicryl.  Arista was placed over the uterine incision and interceed was placed over the classical incision. The pelvis was cleared of all clot and debris with irrigation and suction. Hemostasis was confirmed on all surfaces.  The retractor was removed.  The  fascia was then closed using 0 Vicryl in a running fashion.  The subcutaneous layer was irrigated and the skin was closed with a 4-0 Vicryl subcuticular stitch. The patient tolerated the procedure well. Sponge, instrument and needle counts were correct x 3.  She was taken to the recovery room in stable condition.    Mariel Aloe, MD, FACOG Obstetrician & Gynecologist, [facility] for [facility], [facility]

## 2021-01-06 NOTE — MAU Note (Signed)
Presents c/o of regular ctxs since 0600 this morning.  Also reports pinkish discharge with wiping.  Endorses +FM.  Denies VB or LOF.

## 2021-01-06 NOTE — Transfer of Care (Signed)
Immediate Anesthesia Transfer of Care Note  Patient: Ellen Camacho  Procedure(s) Performed: CESAREAN SECTION  Patient Location: PACU  Anesthesia Type:General  Level of Consciousness: awake, alert  and oriented  Airway & Oxygen Therapy: Patient Spontanous Breathing  Post-op Assessment: Report given to RN and Post -op Vital signs reviewed and stable  Post vital signs: Reviewed and stable  Last Vitals:  Vitals Value Taken Time  BP 111/66 01/06/21 1730  Temp    Pulse 84 01/06/21 1738  Resp 11 01/06/21 1738  SpO2 100 % 01/06/21 1738  Vitals shown include unvalidated device data.  Last Pain:  Vitals:   01/06/21 1311  TempSrc: Oral  PainSc:          Complications: No notable events documented.

## 2021-01-06 NOTE — Discharge Summary (Addendum)
Postpartum Discharge Summary  Date of Service updated     Patient Name: Ellen Camacho DOB: 23-Apr-1985 MRN: [id number]  Date of admission: 01/06/2021 Delivery date:01/06/2021  Delivering provider: Griffin Basil  Date of discharge: 01/09/2021  Admitting diagnosis: Normal labor [O80, Z37.9] Intrauterine pregnancy: [redacted]w[redacted]d     Secondary diagnosis:  Active Problems:   Limited prenatal care, antepartum   Supervision of high risk pregnancy, antepartum   Normal labor   H/O cesarean section  Additional problems: Acute post-operative blood loss anemia    Discharge diagnosis: Term Pregnancy Delivered and Gestational Hypertension                                              Post partum procedures: IV venofer Augmentation: AROM Complications: [id number]  Hospital course: Induction of Labor With Cesarean Section   35 y.o. yo [id number] at [redacted]w[redacted]d was admitted to the hospital 01/06/2021 for induction of labor. Patient had a labor course significant for patient was initially transverse presentation, an external cephalic version was attempted in PACU which was successful, and then upon arrival to L&D patient was found to be complete with bulging bag. However at AROM, there was a significant amount of meconium stained fluid, an elbow and funic presentation The patient went for cesarean section due to Malpresentation and although there was no cord prolapse it was an elbow and funic presentation . Delivery details are as follows: Membrane Rupture Time/Date: 3:41 PM ,01/06/2021   Delivery Method:C-Section, Low Transverse  Details of operation can be found in separate operative Note.  Patient had an uncomplicated postpartum course. Her hemoglobin decreased to 7.1 postoperatively, declined RBCs but received IV venofer. Asymptomatic on day of discharge. She is ambulating, tolerating a regular diet, passing flatus, and urinating well.  Patient is discharged home in stable condition on 01/19/21.       Newborn Data: Birth date:01/06/2021  Birth time:4:00 PM  Gender:Female  Living status:Living  Apgars:7 ,9  Weight:3.2 kg                                Magnesium Sulfate received: No BMZ received: No Rhophylac:N/A MMR:N/A T-DaP: Declined  Flu: N/A Transfusion:No  Physical exam  Vitals:   01/07/21 1418 01/08/21 0403 01/08/21 1503 01/08/21 1944  BP: 122/80 106/62 108/70 109/66  Pulse: 80 71 76 75  Resp: $Remo'18 16 17 16  'eYOuN$ Temp: 98.3 F (36.8 C) 97.7 F (36.5 C) 97.9 F (36.6 C) 98.4 F (36.9 C)  TempSrc: Oral Axillary Oral Oral  SpO2: 99% 100%  100%  Weight:      Height:       General: alert, cooperative, and no distress Lochia: appropriate Uterine Fundus: firm Incision: Healing well with no significant drainage, Dressing is clean, dry, and intact DVT Evaluation: No significant calf/ankle edema. Labs: Lab Results  Component Value Date   WBC 15.1 (H) 01/07/2021   HGB 7.1 (L) 01/07/2021   HCT 21.9 (L) 01/07/2021   MCV 89.4 01/07/2021   PLT 179 01/07/2021   CMP Latest Ref Rng & Units 01/06/2021  Glucose 70 - 99 mg/dL 82  BUN 6 - 20 mg/dL 8  Creatinine 0.44 - 1.00 mg/dL 0.59  Sodium 135 - 145 mmol/L 134(L)  Potassium 3.5 - 5.1 mmol/L 3.8  Chloride 98 - 111 mmol/L  107  CO2 22 - 32 mmol/L 18(L)  Calcium 8.9 - 10.3 mg/dL 9.0  Total Protein 6.5 - 8.1 g/dL 6.4(L)  Total Bilirubin 0.3 - 1.2 mg/dL 0.2(L)  Alkaline Phos 38 - 126 U/L 179(H)  AST 15 - 41 U/L 11(L)  ALT 0 - 44 U/L 10   Edinburgh Score: Edinburgh Postnatal Depression Scale Screening Tool 01/14/2021  I have been able to laugh and see the funny side of things. 0  I have looked forward with enjoyment to things. 0  I have blamed myself unnecessarily when things went wrong. 1  I have been anxious or worried for no good reason. 0  I have felt scared or panicky for no good reason. 1  Things have been getting on top of me. 1  I have been so unhappy that I have had difficulty sleeping. 1  I have felt sad or  miserable. 0  I have been so unhappy that I have been crying. 1  The thought of harming myself has occurred to me. 0  Edinburgh Postnatal Depression Scale Total 5     After visit meds:  Allergies as of 01/09/2021   No Known Allergies      Medication List     TAKE these medications    acetaminophen 500 MG tablet Commonly known as: TYLENOL Take 2 tablets (1,000 mg total) by mouth every 6 (six) hours.   ferrous sulfate 325 (65 FE) MG tablet Take 1 tablet (325 mg total) by mouth every other day.   Gabapentin 50 MG Tabs Take 300 mg by mouth 2 (two) times daily.   ibuprofen 600 MG tablet Commonly known as: ADVIL Take 1 tablet (600 mg total) by mouth every 6 (six) hours as needed for moderate pain, cramping or mild pain.   Oxycodone HCl 10 MG Tabs Take 1 tablet (10 mg total) by mouth every 6 (six) hours as needed for severe pain.   polyethylene glycol 17 g packet Commonly known as: MIRALAX / GLYCOLAX Take 17 g by mouth 2 (two) times daily.   PrePLUS 27-1 MG Tabs Take 1 tablet by mouth daily.         Discharge home in stable condition Infant Feeding: Bottle Infant Disposition:home with mother Discharge instruction: per After Visit Summary and Postpartum booklet. Activity: Advance as tolerated. Pelvic rest for 6 weeks.  Diet: routine diet Future Appointments: Future Appointments  Date Time Provider [place]  01/28/2021 10:20 AM [facility] NURSE [facility]  02/11/2021  8:15 AM Tresea Mall, CNM [facility]   Follow up Visit:  [place] [facility] at [facility] Follow up.   Specialty: Obstetrics and Gynecology Contact information: [place] [id number] [phone]               Message sent to [facility] by Dr. Cy Blamer on 01/06/2021  Please schedule this patient for a In person postpartum visit in 4 weeks with the following provider: MD. Additional Postpartum  F/U:2 hour GTT, Incision check 1 week, and BP check 1 week  High risk pregnancy complicated by:  polyhydramnios, limited prenatal care, and no GTT done  Delivery mode:  C-Section, Low Transverse  Anticipated Birth Control:   Desires Nexplanon   01/19/2021 Patriciaann Clan, DO

## 2021-01-06 NOTE — Anesthesia Postprocedure Evaluation (Signed)
Anesthesia Post Note  Patient: Ellen Camacho  Procedure(s) Performed: CESAREAN SECTION     Patient location during evaluation: PACU Anesthesia Type: General Level of consciousness: awake and alert Pain management: pain level controlled Vital Signs Assessment: post-procedure vital signs reviewed and stable Respiratory status: spontaneous breathing, nonlabored ventilation, respiratory function stable and patient connected to nasal cannula oxygen Cardiovascular status: blood pressure returned to baseline and stable Postop Assessment: no apparent nausea or vomiting Anesthetic complications: no   No notable events documented.  Last Vitals:  Vitals:   01/06/21 1401 01/06/21 1730  BP: 129/79 111/66  Pulse: 81 82  Resp:  16  Temp:  (!) 36.4 C  SpO2: 99% 100%    Last Pain:  Vitals:   01/06/21 1730  TempSrc: Axillary  PainSc: 6    Pain Goal:                   Lashina Milles

## 2021-01-06 NOTE — MAU Note (Signed)
Dicky Doe, Consulting civil engineer called and stated the PACU is ready for patient.

## 2021-01-06 NOTE — Anesthesia Preprocedure Evaluation (Signed)
Anesthesia Evaluation  Patient identified by MRN, date of birth, ID band Patient awake    Reviewed: Allergy & Precautions, H&P , NPO status , Patient's Chart, lab work & pertinent test results, reviewed documented beta blocker date and time   Airway Mallampati: I  TM Distance: >3 FB Neck ROM: full    Dental no notable dental hx. (+) Teeth Intact, Dental Advisory Given   Pulmonary neg pulmonary ROS, Current Smoker,    Pulmonary exam normal breath sounds clear to auscultation       Cardiovascular Exercise Tolerance: Good negative cardio ROS   Rhythm:regular Rate:Normal     Neuro/Psych  Headaches, negative psych ROS   GI/Hepatic negative GI ROS, Neg liver ROS,   Endo/Other  negative endocrine ROS  Renal/GU negative Renal ROS  negative genitourinary   Musculoskeletal   Abdominal   Peds  Hematology  (+) Blood dyscrasia, anemia ,   Anesthesia Other Findings   Reproductive/Obstetrics negative OB ROS                             Anesthesia Physical Anesthesia Plan  ASA: 3 and emergent  Anesthesia Plan: General   Post-op Pain Management:    Induction: Cricoid pressure planned, Rapid sequence and Intravenous  PONV Risk Score and Plan: 3 and Ondansetron  Airway Management Planned: Oral ETT and Video Laryngoscope Planned  Additional Equipment:   Intra-op Plan:   Post-operative Plan: Extubation in OR  Informed Consent: I have reviewed the patients History and Physical, chart, labs and discussed the procedure including the risks, benefits and alternatives for the proposed anesthesia with the patient or authorized representative who has indicated his/her understanding and acceptance.     Dental Advisory Given  Plan Discussed with: CRNA and Anesthesiologist  Anesthesia Plan Comments:         Anesthesia Quick Evaluation

## 2021-01-06 NOTE — Anesthesia Procedure Notes (Signed)
Procedure Name: Intubation Date/Time: 01/06/2021 3:53 PM Performed by: Elbert Ewings, CRNA Pre-anesthesia Checklist: Patient identified, Patient being monitored, Timeout performed, Emergency Drugs available and Suction available Patient Re-evaluated:Patient Re-evaluated prior to induction Oxygen Delivery Method: Circle System Utilized Preoxygenation: Pre-oxygenation with 100% oxygen Induction Type: IV induction Ventilation: Mask ventilation without difficulty Laryngoscope Size: 3 and Glidescope Grade View: Grade II Tube type: Oral Tube size: 7.0 mm Number of attempts: 1 Airway Equipment and Method: stylet and Stylet Placement Confirmation: ETT inserted through vocal cords under direct vision, positive ETCO2 and breath sounds checked- equal and bilateral Secured at: 21 cm Tube secured with: Tape Dental Injury: Teeth and Oropharynx as per pre-operative assessment

## 2021-01-07 LAB — CBC
HCT: 21.9 % — ABNORMAL LOW (ref 36.0–46.0)
Hemoglobin: 7.1 g/dL — ABNORMAL LOW (ref 12.0–15.0)
MCH: 29 pg (ref 26.0–34.0)
MCHC: 32.4 g/dL (ref 30.0–36.0)
MCV: 89.4 fL (ref 80.0–100.0)
Platelets: 179 10*3/uL (ref 150–400)
RBC: 2.45 MIL/uL — ABNORMAL LOW (ref 3.87–5.11)
RDW: 14.2 % (ref 11.5–15.5)
WBC: 15.1 10*3/uL — ABNORMAL HIGH (ref 4.0–10.5)
nRBC: 0 % (ref 0.0–0.2)

## 2021-01-07 LAB — RPR: RPR Ser Ql: NONREACTIVE

## 2021-01-07 MED ORDER — OXYCODONE HCL 5 MG PO TABS
10.0000 mg | ORAL_TABLET | Freq: Four times a day (QID) | ORAL | Status: DC | PRN
  Administered 2021-01-08 – 2021-01-09 (×5): 10 mg via ORAL
  Filled 2021-01-07 (×5): qty 2

## 2021-01-07 MED ORDER — OXYCODONE HCL 5 MG PO TABS: 5.0000 mg | ORAL_TABLET | Freq: Four times a day (QID) | ORAL | Status: DC | PRN

## 2021-01-07 MED ORDER — OXYCODONE HCL 5 MG PO TABS
10.0000 mg | ORAL_TABLET | Freq: Four times a day (QID) | ORAL | Status: AC
  Administered 2021-01-07 – 2021-01-08 (×2): 10 mg via ORAL
  Filled 2021-01-07 (×2): qty 2

## 2021-01-07 MED ORDER — SODIUM CHLORIDE 0.9 % IV SOLN
500.0000 mg | Freq: Once | INTRAVENOUS | Status: DC
  Filled 2021-01-07: qty 25

## 2021-01-07 MED ORDER — OXYCODONE HCL 5 MG PO TABS
5.0000 mg | ORAL_TABLET | Freq: Four times a day (QID) | ORAL | Status: DC
  Administered 2021-01-07 (×2): 5 mg via ORAL
  Filled 2021-01-07 (×3): qty 1

## 2021-01-07 NOTE — Progress Notes (Signed)
Patient requested to have PCA taken off. I told patient to use Incentive spirometer. Patient told the plan of care . Patient is not dizzy but agreed to Venofer transfusion . Dr. Ephriam Jenkins aware of patient's status.

## 2021-01-07 NOTE — Progress Notes (Addendum)
POD:1 Subjective: Patient is subjectively feeling well, pain controlled with PCA. Has only gotten out of bed once, but felt fine ambulating. Did not have any dizziness when walking. Feels a burning pain in left hip. Has not passed flatus yet and is urinating through foley. She denies SOB, Chest pain, Nausea, vomiting, or calf pain.  Discussed with patient that given her Hgb trend from 12>7.1 recommended packed red blood cells. At this time patient would like to defer blood transfusion but is amenable to iron transfusion Objective: Blood pressure 105/70, pulse 62, temperature 97.7 F (36.5 C), temperature source Oral, resp. rate 18, height 5\' 6"  (1.676 m), weight 70 kg, last menstrual period 05/15/2020, SpO2 100 %, unknown if currently breastfeeding.  Physical Exam:  General: alert and no distress Lochia: Appropriate Uterine Fundus: firm Incision: Blood visible through bandage. DVT Evaluation: No evidence of DVT seen on physical exam.  Recent Labs    01/06/21 1420 01/07/21 0559  HGB 12.2 7.1*  HCT 37.9 21.9*    Assessment/Plan: Ellen Camacho is a 35 y.o. is a 35 on POD1 after C-section.  Acute Blood Loss Anemia: Asymptomatic, but anemia to 7.1 with visible blood over incision.  -IV Venofer  -Continue to monitor symptoms  Pain: Currently controlled with PCA, will discontinue today and transition to scheduled oral meds for 24 hours.   Social Work Consult: [id number] requested, Librarian, academic. Community education officer Birth Control: Desires Nexplanon    LOS: 1 day   [id number] 01/07/2021, 8:48 AM   Patient ID: 01/09/2021, female   DOB: 20-Feb-1986, 35 y.o.   MRN: [id number]   GME ATTESTATION:  I saw and evaluated the patient. I agree with the findings and the plan of care as documented in the student's note and have made all necessary edits  [id number], MD, MPH OB Fellow, [facility], [facility] 01/07/2021 2:09 PM

## 2021-01-07 NOTE — Progress Notes (Signed)
MD called to let them know that only half of the bag was given of Venofer due to it being to irritating on the vein per mom. Mom is walking without dizzyness and pain is now controlled. Patient voiding fine.

## 2021-01-07 NOTE — Progress Notes (Signed)
Patient states she is  not  dizzy just feels bloated. I encouraged her to walk the halls she has not passed gas yet but bowel sounds present.
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
## 2021-01-08 MED ORDER — ETONOGESTREL 68 MG ~~LOC~~ IMPL
68.0000 mg | DRUG_IMPLANT | Freq: Once | SUBCUTANEOUS | Status: AC
  Administered 2021-01-08: 68 mg via SUBCUTANEOUS
  Filled 2021-01-08: qty 1

## 2021-01-08 MED ORDER — LIDOCAINE HCL 1 % IJ SOLN
0.0000 mL | Freq: Once | INTRAMUSCULAR | Status: AC | PRN
  Administered 2021-01-08: 2 mL via INTRADERMAL
  Filled 2021-01-08: qty 20

## 2021-01-08 MED ORDER — GABAPENTIN 600 MG PO TABS
300.0000 mg | ORAL_TABLET | Freq: Two times a day (BID) | ORAL | Status: DC
  Administered 2021-01-08 – 2021-01-09 (×3): 300 mg via ORAL
  Filled 2021-01-08 (×4): qty 0.5

## 2021-01-08 MED ORDER — POLYETHYLENE GLYCOL 3350 17 G PO PACK
17.0000 g | PACK | Freq: Two times a day (BID) | ORAL | Status: DC
  Administered 2021-01-08 – 2021-01-09 (×3): 17 g via ORAL
  Filled 2021-01-08 (×3): qty 1

## 2021-01-08 NOTE — Progress Notes (Addendum)
Patient ID: Ellen Camacho, female   DOB: May 25, 1985, 35 y.o.   MRN: [id number]  POSTPARTUM PROGRESS NOTE  Post Partum Day 2  Subjective:  Ellen Camacho is a 35 y.o. [id number] s/p primary C/S at [redacted]w[redacted]d.  No acute events overnight.  Pt denies problems with ambulating, voiding or po intake.  She denies nausea or vomiting.  Pain is moderately controlled. She reports burning sensation at C/s incision.   She has had flatus. She has had bowel movement.  Lochia Minimal. She is requesting something to help with BM.   Objective: Blood pressure 106/62, pulse 71, temperature 97.7 F (36.5 C), temperature source Axillary, resp. rate 16, height 5\' 6"  (1.676 m), weight 70 kg, last menstrual period 05/15/2020, SpO2 100 %, unknown if currently breastfeeding.  Physical Exam:  General: alert, cooperative and no distress Chest: no respiratory distress Heart:regular rate, distal pulses intact Abdomen: soft, nontender,  Uterine Fundus: firm, appropriately tender DVT Evaluation: No calf swelling or tenderness Extremities: negative edema Skin: warm, dry.  Dressing clean, dry, intact, honey comb in place.   Recent Labs    01/06/21 1420 01/07/21 0559  HGB 12.2 7.1*  HCT 37.9 21.9*    Assessment/Plan: Ellen Camacho is a 35 y.o. 30 s/p primary C/S at [redacted]w[redacted]d; emergent C/s for funic presentation.   PPD# 2- Doing well Contraception: Nexplanon. RN to call me on Vocera when tray is at bedside.  Feeding: Bottle/ Breast  Dispo: Plan for discharge tomorrow # Status post Venofer  # Miralax ordered  #Gabapentin for incision pain, 300 mg BID    LOS: 2 days   [redacted]w[redacted]d I, NP 01/08/2021, 9:35 AM

## 2021-01-09 MED ORDER — IBUPROFEN 600 MG PO TABS: 600.0000 mg | ORAL_TABLET | Freq: Four times a day (QID) | ORAL | 0 refills | Status: DC | PRN

## 2021-01-09 MED ORDER — OXYCODONE HCL 10 MG PO TABS: 10.0000 mg | ORAL_TABLET | Freq: Four times a day (QID) | ORAL | 0 refills | Status: DC | PRN

## 2021-01-09 MED ORDER — ACETAMINOPHEN 500 MG PO TABS: 1000.0000 mg | ORAL_TABLET | Freq: Four times a day (QID) | ORAL | 0 refills | Status: DC

## 2021-01-09 MED ORDER — POLYETHYLENE GLYCOL 3350 17 G PO PACK: 17.0000 g | PACK | Freq: Two times a day (BID) | ORAL | 0 refills | Status: DC

## 2021-01-09 MED ORDER — FERROUS SULFATE 325 (65 FE) MG PO TABS: 325.0000 mg | ORAL_TABLET | ORAL | 0 refills | Status: DC

## 2021-01-09 MED ORDER — GABAPENTIN 50 MG PO TABS: 300.0000 mg | ORAL_TABLET | Freq: Two times a day (BID) | ORAL | 0 refills | Status: DC

## 2021-01-11 LAB — SURGICAL PATHOLOGY

## 2021-01-14 ENCOUNTER — Ambulatory Visit (INDEPENDENT_AMBULATORY_CARE_PROVIDER_SITE_OTHER): Payer: Medicaid Other

## 2021-01-14 ENCOUNTER — Other Ambulatory Visit: Payer: Self-pay

## 2021-01-14 VITALS — BP 121/96 | HR 64 | Wt 142.4 lb

## 2021-01-14 DIAGNOSIS — Z0130 Encounter for examination of blood pressure without abnormal findings: Secondary | ICD-10-CM

## 2021-01-14 DIAGNOSIS — Z5189 Encounter for other specified aftercare: Secondary | ICD-10-CM

## 2021-01-14 DIAGNOSIS — R030 Elevated blood-pressure reading, without diagnosis of hypertension: Secondary | ICD-10-CM

## 2021-01-14 MED ORDER — AMLODIPINE BESYLATE 5 MG PO TABS: 5.0000 mg | ORAL_TABLET | Freq: Every day | ORAL | 0 refills | Status: DC

## 2021-01-14 NOTE — Progress Notes (Signed)
Blood Pressure Check Visit  Ellen Camacho is here for blood pressure check following primary c-section on 10/27. BP today is 121/96, HR 64. Reports swelling in bilateral legs and mild headache for past 2 days. Currently endorses headache in front of head at 6/10. Steri strips removed from incision. Incision is clean, dry, and intact with well approximated edges. Raised area to left side of incision.  Pt is bottle feeding. Reports experiencing fullness and hardness in both breasts. Recommended pt hand express enough milk to be comfortable, but no more. Included instructions in wrap up for OTC medication to help dry up milk supply.  Debroah Loop, MD to bedside to see patient. Verbal order given for Norvasc 5 mg daily. Pt to return in 2 weeks for BP and incision check. Debroah Loop, MD states incision appears to be well healing. Reviewed s/s of infection and good wound care with patient. Patient to monitor incision at home and call office with any changes.  Marjo Bicker, RN 01/14/2021  10:41 AM

## 2021-01-14 NOTE — Patient Instructions (Signed)
To decrease milk production:   Pick-up pseudoephedrine 60 mg from pharmacy over the counter. Take daily for 5 days. You may also try supplement called Remus Blake.

## 2021-01-18 ENCOUNTER — Telehealth (HOSPITAL_COMMUNITY): Payer: Self-pay

## 2021-01-18 NOTE — Telephone Encounter (Signed)
No answer. Mailbox is full.   Ellen Camacho 01/18/2021,1613

## 2021-01-28 ENCOUNTER — Ambulatory Visit: Payer: Medicaid Other

## 2021-02-11 ENCOUNTER — Ambulatory Visit: Payer: Self-pay | Admitting: Advanced Practice Midwife

## 2021-03-16 ENCOUNTER — Encounter (HOSPITAL_COMMUNITY): Payer: Self-pay | Admitting: Obstetrics and Gynecology

## 2021-03-24 ENCOUNTER — Ambulatory Visit (HOSPITAL_COMMUNITY)
Admission: EM | Admit: 2021-03-24 | Discharge: 2021-03-24 | Disposition: A | Discharge status: Home / Self Care | Payer: Medicaid Other | Attending: Internal Medicine | Admitting: Internal Medicine

## 2021-03-24 ENCOUNTER — Other Ambulatory Visit: Payer: Self-pay

## 2021-03-24 ENCOUNTER — Encounter (HOSPITAL_COMMUNITY): Payer: Self-pay

## 2021-03-24 DIAGNOSIS — Z202 Contact with and (suspected) exposure to infections with a predominantly sexual mode of transmission: Secondary | ICD-10-CM | POA: Diagnosis not present

## 2021-03-24 DIAGNOSIS — Z1152 Encounter for screening for COVID-19: Secondary | ICD-10-CM | POA: Insufficient documentation

## 2021-03-24 DIAGNOSIS — J069 Acute upper respiratory infection, unspecified: Secondary | ICD-10-CM | POA: Diagnosis present

## 2021-03-24 DIAGNOSIS — Z113 Encounter for screening for infections with a predominantly sexual mode of transmission: Secondary | ICD-10-CM | POA: Insufficient documentation

## 2021-03-24 LAB — SARS CORONAVIRUS 2 (TAT 6-24 HRS): SARS Coronavirus 2: NEGATIVE

## 2021-03-24 LAB — POCT RAPID STREP A, ED / UC: Streptococcus, Group A Screen (Direct): NEGATIVE

## 2021-03-24 MED ORDER — DOXYCYCLINE HYCLATE 100 MG PO TABS: 100.0000 mg | ORAL_TABLET | Freq: Two times a day (BID) | ORAL | 7 refills | Status: DC

## 2021-03-24 MED ORDER — CEFTRIAXONE SODIUM 500 MG IJ SOLR
500.0000 mg | Freq: Once | INTRAMUSCULAR | Status: AC
  Administered 2021-03-24: 500 mg via INTRAMUSCULAR

## 2021-03-24 MED ORDER — LIDOCAINE HCL (PF) 1 % IJ SOLN
INTRAMUSCULAR | Status: AC
  Filled 2021-03-24: qty 2

## 2021-03-24 MED ORDER — CEFTRIAXONE SODIUM 500 MG IJ SOLR
INTRAMUSCULAR | Status: AC
  Filled 2021-03-24: qty 500

## 2021-03-24 NOTE — Discharge Instructions (Addendum)
Your rapid strep test today was negative.  I will send this to be cultured and notify you of any further treatment is recommended.  We have also screened you for COVID-19.  As we discussed, your results should be available in 1 to 2 days on your MyChart.  In the meantime you will need to isolate.  If test is positive continue isolation for 5 days as long as you remain fever free.  You can use Tylenol and/or Motrin as needed for fever or pain, salt water gargles, over-the-counter cetirizine and Flonase may also be helpful for any congestion.  Increase fluid intake.  I went ahead and treated you today for gonorrhea.  That injection should actually cover should you have strep infection in your throat.  I am also sending over a prescription prescription for doxycycline.  We will reach out to you should your testing indicate you need any further treatment.  In the meantime avoid sexual activity until completion of antibiotics  Use follow-up for any persistent or worsening symptoms

## 2021-03-24 NOTE — ED Triage Notes (Signed)
Pt presents to urgent care for sore throat and body ache x 2 days. She would like to have STI testing today with blood work.

## 2021-03-24 NOTE — ED Provider Notes (Signed)
[facility]    CSN: [id number] Arrival date & time: 03/24/21  7106      History   Chief Complaint Chief Complaint  Patient presents with   Sore Throat    STI testing     HPI Ellen Camacho is a 36 y.o. female Sore throat, body aches started yesterday, slight HA, chills No congestion, no CP, SOB, no abd pain, n/v/d.  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 Authorizing Provider  doxycycline (VIBRA-TABS) 100 MG tablet Take 1 tablet (100 mg total) by mouth 2 (two) times daily. 03/24/21  Yes Rolla Etienne, NP  acetaminophen (TYLENOL) 500 MG tablet Take 2 tablets (1,000 mg total) by mouth every 6 (six) hours. Patient not taking: Reported on 01/14/2021 01/09/21   Allayne Stack, DO  amLODipine (NORVASC) 5 MG tablet Take 1 tablet (5 mg total) by mouth daily. 01/14/21   Adam Phenix, MD  ferrous sulfate 325 (65 FE) MG tablet Take 1 tablet (325 mg total) by mouth  every other day. Patient not taking: Reported on 01/14/2021 01/09/21 01/09/22  Allayne Stack, DO  gabapentin 50 MG TABS Take 300 mg by mouth 2 (two) times daily. Patient not taking: Reported on 01/14/2021 01/09/21   Allayne Stack, DO  ibuprofen (ADVIL) 600 MG tablet Take 1 tablet (600 mg total) by mouth every 6 (six) hours as needed for moderate pain, cramping or mild pain. 01/09/21   Allayne Stack, DO  oxyCODONE 10 MG TABS Take 1 tablet (10 mg total) by mouth every 6 (six) hours as needed for severe pain. 01/09/21   Allayne Stack, DO  polyethylene glycol (MIRALAX / GLYCOLAX) 17 g packet Take 17 g by mouth 2 (two) times daily. Patient not taking: Reported on 01/14/2021 01/09/21   Allayne Stack, DO  Prenatal Vit-Fe Fumarate-FA (PREPLUS) 27-1 MG TABS Take 1 tablet by mouth daily. Patient not taking: Reported on 01/14/2021 11/16/20   Warner Mccreedy, MD    Family History Family History  Problem Relation Age of Onset   Diabetes Mother    Hypertension Mother    Heart disease Mother  Healthy Father    Stroke Brother    Cancer Maternal Grandfather     Social History Social History   Tobacco Use   Smoking status: Every Day    Packs/day: 1.00    Types: Cigarettes    Last attempt to quit: 07/21/2014    Years since quitting: 6.6   Smokeless tobacco: Never  Vaping Use   Vaping Use: Never used  Substance Use Topics   Alcohol use: Not Currently    Comment: occ   Drug use: No     Allergies   Patient has no known allergies.   Review of Systems As stated in HPI otherwise negative   Physical Exam Triage Vital Signs ED Triage Vitals [03/24/21 0821]  Enc Vitals Group     BP 130/90     Pulse Rate (!) 101     Resp 16     Temp 99.6 F (37.6 C)     Temp Source Oral     SpO2 100 %     Weight      Height      Head Circumference      Peak Flow      Pain Score      Pain Loc      Pain Edu?      Excl. in GC?    No data found.  Updated Vital Signs BP 130/90 (BP  Location: Left Arm)    Pulse (!) 101    Temp 99.6 F (37.6 C) (Oral)    Resp 16    LMP 03/24/2021    SpO2 100%   Visual Acuity Right Eye Distance:   Left Eye Distance:   Bilateral Distance:    Right Eye Near:   Left Eye Near:    Bilateral Near:     Physical Exam Constitutional:      General: She is not in acute distress.    Appearance: She is well-developed. She is not ill-appearing or toxic-appearing.  HENT:     Right Ear: Tympanic membrane normal. No middle ear effusion. Tympanic membrane is not erythematous.     Left Ear: Tympanic membrane normal.  No middle ear effusion. Tympanic membrane is not erythematous.     Nose: No congestion or rhinorrhea.     Mouth/Throat:     Mouth: Mucous membranes are moist.     Pharynx: No oropharyngeal exudate or uvula swelling.     Tonsils: Tonsillar exudate present. No tonsillar abscesses. 2+ on the right. 2+ on the left.  Eyes:     Conjunctiva/sclera: Conjunctivae normal.  Cardiovascular:     Rate and Rhythm: Normal rate and regular rhythm.     Heart sounds: No murmur heard.   No friction rub. No gallop.  Pulmonary:     Effort: Pulmonary effort is normal.     Breath sounds: Normal breath sounds. No wheezing, rhonchi or rales.  Abdominal:     General: Bowel sounds are normal.     Palpations: Abdomen is soft.     Tenderness: There is no abdominal tenderness. There is no guarding.  Musculoskeletal:     Cervical back: Normal range of motion.  Lymphadenopathy:     Cervical: No cervical adenopathy.  Skin:    General: Skin is warm and dry.  Neurological:     Mental Status: She is alert and oriented to person, place, and time.  Psychiatric:        Mood and Affect: Mood normal.        Behavior: Behavior  normal.     UC Treatments / Results  Labs (all labs ordered are listed, but only abnormal results are displayed) Labs Reviewed  SARS CORONAVIRUS 2 (TAT 6-24 HRS)  POCT RAPID STREP A, ED / UC  CERVICOVAGINAL ANCILLARY ONLY     EKG   Radiology No results found.  Procedures Procedures (including critical care time)  Medications Ordered in UC Medications  cefTRIAXone (ROCEPHIN) injection 500 mg (500 mg Intramuscular Given 03/24/21 0905)    Initial Impression / Assessment and Plan / UC Course  I have reviewed the triage vital signs and the nursing notes.  Pertinent labs & imaging results that were available during my care of the patient were reviewed by me and considered in my medical decision making (see chart for details).  Viral URI with cough -Symptoms ongoing x2 days, VSS, nontoxic appearing -Rapid strep negative.  Will send for culture.  Treatment for below issue should cover -Suspect acute viral illness, possible influenza though patient not interested in Tamiflu.  Consider COVID-19.  PCR testing sent with strict isolation and follow-up precautions -Symptomatic treatment as discussed  STI treatment -Patient requesting treatment today due to known exposure -IM Rocephin, DC on Doxy twice daily x7 days -Follow-up on culture to determine need for further treatment  Reviewed expections re: course of current medical issues. Questions answered. Outlined signs and symptoms indicating need for more acute intervention. Pt verbalized understanding. AVS given  Final Clinical Impressions(s) / UC Diagnoses   Final diagnoses:  Viral upper respiratory tract infection  Routine screening for STI (sexually transmitted infection)  Encounter for screening for COVID-19     Discharge Instructions      Your rapid strep test today was negative.  I will send this to be cultured and notify you of any further treatment is recommended.  We have also screened you for COVID-19.  As we discussed, your results should be available in 1 to 2 days on your MyChart.  In the meantime you will need to isolate.  If test is positive continue isolation for 5 days as long as you remain fever free.  You can use Tylenol and/or  Motrin as needed for fever or pain, salt water gargles, over-the-counter cetirizine and Flonase may also be helpful for any congestion.  Increase fluid intake.  I went ahead and treated you today for gonorrhea.  That injection should actually cover should you have strep infection in your throat.  I am also sending over a prescription prescription for doxycycline.  We will reach out to you should your testing indicate you need any further treatment.  In the meantime avoid sexual activity until completion of antibiotics  Use follow-up for any persistent or worsening symptoms       ED Prescriptions     Medication Sig Dispense Auth. Provider   doxycycline (VIBRA-TABS) 100 MG tablet Take 1 tablet (100 mg total) by mouth 2 (two) times daily. 14 tablet Rolla Etienne, NP      PDMP not reviewed this encounter.   Rolla Etienne, NP 03/24/21 [phone]

## 2021-03-25 LAB — CERVICOVAGINAL ANCILLARY ONLY
Bacterial Vaginitis (gardnerella): NEGATIVE
Chlamydia: NEGATIVE
Comment: NEGATIVE
Comment: NEGATIVE
Comment: NEGATIVE
Comment: NORMAL
Neisseria Gonorrhea: NEGATIVE
Trichomonas: NEGATIVE

## 2021-08-23 ENCOUNTER — Ambulatory Visit (HOSPITAL_COMMUNITY)
Admission: EM | Admit: 2021-08-23 | Discharge: 2021-08-23 | Disposition: A | Discharge status: Home / Self Care | Payer: Medicaid Other | Attending: Physician Assistant | Admitting: Physician Assistant

## 2021-08-23 ENCOUNTER — Encounter (HOSPITAL_COMMUNITY): Payer: Self-pay | Admitting: Emergency Medicine

## 2021-08-23 DIAGNOSIS — L301 Dyshidrosis [pompholyx]: Secondary | ICD-10-CM | POA: Diagnosis not present

## 2021-08-23 DIAGNOSIS — Z3202 Encounter for pregnancy test, result negative: Secondary | ICD-10-CM

## 2021-08-23 DIAGNOSIS — Z202 Contact with and (suspected) exposure to infections with a predominantly sexual mode of transmission: Secondary | ICD-10-CM | POA: Insufficient documentation

## 2021-08-23 DIAGNOSIS — Z113 Encounter for screening for infections with a predominantly sexual mode of transmission: Secondary | ICD-10-CM | POA: Diagnosis present

## 2021-08-23 LAB — POCT URINALYSIS DIPSTICK, ED / UC
Glucose, UA: NEGATIVE mg/dL
Hgb urine dipstick: NEGATIVE
Leukocytes,Ua: NEGATIVE
Nitrite: NEGATIVE
Protein, ur: 100 mg/dL — AB
Specific Gravity, Urine: 1.025 (ref 1.005–1.030)
Urobilinogen, UA: 0.2 mg/dL (ref 0.0–1.0)
pH: 5.5 (ref 5.0–8.0)

## 2021-08-23 LAB — POC URINE PREG, ED: Preg Test, Ur: NEGATIVE

## 2021-08-23 LAB — HEPATITIS C ANTIBODY: HCV Ab: NONREACTIVE

## 2021-08-23 LAB — HIV ANTIBODY (ROUTINE TESTING W REFLEX): HIV Screen 4th Generation wRfx: NONREACTIVE

## 2021-08-23 MED ORDER — TRIAMCINOLONE ACETONIDE 0.1 % EX CREA: 1.0000 "application " | TOPICAL_CREAM | Freq: Two times a day (BID) | CUTANEOUS | 0 refills | Status: DC

## 2021-08-23 NOTE — ED Notes (Signed)
"  Un-discharged patient" for lab to result in chart.  Lab resulted test.  Patient removed from chart rack

## 2021-08-23 NOTE — ED Provider Notes (Signed)
[place]    CSN: [id number] Arrival date & time: 08/23/21  1244      History   Chief Complaint Chief Complaint  Patient presents with   Exposure to STD    HPI Ellen Camacho is a 36 y.o. female.   Patient presents today with several concerns.  Her primary concern today is STI testing.  She denies any known exposure but does report that her significant other is often unfaithful and so she is interested in complete STI testing.  She denies any abdominal pain, pelvic pain, fever, nausea, vomiting.  She does have some mild dysuria but denies any frequency/urgency or additional symptoms.  She has been seen at the health department in the past and has developed a pruritic scaling rash on her hands and so is requesting testing for syphilis.  Denies any recent antibiotics.  Denies any recent urogenital procedure or self-catheterization.  Denies history of recurrent UTI.  In addition, patient reports a several week history of scaling.  Rash on bilateral palmar surfaces.  She does report regular exposure to moisture.  Denies any changes to personal hygiene products including soaps or detergents.  She has not tried any over-the-counter medication for symptom management.  She denies history of eczema or dermatological condition.  She is able to perform daily activities despite symptoms.    Past Medical History:  Diagnosis Date   Anemia    Headache    HSV infection    Seasonal allergies    STD (female)     Patient Active Problem List   Diagnosis Date Noted   Normal labor 01/06/2021   H/O cesarean section 01/06/2021   Supervision of high risk pregnancy, antepartum 11/16/2020   Vaginal high risk HPV DNA test positive 11/21/2014   Limited prenatal care, antepartum 10/31/2014    Past Surgical History:  Procedure Laterality Date   CESAREAN SECTION  01/06/2021   Procedure: CESAREAN SECTION;  Surgeon: Griffin Basil, MD;  Location: [facility] LD ORS;  Service: Obstetrics;;   NO  PAST SURGERIES      OB History     Gravida  7   Para  6   Term  5   Preterm  1   AB  1   Living  7      SAB  1   IAB  0   Ectopic  0   Multiple  1   Live Births  7            Home Medications    Prior to Admission medications   Medication Sig Start Date End Date Taking? Authorizing Provider  triamcinolone cream (KENALOG) 0.1 % Apply 1 application  topically 2 (two) times daily. 08/23/21  Yes Alexcia Schools K, PA-C  Etonogestrel (NEXPLANON [place]) Inject into the skin.    [provider]    Family History Family History  Problem Relation Age of Onset   Diabetes Mother    Hypertension Mother    Heart disease Mother    Healthy Father    Stroke Brother    Cancer Maternal Grandfather     Social History Social History   Tobacco Use   Smoking status: Every Day    Packs/day: 1.00    Types: Cigarettes    Last attempt to quit: 07/21/2014    Years since quitting: 7.0   Smokeless tobacco: Never  Vaping Use   Vaping Use: Never used  Substance Use Topics   Alcohol use: Not Currently  Comment: occ   Drug use: No     Allergies   Patient has no known allergies.   Review of Systems Review of Systems  Constitutional:  Negative for activity change, appetite change, fatigue and fever.  Gastrointestinal:  Negative for abdominal pain, diarrhea, nausea and vomiting.  Genitourinary:  Positive for dysuria. Negative for frequency, pelvic pain, urgency, vaginal bleeding, vaginal discharge and vaginal pain.  Skin:  Positive for rash.  Neurological:  Negative for dizziness, light-headedness and headaches.     Physical Exam Triage Vital Signs ED Triage Vitals  Enc Vitals Group     BP 08/23/21 1316 126/81     Pulse Rate 08/23/21 1316 90     Resp 08/23/21 1316 16     Temp 08/23/21 1316 98.1 F (36.7 C)     Temp Source 08/23/21 1316 Oral     SpO2 08/23/21 1316 98 %     Weight --      Height --      Head Circumference --      Peak Flow --       Pain Score 08/23/21 1319 0     Pain Loc --      Pain Edu? --      Excl. in [place]? --    No data found.  Updated Vital Signs BP 126/81 (BP Location: Right Arm)   Pulse 90   Temp 98.1 F (36.7 C) (Oral)   Resp 16   LMP 08/10/2021 (Approximate)   SpO2 98%   Breastfeeding No   Visual Acuity Right Eye Distance:   Left Eye Distance:   Bilateral Distance:    Right Eye Near:   Left Eye Near:    Bilateral Near:     Physical Exam Vitals reviewed.  Constitutional:      General: She is awake. She is not in acute distress.    Appearance: Normal appearance. She is well-developed. She is not ill-appearing.     Comments: Very pleasant female appears stated age in no acute distress sitting comfortably in exam room  HENT:     Head: Normocephalic and atraumatic.  Cardiovascular:     Rate and Rhythm: Normal rate and regular rhythm.     Heart sounds: Normal heart sounds, S1 normal and S2 normal. No murmur heard. Pulmonary:     Effort: Pulmonary effort is normal.     Breath sounds: Normal breath sounds. No wheezing, rhonchi or rales.     Comments: Clear to auscultation bilaterally Abdominal:     General: Bowel sounds are normal.     Palpations: Abdomen is soft.     Tenderness: There is no abdominal tenderness. There is no right CVA tenderness, left CVA tenderness, guarding or rebound.     Comments: Benign abdominal exam  Skin:    Findings: Rash present. Rash is scaling.     Comments: Scaling rash noted palmar surface of bilateral hands.  No hyperpigmented macules or additional lesion.  Psychiatric:        Behavior: Behavior is cooperative.       UC Treatments / Results  Labs (all labs ordered are listed, but only abnormal results are displayed) Labs Reviewed  POCT URINALYSIS DIPSTICK, ED / UC - Abnormal; Notable for the following components:      Result Value   Bilirubin Urine SMALL (*)    Ketones, ur TRACE (*)    Protein, ur 100 (*)    All other components within normal  limits  URINE CULTURE  RPR  HIV ANTIBODY (ROUTINE TESTING W REFLEX)  HEPATITIS C ANTIBODY  POC URINE PREG, ED  CERVICOVAGINAL ANCILLARY ONLY    EKG   Radiology No results found.  Procedures Procedures (including critical care time)  Medications Ordered in UC Medications - No data to display  Initial Impression / Assessment and Plan / UC Course  I have reviewed the triage vital signs and the nursing notes.  Pertinent labs & imaging results that were available during my care of the patient were reviewed by me and considered in my medical decision making (see chart for details).     Complete STI panel including cervical vaginal swab for gonorrhea, chlamydia, trichomonas, bacterial vaginosis, yeast as well as blood testing for HIV/hepatitis/syphilis obtained today.  Discussed that rash on hands is not consistent with typical syphilis rash so we will defer penicillin treatment until lab work is available.  (See below for information about dyshidrotic eczema).  She denies any additional symptoms we will defer treatment until results are available.  UA was obtained with no evidence of infection.  Given intermittent dysuria we will send this off for culture but defer antibiotics until results are available.  She was encouraged to eat small frequent meals and drink plenty of fluid.  Urine pregnancy was negative.  She is to wear hypoallergenic soaps and detergents and wear loosefitting cotton underwear.  Discussed that if she develops any additional symptoms she should be reevaluated.  Strict return precautions given to which she expressed understanding.  Rash consistent with dyshidrotic eczema.  Patient was encouraged to avoid prolonged exposure to moisture and keep hands clean.  She can use triamcinolone for additional symptom relief.  Discussed that if her rash changes in any way or she develops additional symptoms she should return for reevaluation to which she expressed  understanding.  Final Clinical Impressions(s) / UC Diagnoses   Final diagnoses:  Possible exposure to STD  Screen for STD (sexually transmitted disease)  Dyshidrotic eczema     Discharge Instructions      Follow your MyChart for your results.  We will contact you if anything is positive and we need to arrange any treatment.  Abstain from sex until you receive the results.  It is important to use a condom with each sexual encounter.  If you develop any symptoms including abdominal pain, fever, urinary symptoms, pelvic pain, vaginal discharge to be seen again.  I believe that your rash on your hands is related to eczema.  Please avoid prolonged exposure to moisture and keep your hands clean.  Use triamcinolone twice daily as needed.  If your symptoms are worsening or changing please return for reevaluation.     ED Prescriptions     Medication Sig Dispense Auth. Provider   triamcinolone cream (KENALOG) 0.1 % Apply 1 application  topically 2 (two) times daily. 30 g Helyne Genther, Derry Skill, PA-C      PDMP not reviewed this encounter.   Terrilee Croak, PA-C 08/23/21 1522

## 2021-08-23 NOTE — ED Triage Notes (Signed)
Patient c/o STD exposure.  Patient denies vaginal discharge. Patient denies ABD pain.   Patient endorses dysuria.   Patient endorses possible exposure to Syphilis.   Patient endorses rash present on hands. Patient endorses itchiness.   Patient endorses chills at times.

## 2021-08-23 NOTE — Discharge Instructions (Signed)
Follow your MyChart for your results.  We will contact you if anything is positive and we need to arrange any treatment.  Abstain from sex until you receive the results.  It is important to use a condom with each sexual encounter.  If you develop any symptoms including abdominal pain, fever, urinary symptoms, pelvic pain, vaginal discharge to be seen again.  I believe that your rash on your hands is related to eczema.  Please avoid prolonged exposure to moisture and keep your hands clean.  Use triamcinolone twice daily as needed.  If your symptoms are worsening or changing please return for reevaluation.

## 2021-08-24 ENCOUNTER — Telehealth (HOSPITAL_COMMUNITY): Payer: Self-pay | Admitting: Emergency Medicine

## 2021-08-24 LAB — CERVICOVAGINAL ANCILLARY ONLY
Bacterial Vaginitis (gardnerella): POSITIVE — AB
Candida Glabrata: NEGATIVE
Candida Vaginitis: POSITIVE — AB
Chlamydia: NEGATIVE
Comment: NEGATIVE
Comment: NEGATIVE
Comment: NEGATIVE
Comment: NEGATIVE
Comment: NEGATIVE
Comment: NORMAL
Neisseria Gonorrhea: NEGATIVE
Trichomonas: NEGATIVE

## 2021-08-24 LAB — URINE CULTURE: Culture: NO GROWTH

## 2021-08-24 LAB — RPR: RPR Ser Ql: NONREACTIVE

## 2021-08-24 MED ORDER — METRONIDAZOLE 500 MG PO TABS: 500.0000 mg | ORAL_TABLET | Freq: Two times a day (BID) | ORAL | 0 refills | Status: DC

## 2021-08-24 MED ORDER — FLUCONAZOLE 150 MG PO TABS: 150.0000 mg | ORAL_TABLET | Freq: Once | ORAL | 0 refills | Status: AC

## 2021-08-30 ENCOUNTER — Encounter (HOSPITAL_COMMUNITY): Payer: Self-pay | Admitting: Emergency Medicine

## 2021-10-02 ENCOUNTER — Emergency Department (HOSPITAL_COMMUNITY): Payer: Medicaid Other

## 2021-10-02 ENCOUNTER — Encounter (HOSPITAL_COMMUNITY): Payer: Self-pay | Admitting: *Deleted

## 2021-10-02 ENCOUNTER — Other Ambulatory Visit: Payer: Self-pay

## 2021-10-02 ENCOUNTER — Emergency Department (HOSPITAL_COMMUNITY)
Admission: EM | Admit: 2021-10-02 | Discharge: 2021-10-02 | Disposition: A | Discharge status: Home / Self Care | Payer: Medicaid Other | Attending: Emergency Medicine | Admitting: Emergency Medicine

## 2021-10-02 DIAGNOSIS — S20213A Contusion of bilateral front wall of thorax, initial encounter: Secondary | ICD-10-CM | POA: Insufficient documentation

## 2021-10-02 DIAGNOSIS — S8392XA Sprain of unspecified site of left knee, initial encounter: Secondary | ICD-10-CM | POA: Insufficient documentation

## 2021-10-02 DIAGNOSIS — S8992XA Unspecified injury of left lower leg, initial encounter: Secondary | ICD-10-CM | POA: Diagnosis present

## 2021-10-02 DIAGNOSIS — S1093XA Contusion of unspecified part of neck, initial encounter: Secondary | ICD-10-CM | POA: Insufficient documentation

## 2021-10-02 DIAGNOSIS — S20219A Contusion of unspecified front wall of thorax, initial encounter: Secondary | ICD-10-CM

## 2021-10-02 LAB — URINALYSIS, ROUTINE W REFLEX MICROSCOPIC
Bilirubin Urine: NEGATIVE
Glucose, UA: NEGATIVE mg/dL
Ketones, ur: NEGATIVE mg/dL
Leukocytes,Ua: NEGATIVE
Nitrite: NEGATIVE
Protein, ur: 30 mg/dL — AB
Specific Gravity, Urine: 1.017 (ref 1.005–1.030)
pH: 5 (ref 5.0–8.0)

## 2021-10-02 LAB — RAPID URINE DRUG SCREEN, HOSP PERFORMED
Amphetamines: NOT DETECTED
Barbiturates: NOT DETECTED
Benzodiazepines: NOT DETECTED
Cocaine: POSITIVE — AB
Opiates: NOT DETECTED
Tetrahydrocannabinol: POSITIVE — AB

## 2021-10-02 LAB — PREGNANCY, URINE: Preg Test, Ur: NEGATIVE

## 2021-10-02 MED ORDER — CYCLOBENZAPRINE HCL 5 MG PO TABS: 5.0000 mg | ORAL_TABLET | Freq: Three times a day (TID) | ORAL | 0 refills | Status: DC | PRN

## 2021-10-02 MED ORDER — OXYCODONE-ACETAMINOPHEN 5-325 MG PO TABS
2.0000 | ORAL_TABLET | Freq: Once | ORAL | Status: DC
  Filled 2021-10-02: qty 2

## 2021-10-02 NOTE — Progress Notes (Signed)
Orthopedic Tech Progress Note Patient Details:  Ellen Camacho March 11, 1986 [id number]  Ortho Devices Type of Ortho Device: Knee Sleeve Ortho Device/Splint Location: lle Ortho Device/Splint Interventions: Ordered, Application, Adjustment   Post Interventions Patient Tolerated: Well Instructions Provided: Care of device, Adjustment of device  Amor[place], [place] 10/02/2021, 7:55 PM

## 2021-10-02 NOTE — ED Notes (Signed)
Pt from registration to room 15 via w/c with/by [employer]. Pt alert,NAD, calm, interactive, resps e/u.
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
## 2021-10-02 NOTE — ED Notes (Signed)
EDP into room, at BS.  ?
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
## 2021-12-26 ENCOUNTER — Emergency Department (HOSPITAL_COMMUNITY)
Admission: EM | Admit: 2021-12-26 | Discharge: 2021-12-27 | Disposition: A | Discharge status: Home / Self Care | Payer: Medicaid Other | Attending: Emergency Medicine | Admitting: Emergency Medicine

## 2021-12-26 ENCOUNTER — Emergency Department (HOSPITAL_COMMUNITY): Payer: Medicaid Other

## 2021-12-26 DIAGNOSIS — Y908 Blood alcohol level of 240 mg/100 ml or more: Secondary | ICD-10-CM | POA: Diagnosis not present

## 2021-12-26 DIAGNOSIS — F1910 Other psychoactive substance abuse, uncomplicated: Secondary | ICD-10-CM | POA: Diagnosis not present

## 2021-12-26 DIAGNOSIS — E876 Hypokalemia: Secondary | ICD-10-CM | POA: Insufficient documentation

## 2021-12-26 DIAGNOSIS — F10920 Alcohol use, unspecified with intoxication, uncomplicated: Secondary | ICD-10-CM

## 2021-12-26 DIAGNOSIS — R45851 Suicidal ideations: Secondary | ICD-10-CM | POA: Diagnosis not present

## 2021-12-26 DIAGNOSIS — R4182 Altered mental status, unspecified: Secondary | ICD-10-CM | POA: Diagnosis present

## 2021-12-26 DIAGNOSIS — Z20822 Contact with and (suspected) exposure to covid-19: Secondary | ICD-10-CM | POA: Insufficient documentation

## 2021-12-26 DIAGNOSIS — F10129 Alcohol abuse with intoxication, unspecified: Secondary | ICD-10-CM | POA: Insufficient documentation

## 2021-12-26 DIAGNOSIS — R451 Restlessness and agitation: Secondary | ICD-10-CM | POA: Diagnosis not present

## 2021-12-26 LAB — COMPREHENSIVE METABOLIC PANEL
ALT: 19 U/L (ref 0–44)
AST: 23 U/L (ref 15–41)
Albumin: 4 g/dL (ref 3.5–5.0)
Alkaline Phosphatase: 96 U/L (ref 38–126)
Anion gap: 9 (ref 5–15)
BUN: 11 mg/dL (ref 6–20)
CO2: 22 mmol/L (ref 22–32)
Calcium: 8.8 mg/dL — ABNORMAL LOW (ref 8.9–10.3)
Chloride: 107 mmol/L (ref 98–111)
Creatinine, Ser: 0.96 mg/dL (ref 0.44–1.00)
GFR, Estimated: >60 mL/min (ref >60)
Glucose, Bld: 116 mg/dL — ABNORMAL HIGH (ref 70–99)
Potassium: 2.9 mmol/L — ABNORMAL LOW (ref 3.5–5.1)
Sodium: 138 mmol/L (ref 135–145)
Total Bilirubin: 0.2 mg/dL — ABNORMAL LOW (ref 0.3–1.2)
Total Protein: 7 g/dL (ref 6.5–8.1)

## 2021-12-26 LAB — CBC WITH DIFFERENTIAL/PLATELET
Abs Immature Granulocytes: 0.03 10*3/uL (ref 0.00–0.07)
Basophils Absolute: 0 10*3/uL (ref 0.0–0.1)
Basophils Relative: 0 %
Eosinophils Absolute: 0.3 10*3/uL (ref 0.0–0.5)
Eosinophils Relative: 3 %
HCT: 39.9 % (ref 36.0–46.0)
Hemoglobin: 13 g/dL (ref 12.0–15.0)
Immature Granulocytes: 0 %
Lymphocytes Relative: 25 %
Lymphs Abs: 2.5 10*3/uL (ref 0.7–4.0)
MCH: 28.6 pg (ref 26.0–34.0)
MCHC: 32.6 g/dL (ref 30.0–36.0)
MCV: 87.9 fL (ref 80.0–100.0)
Monocytes Absolute: 0.7 10*3/uL (ref 0.1–1.0)
Monocytes Relative: 7 %
Neutro Abs: 6.5 10*3/uL (ref 1.7–7.7)
Neutrophils Relative %: 65 %
Platelets: 288 10*3/uL (ref 150–400)
RBC: 4.54 MIL/uL (ref 3.87–5.11)
RDW: 13.6 % (ref 11.5–15.5)
WBC: 10.1 10*3/uL (ref 4.0–10.5)
nRBC: 0 % (ref 0.0–0.2)

## 2021-12-26 LAB — I-STAT VENOUS BLOOD GAS, ED
Acid-base deficit: 5 mmol/L — ABNORMAL HIGH (ref 0.0–2.0)
Bicarbonate: 21.5 mmol/L (ref 20.0–28.0)
Calcium, Ion: 1.16 mmol/L (ref 1.15–1.40)
HCT: 41 % (ref 36.0–46.0)
Hemoglobin: 13.9 g/dL (ref 12.0–15.0)
O2 Saturation: 89 %
Potassium: 3.2 mmol/L — ABNORMAL LOW (ref 3.5–5.1)
Sodium: 143 mmol/L (ref 135–145)
TCO2: 23 mmol/L (ref 22–32)
pCO2, Ven: 43.4 mmHg — ABNORMAL LOW (ref 44–60)
pH, Ven: 7.303 (ref 7.25–7.43)
pO2, Ven: 62 mmHg — ABNORMAL HIGH (ref 32–45)

## 2021-12-26 LAB — URINALYSIS, ROUTINE W REFLEX MICROSCOPIC
Bacteria, UA: NONE SEEN
Bilirubin Urine: NEGATIVE
Glucose, UA: NEGATIVE mg/dL
Ketones, ur: NEGATIVE mg/dL
Leukocytes,Ua: NEGATIVE
Nitrite: NEGATIVE
Protein, ur: NEGATIVE mg/dL
Specific Gravity, Urine: 1.004 — ABNORMAL LOW (ref 1.005–1.030)
pH: 5 (ref 5.0–8.0)

## 2021-12-26 LAB — SALICYLATE LEVEL: Salicylate Lvl: <7 mg/dL — ABNORMAL LOW (ref 7.0–30.0)

## 2021-12-26 LAB — ACETAMINOPHEN LEVEL: Acetaminophen (Tylenol), Serum: <10 ug/mL — ABNORMAL LOW (ref 10–30)

## 2021-12-26 LAB — ETHANOL: Alcohol, Ethyl (B): 321 mg/dL (ref <10)

## 2021-12-26 LAB — RAPID URINE DRUG SCREEN, HOSP PERFORMED
Amphetamines: NOT DETECTED
Barbiturates: NOT DETECTED
Benzodiazepines: POSITIVE — AB
Cocaine: POSITIVE — AB
Opiates: NOT DETECTED
Tetrahydrocannabinol: POSITIVE — AB

## 2021-12-26 LAB — I-STAT BETA HCG BLOOD, ED (MC, WL, AP ONLY): I-stat hCG, quantitative: <5 m[IU]/mL (ref <5)

## 2021-12-26 LAB — CBG MONITORING, ED: Glucose-Capillary: 113 mg/dL — ABNORMAL HIGH (ref 70–99)

## 2021-12-26 LAB — AMMONIA: Ammonia: 23 umol/L (ref 9–35)

## 2021-12-26 LAB — MAGNESIUM: Magnesium: 2.3 mg/dL (ref 1.7–2.4)

## 2021-12-26 MED ORDER — HALOPERIDOL LACTATE 5 MG/ML IJ SOLN
5.0000 mg | Freq: Once | INTRAMUSCULAR | Status: AC
  Administered 2021-12-26: 5 mg via INTRAVENOUS
  Filled 2021-12-26: qty 1

## 2021-12-26 MED ORDER — DIPHENHYDRAMINE HCL 50 MG/ML IJ SOLN
25.0000 mg | Freq: Once | INTRAMUSCULAR | Status: AC
  Administered 2021-12-26: 25 mg via INTRAVENOUS
  Filled 2021-12-26: qty 1

## 2021-12-26 MED ORDER — LORAZEPAM 2 MG/ML IJ SOLN
1.0000 mg | Freq: Once | INTRAMUSCULAR | Status: AC
  Administered 2021-12-26: 1 mg via INTRAVENOUS
  Filled 2021-12-26: qty 1

## 2021-12-26 MED ORDER — LACTATED RINGERS IV BOLUS
1000.0000 mL | Freq: Once | INTRAVENOUS | Status: AC
  Administered 2021-12-26: 1000 mL via INTRAVENOUS

## 2021-12-26 MED ORDER — NALOXONE HCL 0.4 MG/ML IJ SOLN: 0.4000 mg | INTRAMUSCULAR | Status: DC | PRN
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
## 2021-12-27 ENCOUNTER — Other Ambulatory Visit: Payer: Self-pay

## 2021-12-27 ENCOUNTER — Encounter (HOSPITAL_COMMUNITY): Payer: Self-pay

## 2021-12-27 LAB — SARS CORONAVIRUS 2 BY RT PCR: SARS Coronavirus 2 by RT PCR: NEGATIVE

## 2021-12-27 NOTE — ED Notes (Signed)
Assisted to bedside commode. Gait very unstable. Changed from wet clothes to hospital provided burgundy scrubs. Belongings gathered and placed in pt specific bag. [place]onslip socks placed. Paced in bed and both rails raised. Call light at bedside.

## 2021-12-27 NOTE — ED Notes (Signed)
Patient awake and given water. Patient tolerating fine. Patient ambulatory to restroom and out of department. Provider notified
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
## 2022-02-10 ENCOUNTER — Other Ambulatory Visit: Payer: Self-pay

## 2022-02-10 ENCOUNTER — Emergency Department (HOSPITAL_COMMUNITY)
Admission: EM | Admit: 2022-02-10 | Discharge: 2022-02-10 | Disposition: A | Discharge status: Home / Self Care | Payer: Medicaid Other | Attending: Emergency Medicine | Admitting: Emergency Medicine

## 2022-02-10 ENCOUNTER — Encounter (HOSPITAL_COMMUNITY): Payer: Self-pay

## 2022-02-10 DIAGNOSIS — F10921 Alcohol use, unspecified with intoxication delirium: Secondary | ICD-10-CM | POA: Insufficient documentation

## 2022-02-10 DIAGNOSIS — F17210 Nicotine dependence, cigarettes, uncomplicated: Secondary | ICD-10-CM | POA: Insufficient documentation

## 2022-02-10 DIAGNOSIS — Z1152 Encounter for screening for COVID-19: Secondary | ICD-10-CM | POA: Diagnosis not present

## 2022-02-10 DIAGNOSIS — F1910 Other psychoactive substance abuse, uncomplicated: Secondary | ICD-10-CM | POA: Diagnosis present

## 2022-02-10 DIAGNOSIS — F19921 Other psychoactive substance use, unspecified with intoxication with delirium: Secondary | ICD-10-CM | POA: Insufficient documentation

## 2022-02-10 DIAGNOSIS — Y906 Blood alcohol level of 120-199 mg/100 ml: Secondary | ICD-10-CM | POA: Diagnosis not present

## 2022-02-10 LAB — COMPREHENSIVE METABOLIC PANEL
ALT: 21 U/L (ref 0–44)
AST: 24 U/L (ref 15–41)
Albumin: 4.2 g/dL (ref 3.5–5.0)
Alkaline Phosphatase: 79 U/L (ref 38–126)
Anion gap: 12 (ref 5–15)
BUN: 10 mg/dL (ref 6–20)
CO2: 18 mmol/L — ABNORMAL LOW (ref 22–32)
Calcium: 9.2 mg/dL (ref 8.9–10.3)
Chloride: 109 mmol/L (ref 98–111)
Creatinine, Ser: 0.81 mg/dL (ref 0.44–1.00)
GFR, Estimated: >60 mL/min (ref >60)
Glucose, Bld: 99 mg/dL (ref 70–99)
Potassium: 3.4 mmol/L — ABNORMAL LOW (ref 3.5–5.1)
Sodium: 139 mmol/L (ref 135–145)
Total Bilirubin: 0.5 mg/dL (ref 0.3–1.2)
Total Protein: 8.2 g/dL — ABNORMAL HIGH (ref 6.5–8.1)

## 2022-02-10 LAB — CBC WITH DIFFERENTIAL/PLATELET
Abs Immature Granulocytes: 0.05 10*3/uL (ref 0.00–0.07)
Basophils Absolute: 0.1 10*3/uL (ref 0.0–0.1)
Basophils Relative: 0 %
Eosinophils Absolute: 0 10*3/uL (ref 0.0–0.5)
Eosinophils Relative: 0 %
HCT: 44.1 % (ref 36.0–46.0)
Hemoglobin: 13.8 g/dL (ref 12.0–15.0)
Immature Granulocytes: 0 %
Lymphocytes Relative: 16 %
Lymphs Abs: 2.1 10*3/uL (ref 0.7–4.0)
MCH: 28.9 pg (ref 26.0–34.0)
MCHC: 31.3 g/dL (ref 30.0–36.0)
MCV: 92.3 fL (ref 80.0–100.0)
Monocytes Absolute: 0.6 10*3/uL (ref 0.1–1.0)
Monocytes Relative: 5 %
Neutro Abs: 10.7 10*3/uL — ABNORMAL HIGH (ref 1.7–7.7)
Neutrophils Relative %: 79 %
Platelets: 328 10*3/uL (ref 150–400)
RBC: 4.78 MIL/uL (ref 3.87–5.11)
RDW: 14.6 % (ref 11.5–15.5)
WBC: 13.6 10*3/uL — ABNORMAL HIGH (ref 4.0–10.5)
nRBC: 0 % (ref 0.0–0.2)

## 2022-02-10 LAB — RAPID URINE DRUG SCREEN, HOSP PERFORMED
Amphetamines: POSITIVE — AB
Barbiturates: NOT DETECTED
Benzodiazepines: NOT DETECTED
Cocaine: POSITIVE — AB
Opiates: NOT DETECTED
Tetrahydrocannabinol: POSITIVE — AB

## 2022-02-10 LAB — RESP PANEL BY RT-PCR (FLU A&B, COVID) ARPGX2
Influenza A by PCR: NEGATIVE
Influenza B by PCR: NEGATIVE
SARS Coronavirus 2 by RT PCR: NEGATIVE

## 2022-02-10 LAB — PREGNANCY, URINE: Preg Test, Ur: NEGATIVE

## 2022-02-10 LAB — ETHANOL: Alcohol, Ethyl (B): 164 mg/dL — ABNORMAL HIGH (ref <10)

## 2022-02-10 LAB — SALICYLATE LEVEL: Salicylate Lvl: <7 mg/dL — ABNORMAL LOW (ref 7.0–30.0)

## 2022-02-10 LAB — ACETAMINOPHEN LEVEL: Acetaminophen (Tylenol), Serum: <10 ug/mL — ABNORMAL LOW (ref 10–30)

## 2022-02-10 MED ORDER — ZIPRASIDONE MESYLATE 20 MG IM SOLR: 20.0000 mg | Freq: Once | INTRAMUSCULAR | Status: DC

## 2022-02-10 MED ORDER — ZIPRASIDONE MESYLATE 20 MG IM SOLR
20.0000 mg | Freq: Once | INTRAMUSCULAR | Status: AC
  Administered 2022-02-10: 20 mg via INTRAMUSCULAR

## 2022-02-10 MED ORDER — STERILE WATER FOR INJECTION IJ SOLN
INTRAMUSCULAR | Status: AC
  Filled 2022-02-10: qty 10

## 2022-02-10 MED ORDER — ZIPRASIDONE MESYLATE 20 MG IM SOLR
20.0000 mg | Freq: Once | INTRAMUSCULAR | Status: DC | PRN
  Filled 2022-02-10: qty 20

## 2022-02-10 MED ORDER — DROPERIDOL 2.5 MG/ML IJ SOLN
2.5000 mg | Freq: Once | INTRAMUSCULAR | Status: DC
  Filled 2022-02-10: qty 2

## 2022-02-10 NOTE — ED Provider Notes (Addendum)
[facility] Provider Note: Ellen Dell, MD, FACEP  CSN: [id number] MRN: [id number] ARRIVAL: 02/10/22 at 0043 ROOM: [facility]   CHIEF COMPLAINT  Drug Problem  5 caveat: Uncooperative, verbally abusive HISTORY OF PRESENT ILLNESS  02/10/22 12:54 AM Ellen Camacho is a 36 y.o. female brought by EMS after a friend with whom she was living called 911 because of erratic behavior.  The patient admits to consuming alcohol and cocaine and "other drugs".  She denies any attempt to harm herself.  She has had a labile mood, alternatively cooperative and combative.   EMS EKG shows sinus tachycardia, QTc 426:      Past Medical History:  Diagnosis Date   Anemia    Headache    HSV infection    Seasonal allergies    STD (female)     Past Surgical History:  Procedure Laterality Date   CESAREAN SECTION  01/06/2021   Procedure: CESAREAN SECTION;  Surgeon: Warden Fillers, MD;  Location: [facility];  Service: Obstetrics;;   NO PAST SURGERIES      Family History  Problem Relation Age of Onset   Diabetes Mother    Hypertension Mother    Heart disease Mother    Healthy Father    Stroke Brother    Cancer Maternal Grandfather     Social History   Tobacco Use   Smoking status: Every Day    Packs/day: 1.00    Types: Cigarettes    Last attempt to quit: 07/21/2014    Years since quitting: 7.5   Smokeless tobacco: Never  Vaping Use   Vaping Use: Never used  Substance Use Topics   Alcohol use: Yes    Comment: occ   Drug use: Yes    Types: Cocaine    Prior to Admission medications   Medication Sig Start Date End Date Taking? Authorizing Provider  Etonogestrel (NEXPLANON [place]) Inject into the skin.    [provider]  triamcinolone cream (KENALOG) 0.1 % Apply 1 application  topically 2 (two) times daily. 08/23/21   Raspet, Noberto Retort, PA-C    Allergies Patient has no known allergies.   REVIEW OF SYSTEMS  Negative except as noted here or in the History of  Present Illness.   PHYSICAL EXAMINATION  Initial Vital Signs Blood pressure (!) 139/110, pulse 93, resp. rate 16, SpO2 100 %, not currently breastfeeding.  Examination General: Well-developed, cachectic female in no acute distress; appears older than age of record HENT: normocephalic; atraumatic Eyes: Normal appearance Neck: supple Heart: regular rate and rhythm Lungs: Normal respiratory effort and excursion Abdomen: soft; nondistended Extremities: No deformity; full range of motion Neurologic: Awake, alert; motor function intact in all extremities and symmetric; no facial droop Skin: Warm and dry Psychiatric: Agitated, verbally abusive, uncooperative   RESULTS  Summary of this visit's results, reviewed and interpreted by myself:   EKG Interpretation  Date/Time:    Ventricular Rate:    PR Interval:    QRS Duration:   QT Interval:    QTC Calculation:   R Axis:     Text Interpretation:         Laboratory Studies: Results for orders placed or performed during the hospital encounter of 02/10/22 (from the past 24 hour(s))  CBC with Differential     Status: Abnormal   Collection Time: 02/10/22  1:02 AM  Result Value Ref Range   WBC 13.6 (H) 4.0 - 10.5 K/uL   RBC 4.78 3.87 - 5.11 MIL/uL  Hemoglobin 13.8 12.0 - 15.0 g/dL   HCT 16.0 10.9 - 32.3 %   MCV 92.3 80.0 - 100.0 fL   MCH 28.9 26.0 - 34.0 pg   MCHC 31.3 30.0 - 36.0 g/dL   RDW 55.7 32.2 - 02.5 %   Platelets 328 150 - 400 K/uL   nRBC 0.0 0.0 - 0.2 %   Neutrophils Relative % 79 %   Neutro Abs 10.7 (H) 1.7 - 7.7 K/uL   Lymphocytes Relative 16 %   Lymphs Abs 2.1 0.7 - 4.0 K/uL   Monocytes Relative 5 %   Monocytes Absolute 0.6 0.1 - 1.0 K/uL   Eosinophils Relative 0 %   Eosinophils Absolute 0.0 0.0 - 0.5 K/uL   Basophils Relative 0 %   Basophils Absolute 0.1 0.0 - 0.1 K/uL   Immature Granulocytes 0 %   Abs Immature Granulocytes 0.05 0.00 - 0.07 K/uL  Comprehensive metabolic panel     Status: Abnormal    Collection Time: 02/10/22  1:02 AM  Result Value Ref Range   Sodium 139 135 - 145 mmol/L   Potassium 3.4 (L) 3.5 - 5.1 mmol/L   Chloride 109 98 - 111 mmol/L   CO2 18 (L) 22 - 32 mmol/L   Glucose, Bld 99 70 - 99 mg/dL   BUN 10 6 - 20 mg/dL   Creatinine, Ser 4.27 0.44 - 1.00 mg/dL   Calcium 9.2 8.9 - 06.2 mg/dL   Total Protein 8.2 (H) 6.5 - 8.1 g/dL   Albumin 4.2 3.5 - 5.0 g/dL   AST 24 15 - 41 U/L   ALT 21 0 - 44 U/L   Alkaline Phosphatase 79 38 - 126 U/L   Total Bilirubin 0.5 0.3 - 1.2 mg/dL   GFR, Estimated >37 >62 mL/min   Anion gap 12 5 - 15  Ethanol     Status: Abnormal   Collection Time: 02/10/22  1:02 AM  Result Value Ref Range   Alcohol, Ethyl (B) 164 (H) <10 mg/dL  Salicylate level     Status: Abnormal   Collection Time: 02/10/22  1:02 AM  Result Value Ref Range   Salicylate Lvl <7.0 (L) 7.0 - 30.0 mg/dL  Acetaminophen level     Status: Abnormal   Collection Time: 02/10/22  1:02 AM  Result Value Ref Range   Acetaminophen (Tylenol), Serum <10 (L) 10 - 30 ug/mL  Rapid urine drug screen (hospital performed)     Status: Abnormal   Collection Time: 02/10/22  1:11 AM  Result Value Ref Range   Opiates NONE DETECTED NONE DETECTED   Cocaine POSITIVE (A) NONE DETECTED   Benzodiazepines NONE DETECTED NONE DETECTED   Amphetamines POSITIVE (A) NONE DETECTED   Tetrahydrocannabinol POSITIVE (A) NONE DETECTED   Barbiturates NONE DETECTED NONE DETECTED  Pregnancy, urine     Status: None   Collection Time: 02/10/22  4:29 AM  Result Value Ref Range   Preg Test, Ur NEGATIVE NEGATIVE  Resp Panel by RT-PCR (Flu A&B, Covid)     Status: None   Collection Time: 02/10/22  6:30 AM   Specimen: Nasal Swab  Result Value Ref Range   SARS Coronavirus 2 by RT PCR NEGATIVE NEGATIVE   Influenza A by PCR NEGATIVE NEGATIVE   Influenza B by PCR NEGATIVE NEGATIVE   Imaging Studies: No results found.  ED COURSE and MDM  Nursing notes, initial and subsequent vitals signs, including pulse  oximetry, reviewed and interpreted by myself.  Vitals:   02/10/22 0050 02/10/22  0358 02/10/22 0421 02/10/22 0647  BP: (!) 139/110  (!) 90/55 107/79  Pulse: 93 88 86 71  Resp: 16 19 16 14   Temp:    98.1 F (36.7 C)  TempSrc:    Oral  SpO2: 100% 100% 100% 100%   Medications  sterile water (preservative free) injection (  Given 02/10/22 0203)  ziprasidone (GEODON) injection 20 mg (20 mg Intramuscular Given 02/10/22 0140)   1:01 AM Patient now tearful but allowing nurse to draw blood.   2:20 AM Patient sleeping peacefully after IM Geodon.  3:08 AM Patient positive for cocaine, amphetamines, THC and an alcohol level of 164.  7:08 AM Patient now awake, ambulated to bathroom without any difficulty.  She is pleasant and cooperative and apologetic for earlier behavior.   PROCEDURES  Procedures   ED DIAGNOSES     ICD-10-CM   1. Polysubstance abuse (HCC)  F19.10     2. Acute drug intoxication with delirium (HCC)  F19.921     3. Acute alcohol intoxication, with delirium (HCC)  F10.921          14/1/23, MD 02/10/22 0710    14/01/23, MD 02/10/22 [phone]

## 2022-02-10 NOTE — ED Triage Notes (Signed)
Pt BIB EMS with reports of cocaine use and alcohol. Pt states that she needs a mental health evaluation.

## 2022-02-10 NOTE — ED Notes (Signed)
Pt connected to cardiac monitor and pulse ox. Pt responds to voice and reluctantly cooperates when asked to change position in bed, allow [place] to put pulse ox monitor on finger, etc.

## 2022-02-10 NOTE — ED Notes (Signed)
Pt uncooperative and tried to leave facility, pulled IV out and would not change into scrubs, security called to assist and pt agreed to change into burgundy scrubs. PRN medication given and belongings labeled and placed at nurses station

## 2022-02-10 NOTE — ED Notes (Signed)
Called patients ride and unable to get ahold of ride. Bus pass provided, patients clothes had vomit on them and didn't have socks or shoes.  Clothes and socks provided

## 2022-02-10 NOTE — ED Notes (Signed)
Pt changed into burgundy scrubs. Pt was verbally aggressive/hostile during attempts to change into scrubs. Pt uncooperative, including throwing personal clothing at staff while attempting to collect. Personal items collected and placed into labeled belongings bag, including shirt, shorts, pants, socks, black mobile phone (screen is cracked/shattered). Bag placed into pt cabinet at nurses station in [facility]16[facility]22 area. RN advised. [employer]

## 2022-02-10 NOTE — ED Notes (Addendum)
Pt was uncooperative when trying to get temperature.

## 2022-05-12 ENCOUNTER — Encounter (HOSPITAL_COMMUNITY): Payer: Self-pay | Admitting: Emergency Medicine

## 2022-05-12 ENCOUNTER — Ambulatory Visit (HOSPITAL_COMMUNITY)
Admission: EM | Admit: 2022-05-12 | Discharge: 2022-05-12 | Disposition: A | Discharge status: Home / Self Care | Payer: Medicaid Other | Attending: Internal Medicine | Admitting: Internal Medicine

## 2022-05-12 DIAGNOSIS — Z202 Contact with and (suspected) exposure to infections with a predominantly sexual mode of transmission: Secondary | ICD-10-CM | POA: Insufficient documentation

## 2022-05-12 DIAGNOSIS — R350 Frequency of micturition: Secondary | ICD-10-CM | POA: Insufficient documentation

## 2022-05-12 LAB — POCT URINALYSIS DIPSTICK, ED / UC
Bilirubin Urine: NEGATIVE
Glucose, UA: NEGATIVE mg/dL
Hgb urine dipstick: NEGATIVE
Leukocytes,Ua: NEGATIVE
Nitrite: NEGATIVE
Protein, ur: 30 mg/dL — AB
Specific Gravity, Urine: 1.02 (ref 1.005–1.030)
Urobilinogen, UA: 0.2 mg/dL (ref 0.0–1.0)
pH: 7 (ref 5.0–8.0)

## 2022-05-12 LAB — HIV ANTIBODY (ROUTINE TESTING W REFLEX): HIV Screen 4th Generation wRfx: NONREACTIVE

## 2022-05-12 NOTE — ED Provider Notes (Signed)
[facility]    CSN: [id number] Arrival date & time: 05/12/22  1810      History   Chief Complaint Chief Complaint  Patient presents with   SEXUALLY TRANSMITTED DISEASE    HPI Ellen Camacho is a 37 y.o. female.   Patient presents urgent care for evaluation of thin white vaginal discharge for the last "several days" and concern for STD.  Denies vaginal itching but reports some vaginal odor.  Also reports urinary frequency and slight dysuria without any urinary urgency or hesitancy.  No fever, chills, nausea, vomiting, abdominal pain, dizziness, headache, back pain, or constipation/diarrhea.  No known exposure to STD.  Denies recent antibiotic or steroid use. Denies vaginal rash. Denies frequent intake of urinary irritants.      Past Medical History:  Diagnosis Date   Anemia    Headache    HSV infection    Seasonal allergies    STD (female)     Patient Active Problem List   Diagnosis Date Noted   Normal labor 01/06/2021   H/O cesarean section 01/06/2021   Supervision of high risk pregnancy, antepartum 11/16/2020   Vaginal high risk HPV DNA test positive 11/21/2014   Limited prenatal care, antepartum 10/31/2014    Past Surgical History:  Procedure Laterality Date   CESAREAN SECTION  01/06/2021   Procedure: CESAREAN SECTION;  Surgeon: Griffin Basil, MD;  Location: [facility];  Service: Obstetrics;;   NO PAST SURGERIES      OB History     Gravida  7   Para  6   Term  5   Preterm  1   AB  1   Living  7      SAB  1   IAB  0   Ectopic  0   Multiple  1   Live Births  7            Home Medications    Prior to Admission medications   Medication Sig Start Date End Date Taking? Authorizing Provider  Etonogestrel (NEXPLANON [place]) Inject into the skin.    [provider]  triamcinolone cream (KENALOG) 0.1 % Apply 1 application  topically 2 (two) times daily. 08/23/21   Raspet, Derry Skill, PA-C    Family History Family History   Problem Relation Age of Onset   Diabetes Mother    Hypertension Mother    Heart disease Mother    Healthy Father    Stroke Brother    Cancer Maternal Grandfather     Social History Social History   Tobacco Use   Smoking status: Every Day    Packs/day: 1.00    Types: Cigarettes    Last attempt to quit: 07/21/2014    Years since quitting: 7.8   Smokeless tobacco: Never  Vaping Use   Vaping Use: Never used  Substance Use Topics   Alcohol use: Yes    Comment: occ   Drug use: Yes    Types: Cocaine     Allergies   Patient has no known allergies.   Review of Systems Review of Systems Per HPI  Physical Exam Triage Vital Signs ED Triage Vitals [05/12/22 1828]  Enc Vitals Group     BP 103/70     Pulse Rate 72     Resp 15     Temp 98.3 F (36.8 C)     Temp src      SpO2 98 %     Weight  Height      Head Circumference      Peak Flow      Pain Score 5     Pain Loc      Pain Edu?      Excl. in [place]?    No data found.  Updated Vital Signs BP 103/70 (BP Location: Left Arm)   Pulse 72   Temp 98.3 F (36.8 C)   Resp 15   LMP 04/13/2022   SpO2 98%   Visual Acuity Right Eye Distance:   Left Eye Distance:   Bilateral Distance:    Right Eye Near:   Left Eye Near:    Bilateral Near:     Physical Exam Vitals and nursing note reviewed.  Constitutional:      Appearance: She is not ill-appearing or toxic-appearing.  HENT:     Head: Normocephalic and atraumatic.     Right Ear: Hearing and external ear normal.     Left Ear: Hearing and external ear normal.     Nose: Nose normal.     Mouth/Throat:     Lips: Pink.  Eyes:     General: Lids are normal. Vision grossly intact. Gaze aligned appropriately.     Extraocular Movements: Extraocular movements intact.     Conjunctiva/sclera: Conjunctivae normal.  Pulmonary:     Effort: Pulmonary effort is normal.  Abdominal:     General: Abdomen is flat. Bowel sounds are normal.     Palpations: Abdomen is soft.      Tenderness: There is no abdominal tenderness. There is no right CVA tenderness or left CVA tenderness.  Musculoskeletal:     Cervical back: Neck supple.  Skin:    General: Skin is warm and dry.     Capillary Refill: Capillary refill takes less than 2 seconds.     Findings: No rash.  Neurological:     General: No focal deficit present.     Mental Status: She is alert and oriented to person, place, and time. Mental status is at baseline.     Cranial Nerves: No dysarthria or facial asymmetry.  Psychiatric:        Mood and Affect: Mood normal.        Speech: Speech normal.        Behavior: Behavior normal.        Thought Content: Thought content normal.        Judgment: Judgment normal.      UC Treatments / Results  Labs (all labs ordered are listed, but only abnormal results are displayed) Labs Reviewed  POCT URINALYSIS DIPSTICK, ED / UC - Abnormal; Notable for the following components:      Result Value   Ketones, ur TRACE (*)    Protein, ur 30 (*)    All other components within normal limits  HIV ANTIBODY (ROUTINE TESTING W REFLEX)  RPR  CERVICOVAGINAL ANCILLARY ONLY    EKG   Radiology No results found.  Procedures Procedures (including critical care time)  Medications Ordered in UC Medications - No data to display  Initial Impression / Assessment and Plan / UC Course  I have reviewed the triage vital signs and the nursing notes.  Pertinent labs & imaging results that were available during my care of the patient were reviewed by me and considered in my medical decision making (see chart for details).   1. Possible exposure to STD, urinary frequency STI labs pending.  Patient would like HIV and syphilis testing today.  Will notify  patient of positive results and treat accordingly when labs come back.  Patient to avoid sexual intercourse until screening testing comes back.  Education provided regarding safe sexual practices and patient encouraged to use  protection to prevent spread of STIs.   Urinalysis is unremarkable for signs of UTI. Advised to continue to push fluids.  Discussed physical exam and available lab work findings in clinic with patient.  Counseled patient regarding appropriate use of medications and potential side effects for all medications recommended or prescribed today. Discussed red flag signs and symptoms of worsening condition,when to call the PCP office, return to urgent care, and when to seek higher level of care in the emergency department. Patient verbalizes understanding and agreement with plan. All questions answered. Patient discharged in stable condition.    Final Clinical Impressions(s) / UC Diagnoses   Final diagnoses:  Possible exposure to STD  Urinary frequency     Discharge Instructions      Your STD testing has been sent to the lab and will come back in the next 2 to 3 days.  We will call you if any of your results are positive requiring treatment and treat you at that time.   If you do not receive a phone call from [place], this means your testing was negative.  Avoid sexual intercourse until your STD results come back.  If any of your STD results are positive, you will need to avoid sexual intercourse for 7 days while you are being treated to prevent spread of STD.  Condom use is the best way to prevent spread of STDs.  Return to urgent care as needed.    ED Prescriptions   None    PDMP not reviewed this encounter.   Talbot Grumbling,  05/12/22 1916

## 2022-05-12 NOTE — Discharge Instructions (Addendum)
Your STD testing has been sent to the lab and will come back in the next 2 to 3 days.  We will call you if any of your results are positive requiring treatment and treat you at that time.   If you do not receive a phone call from [place], this means your testing was negative.  Avoid sexual intercourse until your STD results come back.  If any of your STD results are positive, you will need to avoid sexual intercourse for 7 days while you are being treated to prevent spread of STD.  Condom use is the best way to prevent spread of STDs.  Return to urgent care as needed.

## 2022-05-12 NOTE — ED Triage Notes (Signed)
Pt reports having vaginal discharge that is heavy for a week with light odor and some abd pains. Wants STD testing

## 2022-05-13 LAB — RPR: RPR Ser Ql: NONREACTIVE

## 2022-05-15 LAB — CERVICOVAGINAL ANCILLARY ONLY
Bacterial Vaginitis (gardnerella): POSITIVE — AB
Candida Glabrata: NEGATIVE
Candida Vaginitis: NEGATIVE
Chlamydia: NEGATIVE
Comment: NEGATIVE
Comment: NEGATIVE
Comment: NEGATIVE
Comment: NEGATIVE
Comment: NEGATIVE
Comment: NORMAL
Neisseria Gonorrhea: NEGATIVE
Trichomonas: NEGATIVE

## 2022-05-16 ENCOUNTER — Telehealth (HOSPITAL_COMMUNITY): Payer: Self-pay | Admitting: Emergency Medicine

## 2022-05-16 MED ORDER — METRONIDAZOLE 500 MG PO TABS: 500.0000 mg | ORAL_TABLET | Freq: Two times a day (BID) | ORAL | 0 refills | Status: DC

## 2023-01-10 DIAGNOSIS — F1090 Alcohol use, unspecified, uncomplicated: Secondary | ICD-10-CM | POA: Diagnosis not present

## 2023-06-15 ENCOUNTER — Ambulatory Visit (HOSPITAL_COMMUNITY)
Admission: EM | Admit: 2023-06-15 | Discharge: 2023-06-15 | Disposition: A | Discharge status: Home / Self Care | Payer: MEDICAID | Attending: Emergency Medicine | Admitting: Emergency Medicine

## 2023-06-15 ENCOUNTER — Encounter (HOSPITAL_COMMUNITY): Payer: Self-pay | Admitting: *Deleted

## 2023-06-15 DIAGNOSIS — Z3202 Encounter for pregnancy test, result negative: Secondary | ICD-10-CM | POA: Diagnosis not present

## 2023-06-15 DIAGNOSIS — N10 Acute tubulo-interstitial nephritis: Secondary | ICD-10-CM

## 2023-06-15 LAB — POCT URINALYSIS DIP (MANUAL ENTRY)
Glucose, UA: NEGATIVE mg/dL
Nitrite, UA: POSITIVE — AB
Protein Ur, POC: 100 mg/dL — AB
Spec Grav, UA: 1.025 (ref 1.010–1.025)
Urobilinogen, UA: 1 U/dL
pH, UA: 5.5 (ref 5.0–8.0)

## 2023-06-15 LAB — POCT URINE PREGNANCY: Preg Test, Ur: NEGATIVE

## 2023-06-15 LAB — POC COVID19/FLU A&B COMBO
Covid Antigen, POC: NEGATIVE
Influenza A Antigen, POC: NEGATIVE
Influenza B Antigen, POC: NEGATIVE

## 2023-06-15 MED ORDER — CIPROFLOXACIN HCL 500 MG PO TABS: 500.0000 mg | ORAL_TABLET | Freq: Two times a day (BID) | ORAL | 0 refills | Status: AC

## 2023-06-15 MED ORDER — CEFTRIAXONE SODIUM 1 G IJ SOLR
0.5000 g | Freq: Once | INTRAMUSCULAR | Status: AC
  Administered 2023-06-15: 0.5 g via INTRAMUSCULAR

## 2023-06-15 MED ORDER — KETOROLAC TROMETHAMINE 60 MG/2ML IM SOLN
30.0000 mg | Freq: Once | INTRAMUSCULAR | Status: AC
Start: 2023-06-15 — End: 2023-06-15
  Administered 2023-06-15: 30 mg via INTRAMUSCULAR

## 2023-06-15 MED ORDER — CEFTRIAXONE SODIUM 500 MG IJ SOLR
INTRAMUSCULAR | Status: AC
  Filled 2023-06-15: qty 500

## 2023-06-15 MED ORDER — KETOROLAC TROMETHAMINE 30 MG/ML IJ SOLN
INTRAMUSCULAR | Status: AC
  Filled 2023-06-15: qty 1

## 2023-06-15 MED ORDER — LIDOCAINE HCL (PF) 1 % IJ SOLN
INTRAMUSCULAR | Status: AC
  Filled 2023-06-15: qty 2

## 2023-06-15 NOTE — ED Triage Notes (Signed)
 Pt states yesterday she developed a fever, body aches, and a tingling when she urinates. She states she took some tylenol PM this morning at 7am since its all she had.

## 2023-06-15 NOTE — ED Provider Notes (Addendum)
 [facility]    CSN: [id number] Arrival date & time: 06/15/23  1139      History   Chief Complaint Chief Complaint  Patient presents with   Generalized Body Aches   Fever   Dysuria    HPI NORETA KUE is a 38 y.o. female.   Patient presents to clinic over concerns of fever, generalized bodyaches, mild cough, congestion, headache and dysuria.  Symptoms started yesterday.  Denies hematuria, flank pain, nausea or vomiting.  She did take Tylenol PM late last night with minor improvement.  She does have children, they have not been sick recently.  Last menstrual cycle 2 weeks ago.  The history is provided by the patient and medical records.  Fever Dysuria   Past Medical History:  Diagnosis Date   Anemia    Headache    HSV infection    Seasonal allergies    STD (female)     Patient Active Problem List   Diagnosis Date Noted   Normal labor 01/06/2021   H/O cesarean section 01/06/2021   Supervision of high risk pregnancy, antepartum 11/16/2020   Vaginal high risk HPV DNA test positive 11/21/2014   Limited prenatal care, antepartum 10/31/2014    Past Surgical History:  Procedure Laterality Date   CESAREAN SECTION  01/06/2021   Procedure: CESAREAN SECTION;  Surgeon: Warden Fillers, MD;  Location: [facility];  Service: Obstetrics;;   NO PAST SURGERIES      OB History     Gravida  7   Para  6   Term  5   Preterm  1   AB  1   Living  7      SAB  1   IAB  0   Ectopic  0   Multiple  1   Live Births  7            Home Medications    Prior to Admission medications   Medication Sig Start Date End Date Taking? Authorizing Provider  ciprofloxacin (CIPRO) 500 MG tablet Take 1 tablet (500 mg total) by mouth every 12 (twelve) hours for 5 days. 06/15/23 06/20/23 Yes Rinaldo Ratel, [place] N, FNP  Etonogestrel [facility]) Inject into the skin.    [provider]  metroNIDAZOLE (FLAGYL) 500 MG tablet Take 1 tablet (500 mg total) by  mouth 2 (two) times daily. 05/16/22   LampteyBritta Mccreedy, MD  triamcinolone cream (KENALOG) 0.1 % Apply 1 application  topically 2 (two) times daily. 08/23/21   Raspet, Noberto Retort, PA-C    Family History Family History  Problem Relation Age of Onset   Diabetes Mother    Hypertension Mother    Heart disease Mother    Healthy Father    Stroke Brother    Cancer Maternal Grandfather     Social History Social History   Tobacco Use   Smoking status: Every Day    Current packs/day: 0.00    Types: Cigarettes    Last attempt to quit: 07/21/2014    Years since quitting: 8.9   Smokeless tobacco: Never  Vaping Use   Vaping status: Never Used  Substance Use Topics   Alcohol use: Yes    Comment: occ   Drug use: Yes    Types: Cocaine, Marijuana     Allergies   Patient has no known allergies.   Review of Systems Review of Systems  Per HPI  Physical Exam Triage Vital Signs ED Triage Vitals  Encounter Vitals Group  BP 06/15/23 1254 111/75     Systolic BP Percentile --      Diastolic BP Percentile --      Pulse Rate 06/15/23 1254 97     Resp 06/15/23 1254 18     Temp 06/15/23 1254 99.9 F (37.7 C)     Temp Source 06/15/23 1254 Oral     SpO2 06/15/23 1254 99 %     Weight --      Height --      Head Circumference --      Peak Flow --      Pain Score 06/15/23 1252 9     Pain Loc --      Pain Education --      Exclude from Growth Chart --    No data found.  Updated Vital Signs BP 111/75 (BP Location: Left Arm)   Pulse 97   Temp 99.9 F (37.7 C) (Oral)   Resp 18   LMP  (LMP Unknown) Comment: irregular due to implant  SpO2 99%   Visual Acuity Right Eye Distance:   Left Eye Distance:   Bilateral Distance:    Right Eye Near:   Left Eye Near:    Bilateral Near:     Physical Exam Vitals and nursing note reviewed.  Constitutional:      Appearance: Normal appearance.  HENT:     Head: Normocephalic and atraumatic.     Right Ear: External ear normal.     Left  Ear: External ear normal.     Nose: Nose normal.     Mouth/Throat:     Mouth: Mucous membranes are moist.  Eyes:     Conjunctiva/sclera: Conjunctivae normal.  Cardiovascular:     Rate and Rhythm: Normal rate and regular rhythm.  Pulmonary:     Effort: Pulmonary effort is normal. No respiratory distress.  Abdominal:     Tenderness: There is no right CVA tenderness or left CVA tenderness.  Neurological:     General: No focal deficit present.     Mental Status: She is alert.  Psychiatric:        Mood and Affect: Mood normal.      UC Treatments / Results  Labs (all labs ordered are listed, but only abnormal results are displayed) Labs Reviewed  POCT URINALYSIS DIP (MANUAL ENTRY) - Abnormal; Notable for the following components:      Result Value   Color, UA orange (*)    Clarity, UA cloudy (*)    Bilirubin, UA small (*)    Ketones, POC UA small (15) (*)    Blood, UA small (*)    Protein Ur, POC =100 (*)    Nitrite, UA Positive (*)    Leukocytes, UA Trace (*)    All other components within normal limits  POC COVID19/FLU A&B COMBO  POCT URINE PREGNANCY    EKG   Radiology No results found.  Procedures Procedures (including critical care time)  Medications Ordered in UC Medications  cefTRIAXone (ROCEPHIN) injection 0.5 g (has no administration in time range)  ketorolac (TORADOL) injection 30 mg (has no administration in time range)    Initial Impression / Assessment and Plan / UC Course  I have reviewed the triage vital signs and the nursing notes.  Pertinent labs & imaging results that were available during my care of the patient were reviewed by me and considered in my medical decision making (see chart for details).  Vitals in triage reviewed, patient is hemodynamically stable.  Pulse on recheck 88.  Negative for CVA tenderness.  Urinalysis concerning for UTI, with systemic symptoms will start with IM Rocephin and 5 days of Cipro for pyelonephritis coverage.  Urine sent for culture.  Toradol in clinic should help with fever and bodyaches.  Urine pregnancy negative.  Generalized bodyaches, fever, headache, congestion and chills consistent with viral URI.  COVID and flu testing negative.  While some symptoms may be related to viral URI, will continue with Cipro coverage for pyelonephritis.     Final Clinical Impressions(s) / UC Diagnoses   Final diagnoses:  Acute pyelonephritis     Discharge Instructions      COVID and flu testing are negative, while you still may have a viral infection on top of your urinary tract infection we are covering you with an intramuscular antibiotic as well as an oral antibiotic.  Take the oral antibiotic every 12 hours with food for the next 5 days.  Ensure you are drinking at least 64 ounces of water daily to help flush your kidneys.  We have given you Toradol in clinic to help with your pain.  If you have any pain or bodyaches tomorrow you can alternate between 800 mg of ibuprofen and 500 mg of Tylenol every 4-6 hours.  If you do not have any improvement over the next 72 hours on the antibiotics, continue with high fevers, or any new concerning symptoms seek follow-up care at the emergency department for advanced evaluation.      ED Prescriptions     Medication Sig Dispense Auth. Provider   ciprofloxacin (CIPRO) 500 MG tablet Take 1 tablet (500 mg total) by mouth every 12 (twelve) hours for 5 days. 10 tablet Hanadi Stanly, [place] N, FNP      PDMP not reviewed this encounter.       Lashya Passe, [place] N, [place] 06/15/23 [phone]

## 2023-06-15 NOTE — Discharge Instructions (Addendum)
 COVID and flu testing are negative, while you still may have a viral infection on top of your urinary tract infection we are covering you with an intramuscular antibiotic as well as an oral antibiotic.  Take the oral antibiotic every 12 hours with food for the next 5 days.  Ensure you are drinking at least 64 ounces of water daily to help flush your kidneys.  We have given you Toradol in clinic to help with your pain.  If you have any pain or bodyaches tomorrow you can alternate between 800 mg of ibuprofen and 500 mg of Tylenol every 4-6 hours.  If you do not have any improvement over the next 72 hours on the antibiotics, continue with high fevers, or any new concerning symptoms seek follow-up care at the emergency department for advanced evaluation.

## 2024-03-10 ENCOUNTER — Encounter (HOSPITAL_COMMUNITY): Payer: Self-pay | Admitting: Emergency Medicine

## 2024-03-10 ENCOUNTER — Ambulatory Visit (HOSPITAL_COMMUNITY)
Admission: EM | Admit: 2024-03-10 | Discharge: 2024-03-10 | Disposition: A | Discharge status: Home / Self Care | Payer: MEDICAID | Attending: Student | Admitting: Student

## 2024-03-10 DIAGNOSIS — Z202 Contact with and (suspected) exposure to infections with a predominantly sexual mode of transmission: Secondary | ICD-10-CM | POA: Diagnosis not present

## 2024-03-10 DIAGNOSIS — J069 Acute upper respiratory infection, unspecified: Secondary | ICD-10-CM | POA: Diagnosis not present

## 2024-03-10 MED ORDER — DOXYCYCLINE HYCLATE 100 MG PO CAPS: 100.0000 mg | ORAL_CAPSULE | Freq: Two times a day (BID) | ORAL | 0 refills | Status: AC

## 2024-03-10 NOTE — ED Triage Notes (Signed)
 Christmas eve started having cold chills, congestion, nausea and headaches took cold medications OTC.   Pt reports had exposure to chlamydia.

## 2024-03-10 NOTE — Discharge Instructions (Addendum)
-  Doxycycline  twice daily for 7 days.  Make sure to wear sunscreen while spending time outside while on this medication as it can increase your chance of sunburn. You can take this medication with food if you have a sensitive stomach. This is the treatment for chlamydia.  -We are testing for gonorrhea, chlamydia, trichomonas, BV, and yeast. -We will call with any positive results in about 3 business days and can send treatment if necessary. -We will not call with negative lab results. -Lab results will automatically go to your MyChart.

## 2024-03-10 NOTE — ED Provider Notes (Signed)
 " [facility]    CSN: [id number] Arrival date & time: 03/10/24  1505      History   Chief Complaint Chief Complaint  Patient presents with   Nausea   Headache   Exposure to STD    HPI Ellen Camacho is a 38 y.o. female Christmas eve 12/24 started having cold chills, congestion, nausea and headaches took cold medications OTC (cold & flu, which was effective; ginger booster; water ). States HA is most bothersome symptom today. Describes as throbbing pain radiating from occipital crown to forehead. States nasal congestion is getting a little thicker. Today is day 6.    Pt reports had exposure to chlamydia. Denies vaginal discharge, dysuria, frequency, abd pain. Nexplanon  contraception, and she is menstruating today.  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 Authorizing Provider  doxycycline  (VIBRAMYCIN ) 100 MG capsule Take 1 capsule (100 mg total) by mouth 2 (two) times daily for 7 days. 03/10/24 03/17/24 Yes Eldor Conaway E, PA-C  Etonogestrel  (NEXPLANON  [place])  Inject into the skin.    [provider]    Family History Family History  Problem Relation Age of Onset   Diabetes Mother    Hypertension Mother    Heart disease Mother    Healthy Father    Stroke Brother    Cancer Maternal Grandfather     Social History Social History[1]   Allergies   Patient has no known allergies.   Review of Systems Review of Systems  Constitutional:  Negative for appetite change, chills and fever.  HENT:  Positive for congestion. Negative for ear pain, rhinorrhea, sinus pressure, sinus pain and sore throat.   Eyes:  Negative for redness and visual disturbance.  Respiratory:  Positive for cough. Negative for chest tightness, shortness of breath and wheezing.   Cardiovascular:  Negative for chest pain and palpitations.  Gastrointestinal:  Positive for nausea. Negative for abdominal pain, constipation, diarrhea and vomiting.  Genitourinary:  Negative for dysuria, frequency and urgency.  Musculoskeletal:  Negative for myalgias.  Neurological:  Positive for headaches. Negative for dizziness and weakness.  Psychiatric/Behavioral:  Negative for confusion.   All other systems reviewed and are negative.    Physical Exam Triage Vital Signs ED  Triage Vitals  Encounter Vitals Group     BP      Girls Systolic BP Percentile      Girls Diastolic BP Percentile      Boys Systolic BP Percentile      Boys Diastolic BP Percentile      Pulse      Resp      Temp      Temp src      SpO2      Weight      Height      Head Circumference      Peak Flow      Pain Score      Pain Loc      Pain Education      Exclude from Growth Chart    No data found.  Updated Vital Signs BP 99/65 (BP Location: Right Arm)   Pulse 71   Temp 97.9 F (36.6 C) (Oral)   Resp 13   LMP 03/04/2024 (Approximate)   SpO2 97%   Visual Acuity Right Eye Distance:   Left Eye Distance:   Bilateral Distance:    Right Eye Near:   Left Eye Near:    Bilateral Near:      Physical Exam Vitals reviewed.  Constitutional:      General: She is not in acute distress.    Appearance: Normal appearance. She is not ill-appearing.  HENT:     Head: Normocephalic and atraumatic.     Right Ear: Tympanic membrane, ear canal and external ear normal. No tenderness. No middle ear effusion. There is no impacted cerumen. Tympanic membrane is not perforated, erythematous, retracted or bulging.     Left Ear: Tympanic membrane, ear canal and external ear normal. No tenderness.  No middle ear effusion. There is no impacted cerumen. Tympanic membrane is not perforated, erythematous, retracted or bulging.     Nose: Nose normal. No congestion.     Mouth/Throat:     Mouth: Mucous membranes are moist.     Pharynx: Uvula midline. No oropharyngeal exudate or posterior oropharyngeal erythema.     Tonsils: No tonsillar exudate.  Eyes:     Extraocular Movements: Extraocular movements intact.     Pupils: Pupils are equal, round, and reactive to light.  Cardiovascular:     Rate and Rhythm: Normal rate and regular rhythm.     Heart sounds: Normal heart sounds.  Pulmonary:     Effort: Pulmonary effort is normal.     Breath sounds: Normal breath sounds. No decreased breath sounds, wheezing, rhonchi or rales.  Abdominal:     Palpations: Abdomen is soft.     Tenderness: There is no abdominal tenderness. There is no guarding or rebound.  Lymphadenopathy:     Cervical: No cervical adenopathy.     Right cervical: No superficial, deep or posterior cervical adenopathy.    Left cervical: No superficial, deep or posterior cervical adenopathy.  Skin:    Comments: No rash   Neurological:     General: No focal deficit present.     Mental Status: She is alert and oriented to person, place, and time.  Psychiatric:        Mood and Affect: Mood normal.        Behavior: Behavior normal.        Thought Content: Thought content normal.        Judgment: Judgment normal.      UC Treatments /  Results  Labs (all labs ordered are listed, but  only abnormal results are displayed) Labs Reviewed  CERVICOVAGINAL ANCILLARY ONLY    EKG   Radiology No results found.  Procedures Procedures (including critical care time)  Medications Ordered in UC Medications - No data to display  Initial Impression / Assessment and Plan / UC Course  I have reviewed the triage vital signs and the nursing notes.  Pertinent labs & imaging results that were available during my care of the patient were reviewed by me and considered in my medical decision making (see chart for details).     Patient is a pleasant 38 y.o. female presenting on day 6 of viral syndrome, and chlamydia exposure. The patient is afebrile and nontachycardic.  Antipyretic has not been administered today. Nexplanon  contraception, and she is menstruating today. Progressing normally through stages of viral infection.   Did not check a COVID or influenza test due to duration of symptoms.  Chlamydia exposure. She is asymptomatic. Will send self-swab for G/C, trich, yeast, BV testing. Declines HIV, RPR. Safe sex precautions. Doxycycline  sent.   Final Clinical Impressions(s) / UC Diagnoses   Final diagnoses:  Exposure to chlamydia  Viral URI with cough     Discharge Instructions      -Doxycycline  twice daily for 7 days.  Make sure to wear sunscreen while spending time outside while on this medication as it can increase your chance of sunburn. You can take this medication with food if you have a sensitive stomach. This is the treatment for chlamydia.  -We are testing for gonorrhea, chlamydia, trichomonas, BV, and yeast. -We will call with any positive results in about 3 business days and can send treatment if necessary. -We will not call with negative lab results. -Lab results will automatically go to your MyChart.     ED Prescriptions     Medication Sig Dispense Auth. Provider   doxycycline  (VIBRAMYCIN ) 100 MG capsule  Take 1 capsule (100 mg total) by mouth 2 (two) times daily for 7 days. 14 capsule Kenneshia Rehm E, PA-C      PDMP not reviewed this encounter.     [1]  Social History Tobacco Use   Smoking status: Every Day    Current packs/day: 0.00    Average packs/day: 1.0 packs/day    Types: Cigarettes    Last attempt to quit: 07/21/2014    Years since quitting: 9.6   Smokeless tobacco: Never  Vaping Use   Vaping status: Never Used  Substance Use Topics   Alcohol use: Yes    Comment: occ   Drug use: Yes    Types: Cocaine, Marijuana     Arlyss Leita BRAVO, PA-C 03/10/24 1658  "

## 2024-03-11 LAB — CERVICOVAGINAL ANCILLARY ONLY
Bacterial Vaginitis (gardnerella): NEGATIVE
Candida Glabrata: NEGATIVE
Candida Vaginitis: NEGATIVE
Chlamydia: NEGATIVE
Comment: NEGATIVE
Comment: NEGATIVE
Comment: NEGATIVE
Comment: NEGATIVE
Comment: NEGATIVE
Comment: NORMAL
Neisseria Gonorrhea: NEGATIVE
Trichomonas: NEGATIVE
